# Patient Record
Sex: Female | Born: 1945 | Race: Black or African American | Hispanic: No | Smoking: Never smoker
Health system: Southern US, Community
[De-identification: ages and names within clinical notes are randomized; demographics above are authoritative.]

## PROBLEM LIST (undated history)

## (undated) DIAGNOSIS — M5136 Other intervertebral disc degeneration, lumbar region: Secondary | ICD-10-CM

## (undated) DIAGNOSIS — E785 Hyperlipidemia, unspecified: Secondary | ICD-10-CM

## (undated) DIAGNOSIS — M51369 Other intervertebral disc degeneration, lumbar region without mention of lumbar back pain or lower extremity pain: Secondary | ICD-10-CM

## (undated) DIAGNOSIS — J40 Bronchitis, not specified as acute or chronic: Secondary | ICD-10-CM

## (undated) DIAGNOSIS — R112 Nausea with vomiting, unspecified: Secondary | ICD-10-CM

## (undated) DIAGNOSIS — Z8719 Personal history of other diseases of the digestive system: Secondary | ICD-10-CM

## (undated) DIAGNOSIS — I2510 Atherosclerotic heart disease of native coronary artery without angina pectoris: Secondary | ICD-10-CM

## (undated) DIAGNOSIS — S86011A Strain of right Achilles tendon, initial encounter: Secondary | ICD-10-CM

## (undated) DIAGNOSIS — K219 Gastro-esophageal reflux disease without esophagitis: Secondary | ICD-10-CM

## (undated) DIAGNOSIS — N201 Calculus of ureter: Secondary | ICD-10-CM

## (undated) DIAGNOSIS — Z8679 Personal history of other diseases of the circulatory system: Secondary | ICD-10-CM

## (undated) DIAGNOSIS — Z9889 Other specified postprocedural states: Secondary | ICD-10-CM

## (undated) DIAGNOSIS — Q446 Cystic disease of liver: Secondary | ICD-10-CM

## (undated) DIAGNOSIS — N200 Calculus of kidney: Secondary | ICD-10-CM

## (undated) DIAGNOSIS — E119 Type 2 diabetes mellitus without complications: Secondary | ICD-10-CM

## (undated) DIAGNOSIS — M1990 Unspecified osteoarthritis, unspecified site: Secondary | ICD-10-CM

## (undated) DIAGNOSIS — I10 Essential (primary) hypertension: Secondary | ICD-10-CM

## (undated) DIAGNOSIS — M5416 Radiculopathy, lumbar region: Secondary | ICD-10-CM

## (undated) HISTORY — PX: CARDIAC CATHETERIZATION: SHX172

## (undated) HISTORY — DX: Unspecified osteoarthritis, unspecified site: M19.90

## (undated) HISTORY — DX: Atherosclerotic heart disease of native coronary artery without angina pectoris: I25.10

## (undated) HISTORY — PX: CARDIOVASCULAR STRESS TEST: SHX262

---

## 1976-03-21 HISTORY — PX: TOTAL ABDOMINAL HYSTERECTOMY W/ BILATERAL SALPINGOOPHORECTOMY: SHX83

## 1998-08-08 ENCOUNTER — Emergency Department (HOSPITAL_COMMUNITY): Admission: EM | Admit: 1998-08-08 | Discharge: 1998-08-08 | Payer: Self-pay | Admitting: *Deleted

## 1998-08-08 ENCOUNTER — Encounter: Payer: Self-pay | Admitting: *Deleted

## 1998-12-18 ENCOUNTER — Encounter: Payer: Self-pay | Admitting: Emergency Medicine

## 1998-12-18 ENCOUNTER — Inpatient Hospital Stay (HOSPITAL_COMMUNITY): Admission: EM | Admit: 1998-12-18 | Discharge: 1998-12-23 | Payer: Self-pay | Admitting: Emergency Medicine

## 1998-12-19 ENCOUNTER — Encounter: Payer: Self-pay | Admitting: Internal Medicine

## 1999-01-02 ENCOUNTER — Encounter: Payer: Self-pay | Admitting: Emergency Medicine

## 1999-01-02 ENCOUNTER — Emergency Department (HOSPITAL_COMMUNITY): Admission: EM | Admit: 1999-01-02 | Discharge: 1999-01-02 | Payer: Self-pay | Admitting: Emergency Medicine

## 1999-01-03 ENCOUNTER — Encounter: Payer: Self-pay | Admitting: Family Medicine

## 1999-01-19 ENCOUNTER — Inpatient Hospital Stay (HOSPITAL_COMMUNITY): Admission: AD | Admit: 1999-01-19 | Discharge: 1999-01-19 | Payer: Self-pay | Admitting: *Deleted

## 1999-01-20 ENCOUNTER — Emergency Department (HOSPITAL_COMMUNITY): Admission: EM | Admit: 1999-01-20 | Discharge: 1999-01-20 | Payer: Self-pay | Admitting: *Deleted

## 1999-10-27 ENCOUNTER — Encounter: Admission: RE | Admit: 1999-10-27 | Discharge: 1999-11-23 | Payer: Self-pay | Admitting: Family Medicine

## 1999-11-05 ENCOUNTER — Encounter: Payer: Self-pay | Admitting: Family Medicine

## 1999-11-05 ENCOUNTER — Inpatient Hospital Stay (HOSPITAL_COMMUNITY): Admission: AD | Admit: 1999-11-05 | Discharge: 1999-11-09 | Payer: Self-pay | Admitting: Family Medicine

## 1999-11-08 ENCOUNTER — Encounter: Payer: Self-pay | Admitting: Family Medicine

## 2000-03-25 ENCOUNTER — Encounter: Payer: Self-pay | Admitting: Emergency Medicine

## 2000-03-25 ENCOUNTER — Emergency Department (HOSPITAL_COMMUNITY): Admission: EM | Admit: 2000-03-25 | Discharge: 2000-03-25 | Payer: Self-pay | Admitting: Emergency Medicine

## 2001-01-21 ENCOUNTER — Encounter: Payer: Self-pay | Admitting: Emergency Medicine

## 2001-01-21 ENCOUNTER — Emergency Department (HOSPITAL_COMMUNITY): Admission: EM | Admit: 2001-01-21 | Discharge: 2001-01-21 | Payer: Self-pay | Admitting: *Deleted

## 2001-03-21 HISTORY — PX: CATARACT EXTRACTION W/ INTRAOCULAR LENS IMPLANT: SHX1309

## 2001-03-21 HISTORY — PX: HEEL SPUR SURGERY: SHX665

## 2001-04-22 ENCOUNTER — Inpatient Hospital Stay (HOSPITAL_COMMUNITY): Admission: EM | Admit: 2001-04-22 | Discharge: 2001-04-25 | Payer: Self-pay | Admitting: *Deleted

## 2001-04-22 ENCOUNTER — Encounter: Payer: Self-pay | Admitting: Internal Medicine

## 2001-04-25 ENCOUNTER — Encounter: Payer: Self-pay | Admitting: Internal Medicine

## 2002-05-28 ENCOUNTER — Encounter: Payer: Self-pay | Admitting: Family Medicine

## 2002-05-28 ENCOUNTER — Ambulatory Visit (HOSPITAL_COMMUNITY): Admission: RE | Admit: 2002-05-28 | Discharge: 2002-05-28 | Payer: Self-pay | Admitting: Family Medicine

## 2003-02-13 ENCOUNTER — Emergency Department (HOSPITAL_COMMUNITY): Admission: EM | Admit: 2003-02-13 | Discharge: 2003-02-13 | Payer: Self-pay | Admitting: Emergency Medicine

## 2004-02-06 ENCOUNTER — Ambulatory Visit: Payer: Self-pay | Admitting: Family Medicine

## 2004-05-06 ENCOUNTER — Emergency Department (HOSPITAL_COMMUNITY): Admission: EM | Admit: 2004-05-06 | Discharge: 2004-05-06 | Payer: Self-pay | Admitting: Emergency Medicine

## 2005-01-04 ENCOUNTER — Ambulatory Visit: Payer: Self-pay | Admitting: Family Medicine

## 2005-01-06 ENCOUNTER — Encounter: Admission: RE | Admit: 2005-01-06 | Discharge: 2005-01-06 | Payer: Self-pay | Admitting: Family Medicine

## 2005-01-13 ENCOUNTER — Encounter: Admission: RE | Admit: 2005-01-13 | Discharge: 2005-01-13 | Payer: Self-pay | Admitting: Orthopaedic Surgery

## 2005-10-13 ENCOUNTER — Ambulatory Visit: Payer: Self-pay | Admitting: Cardiology

## 2005-10-14 ENCOUNTER — Encounter: Payer: Self-pay | Admitting: Vascular Surgery

## 2005-10-14 ENCOUNTER — Inpatient Hospital Stay (HOSPITAL_COMMUNITY): Admission: EM | Admit: 2005-10-14 | Discharge: 2005-10-18 | Payer: Self-pay | Admitting: Family Medicine

## 2005-10-14 ENCOUNTER — Encounter: Payer: Self-pay | Admitting: Cardiology

## 2007-09-24 ENCOUNTER — Encounter (INDEPENDENT_AMBULATORY_CARE_PROVIDER_SITE_OTHER): Payer: Self-pay | Admitting: *Deleted

## 2009-07-14 ENCOUNTER — Inpatient Hospital Stay (HOSPITAL_COMMUNITY): Admission: AD | Admit: 2009-07-14 | Discharge: 2009-07-14 | Payer: Self-pay | Admitting: Obstetrics and Gynecology

## 2009-11-09 ENCOUNTER — Observation Stay (HOSPITAL_COMMUNITY): Admission: EM | Admit: 2009-11-09 | Discharge: 2009-11-11 | Payer: Self-pay | Admitting: Emergency Medicine

## 2009-11-10 ENCOUNTER — Ambulatory Visit: Payer: Self-pay | Admitting: Vascular Surgery

## 2009-11-10 ENCOUNTER — Encounter (INDEPENDENT_AMBULATORY_CARE_PROVIDER_SITE_OTHER): Payer: Self-pay | Admitting: Internal Medicine

## 2009-11-12 ENCOUNTER — Encounter: Payer: Self-pay | Admitting: Family Medicine

## 2010-02-07 ENCOUNTER — Emergency Department (HOSPITAL_COMMUNITY): Admission: EM | Admit: 2010-02-07 | Discharge: 2010-02-07 | Payer: Self-pay | Admitting: Family Medicine

## 2010-02-09 ENCOUNTER — Emergency Department (HOSPITAL_COMMUNITY): Admission: EM | Admit: 2010-02-09 | Discharge: 2010-02-09 | Payer: Self-pay | Admitting: Emergency Medicine

## 2010-02-26 ENCOUNTER — Emergency Department (HOSPITAL_COMMUNITY)
Admission: EM | Admit: 2010-02-26 | Discharge: 2010-02-26 | Payer: Self-pay | Source: Home / Self Care | Admitting: Emergency Medicine

## 2010-04-20 NOTE — Letter (Signed)
Summary: Encounter Notice/[facility]  Encounter [facility]   Imported By: Lanelle Bal 11/19/2009 12:17:08  _____________________________________________________________________  External Attachment:    Type:   Image     Comment:   External Document

## 2010-06-01 LAB — DIFFERENTIAL
Basophils Absolute: 0.1 10*3/uL (ref 0.0–0.1)
Basophils Relative: 1 % (ref 0–1)
Eosinophils Absolute: 0.2 10*3/uL (ref 0.0–0.7)
Eosinophils Relative: 3 % (ref 0–5)
Lymphocytes Relative: 53 % — ABNORMAL HIGH (ref 12–46)
Lymphs Abs: 3.9 10*3/uL (ref 0.7–4.0)
Monocytes Absolute: 0.5 10*3/uL (ref 0.1–1.0)
Monocytes Relative: 7 % (ref 3–12)
Neutro Abs: 2.6 10*3/uL (ref 1.7–7.7)
Neutrophils Relative %: 36 % — ABNORMAL LOW (ref 43–77)

## 2010-06-01 LAB — POCT I-STAT, CHEM 8
BUN: 12 mg/dL (ref 6–23)
Calcium, Ion: 1.15 mmol/L (ref 1.12–1.32)
Chloride: 110 mEq/L (ref 96–112)
Creatinine, Ser: 1.3 mg/dL — ABNORMAL HIGH (ref 0.4–1.2)
Glucose, Bld: 93 mg/dL (ref 70–99)
HCT: 40 % (ref 36.0–46.0)
Hemoglobin: 13.6 g/dL (ref 12.0–15.0)
Potassium: 3.7 mEq/L (ref 3.5–5.1)
Sodium: 143 mEq/L (ref 135–145)
TCO2: 22 mmol/L (ref 0–100)

## 2010-06-01 LAB — POCT CARDIAC MARKERS
CKMB, poc: 2.6 ng/mL (ref 1.0–8.0)
Myoglobin, poc: 128 ng/mL (ref 12–200)
Troponin i, poc: <0.05 ng/mL (ref 0.00–0.09)

## 2010-06-01 LAB — CBC
HCT: 37.9 % (ref 36.0–46.0)
Hemoglobin: 12.4 g/dL (ref 12.0–15.0)
MCH: 28.8 pg (ref 26.0–34.0)
MCHC: 32.8 g/dL (ref 30.0–36.0)
MCV: 87.6 fL (ref 78.0–100.0)
Platelets: 231 10*3/uL (ref 150–400)
RBC: 4.33 MIL/uL (ref 3.87–5.11)
RDW: 15.3 % (ref 11.5–15.5)
WBC: 7.2 10*3/uL (ref 4.0–10.5)

## 2010-06-04 LAB — POCT I-STAT, CHEM 8
BUN: 17 mg/dL (ref 6–23)
Calcium, Ion: 1.18 mmol/L (ref 1.12–1.32)
Chloride: 106 mEq/L (ref 96–112)
Creatinine, Ser: 1 mg/dL (ref 0.4–1.2)
Glucose, Bld: 77 mg/dL (ref 70–99)
HCT: 44 % (ref 36.0–46.0)
Hemoglobin: 15 g/dL (ref 12.0–15.0)
Potassium: 3.6 mEq/L (ref 3.5–5.1)
Sodium: 143 mEq/L (ref 135–145)
TCO2: 27 mmol/L (ref 0–100)

## 2010-06-04 LAB — DIFFERENTIAL
Basophils Absolute: 0.1 10*3/uL (ref 0.0–0.1)
Basophils Relative: 1 % (ref 0–1)
Eosinophils Absolute: 0.3 10*3/uL (ref 0.0–0.7)
Eosinophils Relative: 4 % (ref 0–5)
Lymphocytes Relative: 48 % — ABNORMAL HIGH (ref 12–46)
Lymphs Abs: 3.4 10*3/uL (ref 0.7–4.0)
Monocytes Absolute: 0.5 10*3/uL (ref 0.1–1.0)
Monocytes Relative: 7 % (ref 3–12)
Neutro Abs: 3 10*3/uL (ref 1.7–7.7)
Neutrophils Relative %: 41 % — ABNORMAL LOW (ref 43–77)

## 2010-06-04 LAB — CARDIAC PANEL(CRET KIN+CKTOT+MB+TROPI)
CK, MB: 2.6 ng/mL (ref 0.3–4.0)
CK, MB: 3.1 ng/mL (ref 0.3–4.0)
CK, MB: 3.5 ng/mL (ref 0.3–4.0)
Relative Index: 1.2 (ref 0.0–2.5)
Relative Index: 1.3 (ref 0.0–2.5)
Total CK: 262 U/L — ABNORMAL HIGH (ref 7–177)
Total CK: 280 U/L — ABNORMAL HIGH (ref 7–177)
Troponin I: 0.02 ng/mL (ref 0.00–0.06)
Troponin I: 0.03 ng/mL (ref 0.00–0.06)

## 2010-06-04 LAB — CBC
HCT: 40.4 % (ref 36.0–46.0)
Hemoglobin: 13.6 g/dL (ref 12.0–15.0)
MCH: 29.5 pg (ref 26.0–34.0)
MCHC: 33.6 g/dL (ref 30.0–36.0)
MCV: 87.9 fL (ref 78.0–100.0)
MCV: 88.2 fL (ref 78.0–100.0)
Platelets: 170 10*3/uL (ref 150–400)
Platelets: 181 10*3/uL (ref 150–400)
Platelets: 212 10*3/uL (ref 150–400)
RBC: 4.6 MIL/uL (ref 3.87–5.11)
RDW: 15.1 % (ref 11.5–15.5)
RDW: 15.2 % (ref 11.5–15.5)
RDW: 15.6 % — ABNORMAL HIGH (ref 11.5–15.5)
WBC: 5.7 10*3/uL (ref 4.0–10.5)
WBC: 5.9 10*3/uL (ref 4.0–10.5)
WBC: 7.3 10*3/uL (ref 4.0–10.5)

## 2010-06-04 LAB — POCT CARDIAC MARKERS
CKMB, poc: 2 ng/mL (ref 1.0–8.0)
Myoglobin, poc: 88.5 ng/mL (ref 12–200)
Troponin i, poc: <0.05 ng/mL (ref 0.00–0.09)

## 2010-06-04 LAB — PROTIME-INR
INR: 1.07 (ref 0.00–1.49)
Prothrombin Time: 14.1 seconds (ref 11.6–15.2)

## 2010-06-04 LAB — COMPREHENSIVE METABOLIC PANEL
Albumin: 3.5 g/dL (ref 3.5–5.2)
Alkaline Phosphatase: 66 U/L (ref 39–117)
BUN: 11 mg/dL (ref 6–23)
GFR calc Af Amer: >60 mL/min (ref >60)
Potassium: 3.6 mEq/L (ref 3.5–5.1)
Sodium: 142 mEq/L (ref 135–145)
Total Protein: 6.5 g/dL (ref 6.0–8.3)

## 2010-06-04 LAB — BASIC METABOLIC PANEL
BUN: 14 mg/dL (ref 6–23)
Creatinine, Ser: 0.81 mg/dL (ref 0.4–1.2)
GFR calc non Af Amer: >60 mL/min (ref >60)

## 2010-06-04 LAB — URINE CULTURE
Colony Count: 10000
Culture  Setup Time: 201108240037
Special Requests: NEGATIVE

## 2010-06-04 LAB — URINALYSIS, ROUTINE W REFLEX MICROSCOPIC
Bilirubin Urine: NEGATIVE
Hgb urine dipstick: NEGATIVE
Specific Gravity, Urine: 1.025 (ref 1.005–1.030)
Urobilinogen, UA: 0.2 mg/dL (ref 0.0–1.0)
pH: 5.5 (ref 5.0–8.0)

## 2010-06-04 LAB — LIPID PANEL
LDL Cholesterol: 142 mg/dL — ABNORMAL HIGH (ref 0–99)
Total CHOL/HDL Ratio: 5.2 RATIO
VLDL: 30 mg/dL (ref 0–40)

## 2010-06-04 LAB — TSH: TSH: 3.185 u[IU]/mL (ref 0.350–4.500)

## 2010-06-08 LAB — URINALYSIS, ROUTINE W REFLEX MICROSCOPIC
Bilirubin Urine: NEGATIVE
Nitrite: NEGATIVE
Protein, ur: NEGATIVE mg/dL
Specific Gravity, Urine: >1.03 — ABNORMAL HIGH (ref 1.005–1.030)
Urobilinogen, UA: 0.2 mg/dL (ref 0.0–1.0)

## 2010-07-15 ENCOUNTER — Other Ambulatory Visit: Payer: Self-pay | Admitting: Internal Medicine

## 2010-07-15 DIAGNOSIS — Z1231 Encounter for screening mammogram for malignant neoplasm of breast: Secondary | ICD-10-CM

## 2010-07-31 ENCOUNTER — Emergency Department (HOSPITAL_COMMUNITY): Payer: BC Managed Care – PPO

## 2010-07-31 ENCOUNTER — Observation Stay (HOSPITAL_COMMUNITY)
Admission: EM | Admit: 2010-07-31 | Discharge: 2010-08-02 | Disposition: A | Discharge status: Home / Self Care | Payer: BC Managed Care – PPO | Attending: Internal Medicine | Admitting: Internal Medicine

## 2010-07-31 DIAGNOSIS — K7689 Other specified diseases of liver: Secondary | ICD-10-CM | POA: Insufficient documentation

## 2010-07-31 DIAGNOSIS — M545 Low back pain, unspecified: Principal | ICD-10-CM | POA: Insufficient documentation

## 2010-07-31 DIAGNOSIS — R1013 Epigastric pain: Secondary | ICD-10-CM | POA: Insufficient documentation

## 2010-07-31 DIAGNOSIS — M5126 Other intervertebral disc displacement, lumbar region: Secondary | ICD-10-CM | POA: Insufficient documentation

## 2010-07-31 DIAGNOSIS — E785 Hyperlipidemia, unspecified: Secondary | ICD-10-CM | POA: Insufficient documentation

## 2010-07-31 DIAGNOSIS — N200 Calculus of kidney: Secondary | ICD-10-CM | POA: Insufficient documentation

## 2010-07-31 DIAGNOSIS — I10 Essential (primary) hypertension: Secondary | ICD-10-CM | POA: Insufficient documentation

## 2010-07-31 DIAGNOSIS — R209 Unspecified disturbances of skin sensation: Secondary | ICD-10-CM | POA: Insufficient documentation

## 2010-07-31 DIAGNOSIS — M129 Arthropathy, unspecified: Secondary | ICD-10-CM | POA: Insufficient documentation

## 2010-07-31 DIAGNOSIS — I059 Rheumatic mitral valve disease, unspecified: Secondary | ICD-10-CM | POA: Insufficient documentation

## 2010-07-31 DIAGNOSIS — E669 Obesity, unspecified: Secondary | ICD-10-CM | POA: Insufficient documentation

## 2010-07-31 DIAGNOSIS — R0789 Other chest pain: Secondary | ICD-10-CM | POA: Insufficient documentation

## 2010-07-31 LAB — DIFFERENTIAL
Basophils Absolute: 0 10*3/uL (ref 0.0–0.1)
Basophils Relative: 0 % (ref 0–1)
Eosinophils Absolute: 0.1 10*3/uL (ref 0.0–0.7)
Monocytes Absolute: 0.3 10*3/uL (ref 0.1–1.0)
Neutro Abs: 1.9 10*3/uL (ref 1.7–7.7)
Neutrophils Relative %: 39 % — ABNORMAL LOW (ref 43–77)

## 2010-07-31 LAB — URINALYSIS, ROUTINE W REFLEX MICROSCOPIC
Bilirubin Urine: NEGATIVE
Glucose, UA: NEGATIVE mg/dL
Hgb urine dipstick: NEGATIVE
Ketones, ur: NEGATIVE mg/dL
Protein, ur: NEGATIVE mg/dL
Urobilinogen, UA: 0.2 mg/dL (ref 0.0–1.0)

## 2010-07-31 LAB — CK TOTAL AND CKMB (NOT AT ARMC)
CK, MB: 3.7 ng/mL (ref 0.3–4.0)
Total CK: 383 U/L — ABNORMAL HIGH (ref 7–177)

## 2010-07-31 LAB — BASIC METABOLIC PANEL
CO2: 27 mEq/L (ref 19–32)
Calcium: 8.9 mg/dL (ref 8.4–10.5)
GFR calc Af Amer: >60 mL/min (ref >60)
GFR calc non Af Amer: >60 mL/min (ref >60)
Sodium: 140 mEq/L (ref 135–145)

## 2010-07-31 LAB — POCT CARDIAC MARKERS
CKMB, poc: 3.2 ng/mL (ref 1.0–8.0)
Myoglobin, poc: 132 ng/mL (ref 12–200)
Troponin i, poc: <0.05 ng/mL (ref 0.00–0.09)

## 2010-07-31 LAB — CBC
Hemoglobin: 13.4 g/dL (ref 12.0–15.0)
MCHC: 32.8 g/dL (ref 30.0–36.0)
Platelets: 218 10*3/uL (ref 150–400)

## 2010-08-01 ENCOUNTER — Observation Stay (HOSPITAL_COMMUNITY): Payer: BC Managed Care – PPO

## 2010-08-01 LAB — LIPID PANEL
Cholesterol: 219 mg/dL — ABNORMAL HIGH (ref 0–200)
HDL: 47 mg/dL (ref >39)
Triglycerides: 79 mg/dL (ref <150)
VLDL: 16 mg/dL (ref 0–40)

## 2010-08-01 LAB — CBC
HCT: 38.6 % (ref 36.0–46.0)
MCH: 28.1 pg (ref 26.0–34.0)
MCV: 87.3 fL (ref 78.0–100.0)
Platelets: 201 10*3/uL (ref 150–400)
RDW: 14.6 % (ref 11.5–15.5)
WBC: 5.5 10*3/uL (ref 4.0–10.5)

## 2010-08-01 LAB — CARDIAC PANEL(CRET KIN+CKTOT+MB+TROPI)
CK, MB: 3.5 ng/mL (ref 0.3–4.0)
Relative Index: 1.2 (ref 0.0–2.5)

## 2010-08-01 LAB — BASIC METABOLIC PANEL
BUN: 13 mg/dL (ref 6–23)
CO2: 24 mEq/L (ref 19–32)
Calcium: 8.7 mg/dL (ref 8.4–10.5)
Chloride: 104 mEq/L (ref 96–112)
Creatinine, Ser: 0.64 mg/dL (ref 0.4–1.2)
GFR calc Af Amer: >60 mL/min (ref >60)

## 2010-08-01 LAB — TSH: TSH: 4.674 u[IU]/mL — ABNORMAL HIGH (ref 0.350–4.500)

## 2010-08-02 NOTE — H&P (Signed)
NAME:  Brenda Flores, Brenda Flores       ACCOUNT NO.:  [id number]  MEDICAL RECORD NO.:  [id number]           PATIENT TYPE:  O  LOCATION:  1404                         FACILITY:  [facility]  PHYSICIAN:  Della Goo, M.D. DATE OF BIRTH:  1945/11/26  DATE OF ADMISSION:  07/31/2010 DATE OF DISCHARGE:                             HISTORY & PHYSICAL   DATE OF ADMISSION:  07/31/2010  PRIMARY CARE PHYSICIAN:  Robyn N. Allyne Gee, M.D.  CHIEF COMPLAINT:  Low back pain, left leg weakness and numbness.  HISTORY OF PRESENT ILLNESS:  This is a 65 year old female who presents to the emergency department secondary to complaints of 2 weeks of low back pain with pain radiating down the left lower extremity that has been worsening.  The patient also reports having numbness especially in the thigh area.  The patient denies having any history of trauma and  denies having any previous back injuries.   She also has complaints of chest pain and epigastric abdominal pain which occurred today prior to her arrival.  She denies having any fevers, chills, nausea, vomiting. Her chest pain is in the left side of her chest. There  is no radiation of the pain.  The pain is not associated nausea, vomiting or diaphoresis.  The pain, however, has been associated with some exertion.  PAST MEDICAL HISTORY:  Significant for hypertension, mitral valve prolapse, hyperlipidemia, arthritis and morbid obesity.  PAST SURGICAL HISTORY:  History of hysterectomy and left heel surgery for plantar fasciitis.  MEDICATIONS:  Her medications include Norvasc 5 mg 1 p.o. daily.  ALLERGIES: Intolerance to DARVOCET and DARVON causing nausea and vomiting.  SOCIAL HISTORY:  The patient is a widow for 9 years.  She has 2 daughters who are healthy.  She has one son who died at the age of 18 when he was murdered.  She is a nonsmoker, nondrinker.  No history of illicit drug usage.  FAMILY HISTORY:  Positive for coronary artery disease in 2  maternal aunts.  Positive for diabetes in one maternal aunt.  No hypertension in her family and cancer in both parents.  Father had prostate cancer. Mother had lung and breast cancer.  REVIEW OF SYSTEMS:  Pertinents as mentioned above.  PHYSICAL EXAMINATION FINDINGS:  GENERAL:  This is a morbidly obese 7- year-old African-American female who is in discomfort but no acute distress. VITAL SIGNS:  Temperature 98.4, blood pressure 171/94 initially, heart rate 75, respirations 18, O2 sats 96% to 100%. HEENT EXAMINATION:  Normocephalic, atraumatic.  Pupils equally round and reactive to light.  Extraocular movements are intact.  Funduscopic benign.  There is no scleral icterus.  Nares are patent bilaterally. Oropharynx is clear. NECK:  Supple.  Full range of motion.  No thyromegaly, adenopathy, or jugular venous distention. CARDIOVASCULAR:  Regular rate and rhythm.  No murmurs, gallops or rubs appreciated. CHEST:  Chest wall normal excursion which is symmetric.  Clear to auscultation bilaterally.  No rales, rhonchi or wheezes.  There is no palpable chest wall tenderness. ABDOMEN:  Positive bowel sounds, soft, nontender, nondistended.  No hepatosplenomegaly. EXTREMITIES: Without cyanosis, clubbing or edema. NEUROLOGIC EXAMINATION:  Nonfocal except she does have weakness in the left  lower extremity.  The patient is alert and oriented x3.  Sensation is intact but she does have a subjective complaint of numbness.  Gait has not been assessed, however, attempt was made to stand the patient up.  The patient could not bear her weight on the left leg and it had to be supported.  LABORATORY STUDIES:  White blood cell count 4.9, hemoglobin 13.4, hematocrit 40.9, platelets 218, neutrophils 39%, lymphocytes 52%. Sodium 140, potassium 3.7, chloride 105, CO2 of 27, BUN 15, creatinine 0.87, glucose 106.  Point-of-care cardiac markers, myoglobin of 132, CK- MB 3.2, troponin less than 0.05.   Point-of-care markers set #2, myoglobin 125, CK-MB 2.5, and troponin less than 0.05.  Urinalysis negative.  Chest x-ray reveals no acute disease process.  CT scan of the abdomen and pelvis revealed multiple cysts of both lobes of the liver.  ASSESSMENT:  A 65 year old female being admitted with: 1. Intractable back pain with sciatica at left lower extremity. 2. Chest pain. 3. Epigastric abdominal pain. 4. Multiple cysts in both lobes of the liver. 5. Hypertension. 6. History of mitral valve prolapse. 7. Morbid obesity.  PLAN:  The patient will be admitted to 23-hour observation to a telemetry bed.  Cardiac enzymes will continue to be performed.  The patient will be placed on pain control therapy and Protonix has also been ordered as well for the abdominal discomfort.  An MRI study of the lumbar and sacral spine has been ordered to evaluate for a spinal/vertebral pathology.  The patient was placed on DVT prophylaxis at this time and the patient is a full code.  Further workup of the findings on the CT scan of the abdomen and pelvis will be left to primary rounding team to pursue this as an inpatient versus an outpatient evaluation.  Please also note the patient had a 2-D echo performed this past week by her primary care physician.     Della Goo, M.D.     HJ/MEDQ  D:  08/01/2010  T:  08/01/2010  Job:  [id number]  cc:   Candyce Churn. Allyne Gee, M.D. Fax: [phone]  Electronically Signed by Della Goo M.D. on 08/02/2010 03:35:57 AM

## 2010-08-06 NOTE — Cardiovascular Report (Signed)
NAME:  TORIN, WHISNER NO.:  [id number]   MEDICAL RECORD NO.:  [id number]          PATIENT TYPE:  INP   LOCATION:  4728                         FACILITY:  [facility]   PHYSICIAN:  Micheline Chapman, MD   DATE OF BIRTH:  1946/03/01   DATE OF PROCEDURE:  10/17/2005  DATE OF DISCHARGE:                              CARDIAC CATHETERIZATION   PERFORMING PHYSICIAN:  Dr. Tonny Bollman   This case was proctored by Dr. Shawnie Pons.   INDICATIONS:  Ms. Ciampa is a very pleasant 65 year old woman with  hypertension and hyperlipidemia who presented with new onset chest pain and  was referred for a left heart catheterization and coronary angiography to  rule out significant coronary artery disease.   PROCEDURE:  1.  Left heart catheterization.  2.  Coronary angiography.  3.  Left ventricular angiogram.   Risks and indications explained to the patient who fully understood.  Informed consent was obtained.  The right groin was prepped and draped in  normal sterile fashion.  Right groin anesthetized with 1% lidocaine and  right femoral arterial access was obtained using the modified Seldinger  technique and a 5-French arterial sheath was placed in the right femoral  artery.  Catheters used included a 5-French angled pigtail catheter, 5-  [place] JL4, and 5French JR4.   FINDINGS:  Aortic pressure was 147/78.  Left ventricular pressure was 150  systolic with an LVEDP of 6 mmHg.  Left ventricular end-diastolic pressure  of 6 mmHg.   Left coronary artery angiography showed a normal left main stem.  The  proximal LAD was normal.  There was a 30-40% lesion present in the mid LAD  in the area of the first diagonal.  The distal LAD was normal.  There was  one large diagonal that appeared angiographically normal.  The left  circumflex system was dominant.  There was a large caliber first obtuse  marginal that was 4 mm or greater in diameter and normal in appearance.  It  bifurcated into twin vessels.  The AV groove circumflex further branched  into a lower obtuse marginal that was of medium caliber and had no  angiographic disease.  The remainder of the left circumflex system was also  free of disease.  The right coronary artery was a medium diameter vessel.  It gives off a small PDA and at most is codominant.  There is no significant  posterior AV segment.  There are two RV marginal branches that have no  significant coronary artery disease.   Left ventricular angiography demonstrated normal systolic function with an  estimated ejection fraction in the range of 65%.   ASSESSMENT:  1.  Non-obstructive coronary artery disease.  2.  Normal left ventricular systolic and diastolic function.   PLAN:  1.  Medical therapy for hypertension and dyslipidemia.  2.  Daily aspirin in the setting of mild coronary artery disease.      Micheline Chapman, MD  Electronically Signed     MDC/MEDQ  D:  10/17/2005  T:  10/18/2005  Job:  [id number]   cc:   Marne A. Milinda Antis, M.D. [facility]  Maisie Fus D. Riley Kill,  M.D. [facility]

## 2010-08-06 NOTE — Consult Note (Signed)
[place]. [facility]  Patient:    Brenda Flores, Brenda Flores Visit Number: [id number] MRN: [id number]          Service Type: MED Location: [phone] Attending Physician:  Carrie Mew Dictated by:   Madolyn Frieze. Jens Som, M.D. [facility] Proc. Date: 04/23/01 Admit Date:  04/22/2001                            Consultation Report  [name] is a 65 year old female with a past medical history of hypertension and mitral valve prolapse who we are asked to evaluate for chest pain.  The patient apparently has seen Dr. [name] in the past.  She has had catheterizations three times by her report, the last being approximately 15 years ago.  She states that there was no obstructive disease found.  The patient states that she awoke on April 22, 2001 with substernal chest pain that was described initially as a sharp sensation followed by a heaviness. There was also numbness in her left upper extremity.  She states she was unable to lift her left upper extremity.  There was associated shortness of breath and nausea, but no vomiting.  She states there was diaphoresis.  The pain did increase with inspiration and also with certain movements but it was not related to food and not positional.  The pain was continuous for most of the day yesterday until she finally came to the emergency room.  She did have some relief with nitroglycerin but then the pain returned.  She was admitted and has ruled out for myocardial infarction with serial enzymes.  Cardiology is asked to further evaluate.  Of note, she denies any recent travel, leg trauma, or hemoptysis.  PAST MEDICAL HISTORY:  Significant for hypertension and hyperlipidemia but there is no diabetes mellitus.  She is status post hysterectomy as well as tonsillectomy.  She has a history of asthma.  She has a history of mitral valve prolapse by report.  FAMILY HISTORY:  Positive for coronary artery disease as well as  cancer in her mother and father.  SOCIAL HISTORY:  She denies any alcohol or tobacco use.  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  ALLERGIES:  DARVON and DARVOCET.  REVIEW OF SYSTEMS:  She denies any headaches at present or fevers or chills. There is no productive cough or hemoptysis.  There is no dysphagia, odynophagia, melena, or hematochezia.  There is no dysuria or hematuria. There is no rash or seizure activity.  There is occasional orthopnea, but no PND.  She has had pedal edema in the left lower extremity by report, but has had a negative work-up by Dr. Milinda Antis by report.  The remaining systems are negative.  PHYSICAL EXAMINATION  VITAL SIGNS:  Blood pressure 137/83, pulse 62.  She is afebrile.  Respiratory rate 16.  GENERAL:  She is well-developed and somewhat obese.  She is in no acute distress.  SKIN:  Warm and dry.  HEENT:  Unremarkable with normal eyelids.  NECK:  Supple with normal upstroke bilaterally and no bruits noted.  There is no jugular venous distention noted.  No thyromegaly.  CHEST:  Clear to auscultation.  Normal expansion.  CARDIOVASCULAR:  Regular rate and rhythm with normal S1 and S2.  There are no gallops noted and no murmurs noted.  ABDOMEN:  Not tender, standard positive bowel sounds.  No hepatosplenomegaly. No masses appreciated.  There is no abdominal bruit.  She has 2+ femoral pulses bilaterally and no bruits.  EXTREMITIES:  No edema.  I can palpate no cords.  She has a negative Homans. She has 2+ dorsalis pedis pulses bilaterally.  NEUROLOGIC:  Grossly intact.  LABORATORIES:  Electrocardiogram shows nonreactive with nonspecific ST changes.  Chest CT is pending.  Enzymes are negative x3.  BUN and creatinine are 12 and 0.8 with sodium 145.  DIAGNOSES: 1. Atypical chest pain. 2. History of mitral valve prolapse. 3. Hypertension. 4. History of hyperlipidemia.  PLAN:  [name] presents for evaluation of chest pain.  Her  symptoms are atypical in that they increase with inspiration at times and also with certain movements.  This may represent a musculoskeletal etiology.  We will await the results of her chest CT.  If it shows no pulmonary embolus, then we will plan to proceed with a rest stress Cardiolite for risk stratification. If it shows no ischemia then I think we do not need to pursue further cardiac work-up.  I agree with continuing risk factor modification including control of her blood pressure as well as her lipids. Dictated by:   Madolyn Frieze. Jens Som, M.D. [facility] Attending Physician:  Carrie Mew DD:  04/23/01 TD:  04/23/01 Job: [id number]

## 2010-08-06 NOTE — Discharge Summary (Signed)
NAME:  Brenda Flores, Brenda Flores NO.:  [id number]   MEDICAL RECORD NO.:  [id number]          PATIENT TYPE:  INP   LOCATION:  4728                         FACILITY:  [facility]   PHYSICIAN:  Micheline Chapman, MD   DATE OF BIRTH:  [birth year]   DATE OF ADMISSION:  10/14/2005  DATE OF DISCHARGE:  10/18/2005                                 DISCHARGE SUMMARY   PROCEDURES:  1.  Cardiac catheterization.  2.  Coronary arteriogram.  3.  Left ventriculogram.  4.  MRI of the lumbar spine with and without contrast.   PRIMARY DIAGNOSIS:  Chest pain.   SECONDARY DIAGNOSES:  1.  Left lower extremity pain.  2.  Hypertension.  3.  Hyperlipidemia with a total cholesterol 231, triglycerides 157, HDL 38,      LDL 162.  4.  Reported history of mitral valve prolapse.  5.  Status post hysterectomy and resection of heel spur.  6.  Asthma.  7.  Insomnia.  8.  Allergy or intolerance to Darvocet.   Time at discharge 37 minutes.   HOSPITAL COURSE:  Ms. Brenda Flores is a 65 year old female with no previous  history of coronary artery disease. She had some left leg pain which was  waxing and waning and then had onset of chest pain on July 22. She had a  recurrent episode on July 25, and then on July 26 she had recurrent symptoms  and went to urgent care. She was significantly hypertensive with systolic  greater than 200 and was referred to the emergency room where she was  evaluated and admitted.   She had some elevation in her CKs, but her MBs and troponins were all  negative. It was felt that because of her cardiac risk factors including  obesity and poorly controlled hypertension as well as hyperlipidemia she  needed a cardiac catheterization. This was performed on October 17, 2005.   EF was 65% with no wall motion abnormalities. She had a 30-40% LAD stenosis  with some other luminal irregularities and small caliber RCA. She had no  obstructive disease, and Dr. Excell Seltzer evaluated the films and felt  that she  needed medical therapy for hypertension and dyslipidemia as well as adding  an aspirin to her medication regimen.   To evaluate her lower extremity pain an MRI was performed. She has had one  in the past, and it showed some disk protrusions and Dr. Milinda Antis was following  her. The MRI showed a small lateral disk protrusion on the left at L4-5  which could be affecting the left L4 nerve root which was new since the  prior MRI which had been performed in October 2006. She also had shallow  disk protrusion at L5-S1 with a small focal component in the lateral recess  which could be affecting the right S1 nerve root. It was felt that this was  progressive from the prior MRI. 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 She had normal  alignment and no fractures. She is to follow up with Dr. Milinda Antis on this.   On October 18, 2005 her right groin site was mildly tender, but there was no  hematoma. Distal pulses were intact. Her blood pressure control had improved  with a systolic between 914 and 150. Further medication adjustments can be  performed as an outpatient by Dr. Milinda Antis. She was evaluated by  Dr. Excell Seltzer and considered stable for discharge and is to follow up as an  outpatient with primary care.   DISCHARGE INSTRUCTIONS:  Activity level is to be limited for the next 48  hours with no tub baths or swimming for five days. She is to follow up with  [facility] on a p.r.n. basis. She is to call Dr. Milinda Antis for an  appointment. She is to get a fasting lipid profile and liver profile in 12  weeks.   DISCHARGE MEDICATIONS:  1.  Coated aspirin 81 mg a day.  2.  Zocor 80 mg 1/2 tab q.h.s.  3.  Norvasc 10 mg daily.  4.  Protonix 40 mg a day.  5.  Albuterol and Flovent MDIs as prior to admission.  6.  Ambien 10 mg q.h.s. p.r.n.      Theodore Demark, P.A. [facility]      Micheline Chapman, MD  Electronically Signed    RB/MEDQ  D:   10/18/2005  T:  10/18/2005  Job:  [id number]   cc:   Marne A. Milinda Antis, M.D. [facility]

## 2010-08-06 NOTE — Discharge Summary (Signed)
[place]. [facility]  Patient:    Brenda Flores, Brenda Flores Visit Number: [id number] MRN: [id number]          Service Type: MED Location: [phone] Attending Physician:  Carrie Mew Dictated by:   Cornell Barman, P.A. Admit Date:  04/22/2001 Discharge Date: 04/25/2001   CC:         [name], M.D. [facility]   Discharge Summary  DISCHARGE DIAGNOSES: 1. Atypical chest pain. 2. Myocardial infarction, ruled out. 3. Hyperlipidemia. 4. Gastroesophageal reflux disease. 5. Chest wall pain.  HISTORY OF PRESENT ILLNESS:  Brenda Flores is a 65 year old, African-American female with no known history of coronary disease.  The patient was admitted with chest pain.  She woke around 3 a.m. on the morning of admission with pain.  It eased off somewhat, but was still there.  It worsened later in the morning and again later in the evening.  She described the pain as 10/10 and described it as a heaviness.  It decreased after three sublingual nitroglycerin.  PAST MEDICAL HISTORY: 1. Normal coronaries by cardiac catheterization x3. 2. Hyperlipidemia. 3. Status post hysterectomy. 4. Hypertension. 5. Asthma.  HOSPITAL COURSE:  The patient was admitted to rule out myocardial infarction. Cardiac enzymes were negative.  Cardiology was asked to see the patient. Cardiology reviewed the history.  They noted that her chest pain had been relieved with morphine.  They noted all enzymes were negative.  They noted EKG was without ST changes.  CT of the chest was negative for pulmonary embolus. They decided to proceed with an adenosine Cardiolite.  This was negative. From this standpoint, the patient was felt to be stable for discharge.  DISCHARGE LABORATORY DATA AND X-RAY FINDINGS:  Hemoglobin 12.6, otherwise CBC was normal.  Coagulations were normal.  Glucose was mildly elevated at 112, otherwise LFTs were normal.  CK-MBs and troponins were negative.   Total cholesterol was elevated at 212, triglycerides 86, HDL 43, LDL elevated at 152.  DISCHARGE MEDICATIONS: 1. Vioxx 25 mg q.d. 2. Norvasc 5 mg p.o. q.d. 3. Flovent as at home. 4. Nasonex as at home. 5. Ambien as at home.  FOLLOWUP:  The patient should follow up with Dr. Milinda Antis and lipid lowering agent should be considered at that time. Dictated by:   Cornell Barman, P.A. Attending Physician:  Carrie Mew DD:  05/11/01 TD:  05/12/01 Job: [id number]

## 2010-08-06 NOTE — H&P (Signed)
NAME:  Brenda Flores, GOTTO NO.:  [id number]   MEDICAL RECORD NO.:  [id number]          PATIENT TYPE:  EMS   LOCATION:  MAJO                         FACILITY:  [facility]   PHYSICIAN:  Marrian Salvage. [name], M.D. [facility] OF BIRTH:  01/12/1946   DATE OF ADMISSION:  10/13/2005  DATE OF DISCHARGE:                                HISTORY & PHYSICAL   PRIMARY MEDICAL DOCTOR:  Dr. [name] at [facility]   CHIEF COMPLAINT:  His chest pain, leg pain, and other somatic complaints.   HISTORY OF PRESENT ILLNESS:  The patient's 65 year old female with history  of hypertension.  Overall, she has been relatively healthy with no recent  medical visits and no prior coronary disease.  She was in her usual state of  health until about two weeks ago when she initially noticed some left leg  pain mostly in the thigh which she described as a numbness and a toothache  sensation.  It has been waxing and waning since then.  She has actually had  this pain in the past and reportedly underwent an MRI of her lumbar spine  about four months ago.  Then on Sunday, July 22nd, the patient had new onset  of chest pain.  She described this as a heaviness and a weight on the  precordium in the middle of the chest associated with shortness of breath.  This occurred just after church on Sunday while she was cooking.  She laid  down and it resolved after about 30 minutes.  She was then fine until  Wednesday, July 25th, when around noon time she had recurrent chest  pressure.  At the same time she was having leg pain as well.  This pain  lasted briefly, but then recurred in the evening.  This time, the chest pain  was associated with numbness in the lower lip as well as some pain in the  posterior cervical spine region.  This time the pain lasted on and off all  night and then finally went away around 9:00 a.m. the following morning.  It  returned again tonight at 5:00 p.m. on July 26th.  She ultimately went to  Urgent Care.  There are blood pressure was high, but initial EKG was okay.  She was referred to the hospital due to the hypertension.  Here in the  emergency department, EKG and cardiac markers were negative.  Nitroglycerin  helps her pain, but she was also given some morphine.  Her blood pressure  decreased from 200/100 to 150/70 prior to receiving treatment.  She is  currently chest pain free, but does have some ongoing leg pain.   PAST MEDICAL HISTORY:  1.  Mitral valve prolapse, reportedly diagnosed by Dr. Daisy Flores on      catheterization 20 years ago.  2.  Hypertension diagnosed 20 years ago.  3.  Hysterectomy 15 years ago.  4.  Heel spur, status post resection five years ago.  5.  Lumbar disk disease diagnosed on MRI four months ago, no prior surgery.  6.  Asthma, mild in severity.  7.  Insomnia.   ALLERGIES:  DARVOCET.   CURRENT MEDICATIONS:  1.  Norvasc 5 mg daily.  2.  Flovent 110 mcg inhaler one puff b.i.d.  3.  Albuterol as needed.  4.  Ambien 10 mg nightly as needed for sleep.   SOCIAL HISTORY:  The patient lives in [place] alone.  She has been widowed  for the last three years.  She has three children, son and daughter in  [place] and another daughter in [place].  She works as a Interior and spatial designer for  [employer]  She smoked for only two years and quit 30 years ago.  She does not  drink alcohol and denied use of illicit drugs.  She does walk daily and has  had stable exercise tolerance.  She says she has decreased to walking little  bit the last two weeks due to her leg pain.   FAMILY HISTORY:  Mother died at age 59 of breast cancer.  Father died at age  60 of prostate cancer.  She has two brothers and one sister are alive and  well.  She has three aunts and two uncles as well as a grandfather all  having coronary disease what it sounds like in their 48s.   REVIEW OF SYSTEMS:  Positive for urinary frequency, numbness in the lip and  right leg, lower back pain, chest pain,  and shortness of breath.  Otherwise,  14 system review is negative in detail.   ADVANCED DIRECTIVES:  Full code.   PHYSICAL EXAMINATION:  VITAL SIGNS:  Temperature 97.9, pulse 70,  respirations 18, blood pressure initially 205/107 and then 150/95 on repeat  prior to treatment.  Saturation 98% on room air.  GENERAL:  The patient is in no distress, conversant.  No rash.  HEENT:  She had poor dentition, but oral mucosa is normal.  NECK:  JVP flat.  No carotid bruits.  Thyroid without enlargement.  LUNGS:  Clear to auscultation bilaterally.  HEART:  Regular rate and rhythm with normal S1-S2.  She had a soft murmur at  the left upper sternal border.  CHEST:  The chest wall is nontender.  ABDOMEN:  Soft, nontender, normal bowel sounds.  Right groin has a 2+ pulse  with no bruit.  EXTREMITIES:  Her left leg has decreased sensation on  palpation relatively throughout the leg.  DTRs in the left knee is 2+.  NEUROLOGICAL:  Otherwise she is appropriate and intact.   Chest x-ray pending.   EKG reveals rate of 69, rhythm sinus rhythm with left leftward axis, normal  intervals, no Q waves, no ST-T changes.  No EKG comparison.   LABORATORY:  White blood cell count 7, hematocrit 43, platelets 236,000.  Sodium 142, potassium 3.7, bicarbonate 28, creatinine 0.8, glucose 92. CK  126, MB 3.7, troponin negative.  D-dimer 0.33.  BNP negative 30.   IMPRESSION:  A 51-year female with multiple somatic complaints including  atypical chest pain and hypertension.   PLAN:  1.  Atypical chest pain.  I think it is very unlikely this is cardiac in      origin.  She has multiple unusual features and is associated with some      numbness.  She does have some risk factors primarily hypertension.  At      this point she will get serial cardiac markers.  She will be on      telemetry.  She will get aspirin and nitroglycerin as needed.  I have     held beta blocker for now as we are hoping to stress her  relatively  soon      and she is low risk.  Will also hold ACE inhibitor on short-term given      low risk for infarct.  Will control blood pressure with a other      medications.  She should get a stress test tomorrow.  She is able to      walk, but I am concerned that she will not be able to perform on a      treadmill due to her left leg issues.  She does have mild asthma making      adenosine also potentially problematic.  If needed, she can get a      dobutamine maybe.  I would defer to the stress lab.  I would also her      start on proton pump inhibitor twice a day to see if this is related to      GERD.  2.  Hypertension.  She comes in quite hypertensive, but this was in the      setting of pain.  With resolution of the pain, her blood pressure has      come down.  However, she is still not at goal. Will follow her in house.      I have increased her Norvasc to 10.  If she needs another medication and      has a negative workup for cardiac disease I think the next best addition      would be hydrochlorothiazide 12.5.  She should further follow up in the      outpatient setting.  3.  Left leg pain and numbness.  This does not sound related to a DVT and      she has a negative D-dimer.  It seems like she has known radiculopathy      and her symptoms correspond with that.  Will give her pain treatment as      needed.  She should have her MRI reviewed and outpatient follow-up.   DISPOSITION:  Hopefully stress and home tomorrow.  She is being admitted as  an observation.           ______________________________  Marrian Salvage [name], M.D. [facility]     LAA/MEDQ  D:  10/14/2005  T:  10/14/2005  Job:  [id number]   cc:   Marne A. [name], M.D. [facility]  [address], [place]  [place] [address]

## 2010-08-11 NOTE — Discharge Summary (Signed)
NAME:  Brenda Flores, INES NO.:  [id number]  MEDICAL RECORD NO.:  [id number]           PATIENT TYPE:  O  LOCATION:  1404                         [facility]ACILITY[facility]  PHYSICIAN:  Gwenyth Bender, NP      DATE OF BIRTH:  14-Jan-1946  DATE OF ADMISSION:  07/31/2010 DATE OF DISCHARGE:  08/02/2010                        DISCHARGE SUMMARY - REFERRING   PRIMARY CARE PROVIDER:  Dorothyann Peng, MD  DISCHARGE DIAGNOSES: 1. Intractable back pain with left lower extremity numbness. 2. Atypical chest pain. 3. Hypertension.  DISCHARGE MEDICATIONS: 1. Zofran 4 mg p.o. every 6 hours as needed for nausea/vomiting. 2. Oxycodone 5 mg IR p.o. every 4 hours as needed for pain. 3. Albuterol inhaler 2 puffs every 6 hours as needed for wheezing. 4. Amlodipine 5 mg p.o. daily. 5. Tylenol Extra Strength 500 mg 2 tablets p.o. every 6 hours as     needed for pain.  DIAGNOSTICS:  WBC is 4.9, hemoglobin 13.4, hematocrit 40.9, platelets 218.  Sodium 140, potassium 3.7, chloride 105, CO2 27, BUN 15, creatinine 0.87, glucose 108.  CK-MB 3.2, troponin-I less than 0.05, myoglobin 132.  Urinalysis was negative.  Second set of cardiac markers, CK-MB 2.5, troponin-I less than 0.05, myoglobin 125.  Third set, total CK 383, CK-MB 3.7, troponin-I less than 0.30.  TSH 4.67.  RADIOLOGY: 1. CT abdomen/pelvis yields a nonobstructive 3 mm lower pole left     renal calculus.  No obstructing ureteral calculi or hydronephrosis.     Multiple cystic lesions of the liver appeared benign.  Some     interval growth since previous report.  Mild atherosclerotic     changes. 2. MRI of the lumbar spine without contrast yields shallow broad-based     lateral foraminal and extraforaminal disk protrusion on the left at     L3-4.  Disk protrusion on the left appears to contact the left L3     nerve root extraforaminally.  Shallow broad-based right medial     foraminal disk protrusion at L5-S1.  Foraminal disk protrusion   could potentially irritate the right L5 nerve root. 3. MRI of the sacrum without contrast yields normal MR appearance of     the sacrum and SI joints.  No sacral stress or insufficiency     fracture.  CONSULTATIONS:  None.  PROCEDURES:  None.  BRIEF SUMMARY:  Brenda Flores is a 65 year old female who presented to the [facility] Emergency Room on May 12 complaining of low back pain that had been going on for 2 weeks.  She indicated the pain radiated down the left lower extremity and has gotten worse over the last couple of days.  She also reported having some numbness especially in the thigh.  She denied any trauma or having previous back injuries.  In addition, she complained of chest pain as well as epigastric abdominal pain which occurred on May 12 just prior to her arrival.  The patient was admitted with chest pain and intractable back pain with sciatica of the left lower extremity.  HOSPITAL COURSE: 1. Intractable back pain with left lower extremity numbness.  The     patient was admitted.  Pain control via  oxycodone p.o. was     provided.  The patient had an MRI that yielded a bulging disk at L3-     4 and L5-S1.  At the time of discharge, pain is within tolerable     limits and intermittent.  The patient is to follow up with primary     care provider, Dr. Allyne Gee.  Dr. Allyne Gee and the patient will     review the situation and determine if neurosurgical consult would     be of benefit on an outpatient basis. 2. Atypical chest pain:  Resolved.  Acute coronary syndrome ruled out.     Cardiac enzymes were negative.  No EKG changes.  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     The patient to follow up with Dr. Allyne Gee to determine outpatient     stress test schedule. 3. Hypertension:  Fair control on Norvasc 5 mg daily.  Follow up with     primary care provider for any adjustments.  PHYSICAL EXAM:  VITAL SIGNS:  Temp 97.8, blood  pressure 167/73, heart rate 69, respiration 17, saturations 97% on room air. GENERAL:  Awake, alert, well-nourished, well-hydrated, in no acute distress. CARDIOVASCULAR:  Regular rate and rhythm.  No murmur, gallop or rub. Trace lower extremity edema. RESPIRATORY:  No increased work of breathing.  Breath sounds clear to auscultation bilaterally.  No rhonchi, wheezes or rales. ABDOMEN:  Obese, soft.  Positive bowel sounds throughout.  Abdomen nontender to palpation.  No mass or organomegaly noted.  NEUROLOGIC: Alert and oriented x3.  Speech clear.  Facial symmetry. MUSCULOSKELETAL:  Moves all extremities.  Full range of motion. Bilateral lower extremity strength 5/5.  Sensation intact.  FOLLOWUP:  The patient has an appointment with Dr. Dorothyann Peng on May 16.  Will need followup evaluation of low back pain and possible neurosurgical referral as well as followup on atypical chest pain and stress test schedule.  CONDITION ON DISCHARGE:  The patient is medically ready to be discharged home.  Time spent on discharge, 30 minutes.  Dictated For:  Brenda Pitt, MD     Gwenyth Bender, NP     KMB/MEDQ  D:  08/02/2010  T:  08/02/2010  Job:  [id number]  cc:   Candyce Churn. Allyne Gee, M.D. Fax: [phone]  Electronically Signed by Toya Smothers  on 08/11/2010 04:49:40 PM

## 2011-01-02 ENCOUNTER — Emergency Department (HOSPITAL_COMMUNITY): Payer: BC Managed Care – PPO

## 2011-01-02 ENCOUNTER — Emergency Department (HOSPITAL_COMMUNITY)
Admission: EM | Admit: 2011-01-02 | Discharge: 2011-01-02 | Disposition: A | Discharge status: Home / Self Care | Payer: BC Managed Care – PPO | Attending: Emergency Medicine | Admitting: Emergency Medicine

## 2011-01-02 DIAGNOSIS — Z79899 Other long term (current) drug therapy: Secondary | ICD-10-CM | POA: Insufficient documentation

## 2011-01-02 DIAGNOSIS — R079 Chest pain, unspecified: Secondary | ICD-10-CM | POA: Insufficient documentation

## 2011-01-02 DIAGNOSIS — R42 Dizziness and giddiness: Secondary | ICD-10-CM | POA: Insufficient documentation

## 2011-01-02 DIAGNOSIS — R51 Headache: Secondary | ICD-10-CM | POA: Insufficient documentation

## 2011-01-02 DIAGNOSIS — E7800 Pure hypercholesterolemia, unspecified: Secondary | ICD-10-CM | POA: Insufficient documentation

## 2011-01-02 DIAGNOSIS — R110 Nausea: Secondary | ICD-10-CM | POA: Insufficient documentation

## 2011-01-02 DIAGNOSIS — I10 Essential (primary) hypertension: Secondary | ICD-10-CM | POA: Insufficient documentation

## 2011-01-02 DIAGNOSIS — M129 Arthropathy, unspecified: Secondary | ICD-10-CM | POA: Insufficient documentation

## 2011-01-02 LAB — DIFFERENTIAL
Lymphocytes Relative: 44 % (ref 12–46)
Lymphs Abs: 3.2 10*3/uL (ref 0.7–4.0)
Monocytes Relative: 8 % (ref 3–12)
Neutrophils Relative %: 45 % (ref 43–77)

## 2011-01-02 LAB — CBC
HCT: 40.9 % (ref 36.0–46.0)
Hemoglobin: 13.7 g/dL (ref 12.0–15.0)
MCH: 28.8 pg (ref 26.0–34.0)
MCV: 86.1 fL (ref 78.0–100.0)
RBC: 4.75 MIL/uL (ref 3.87–5.11)
WBC: 7.2 10*3/uL (ref 4.0–10.5)

## 2011-01-02 LAB — BASIC METABOLIC PANEL
CO2: 27 mEq/L (ref 19–32)
Chloride: 105 mEq/L (ref 96–112)
GFR calc non Af Amer: 87 mL/min — ABNORMAL LOW (ref >90)
Glucose, Bld: 105 mg/dL — ABNORMAL HIGH (ref 70–99)
Potassium: 3.5 mEq/L (ref 3.5–5.1)
Sodium: 143 mEq/L (ref 135–145)

## 2011-01-02 LAB — POCT I-STAT TROPONIN I: Troponin i, poc: 0 ng/mL (ref 0.00–0.08)

## 2011-02-17 ENCOUNTER — Other Ambulatory Visit: Payer: Self-pay | Admitting: Gastroenterology

## 2011-02-17 DIAGNOSIS — R1033 Periumbilical pain: Secondary | ICD-10-CM

## 2011-02-17 DIAGNOSIS — N949 Unspecified condition associated with female genital organs and menstrual cycle: Secondary | ICD-10-CM

## 2011-02-22 ENCOUNTER — Ambulatory Visit
Admission: RE | Admit: 2011-02-22 | Discharge: 2011-02-22 | Disposition: A | Discharge status: Home / Self Care | Payer: BC Managed Care – PPO | Source: Ambulatory Visit | Attending: Gastroenterology | Admitting: Gastroenterology

## 2011-02-22 DIAGNOSIS — R1033 Periumbilical pain: Secondary | ICD-10-CM

## 2011-02-22 DIAGNOSIS — N949 Unspecified condition associated with female genital organs and menstrual cycle: Secondary | ICD-10-CM

## 2011-02-22 MED ORDER — IOHEXOL 300 MG/ML  SOLN
125.0000 mL | Freq: Once | INTRAMUSCULAR | Status: AC | PRN
  Administered 2011-02-22: 125 mL via INTRAVENOUS

## 2011-03-17 HISTORY — PX: COLONOSCOPY W/ POLYPECTOMY: SHX1380

## 2011-04-06 ENCOUNTER — Emergency Department (HOSPITAL_COMMUNITY): Payer: BC Managed Care – PPO

## 2011-04-06 ENCOUNTER — Other Ambulatory Visit: Payer: Self-pay

## 2011-04-06 ENCOUNTER — Inpatient Hospital Stay (HOSPITAL_COMMUNITY)
Admission: EM | Admit: 2011-04-06 | Discharge: 2011-04-11 | DRG: 143 | Disposition: A | Discharge status: Home / Self Care | Payer: BC Managed Care – PPO | Source: Ambulatory Visit | Attending: Internal Medicine | Admitting: Internal Medicine

## 2011-04-06 ENCOUNTER — Encounter (HOSPITAL_COMMUNITY): Payer: Self-pay

## 2011-04-06 DIAGNOSIS — M51379 Other intervertebral disc degeneration, lumbosacral region without mention of lumbar back pain or lower extremity pain: Secondary | ICD-10-CM | POA: Diagnosis present

## 2011-04-06 DIAGNOSIS — I5030 Unspecified diastolic (congestive) heart failure: Secondary | ICD-10-CM

## 2011-04-06 DIAGNOSIS — IMO0002 Reserved for concepts with insufficient information to code with codable children: Secondary | ICD-10-CM | POA: Diagnosis present

## 2011-04-06 DIAGNOSIS — M25569 Pain in unspecified knee: Secondary | ICD-10-CM

## 2011-04-06 DIAGNOSIS — M1990 Unspecified osteoarthritis, unspecified site: Secondary | ICD-10-CM | POA: Diagnosis present

## 2011-04-06 DIAGNOSIS — M5416 Radiculopathy, lumbar region: Secondary | ICD-10-CM

## 2011-04-06 DIAGNOSIS — M519 Unspecified thoracic, thoracolumbar and lumbosacral intervertebral disc disorder: Secondary | ICD-10-CM

## 2011-04-06 DIAGNOSIS — R0789 Other chest pain: Principal | ICD-10-CM | POA: Diagnosis present

## 2011-04-06 DIAGNOSIS — R51 Headache: Secondary | ICD-10-CM | POA: Diagnosis present

## 2011-04-06 DIAGNOSIS — I10 Essential (primary) hypertension: Secondary | ICD-10-CM | POA: Diagnosis present

## 2011-04-06 DIAGNOSIS — R079 Chest pain, unspecified: Secondary | ICD-10-CM | POA: Diagnosis present

## 2011-04-06 DIAGNOSIS — M79605 Pain in left leg: Secondary | ICD-10-CM | POA: Diagnosis present

## 2011-04-06 DIAGNOSIS — M1710 Unilateral primary osteoarthritis, unspecified knee: Secondary | ICD-10-CM | POA: Diagnosis present

## 2011-04-06 DIAGNOSIS — M5137 Other intervertebral disc degeneration, lumbosacral region: Secondary | ICD-10-CM | POA: Diagnosis present

## 2011-04-06 DIAGNOSIS — I519 Heart disease, unspecified: Secondary | ICD-10-CM | POA: Diagnosis present

## 2011-04-06 DIAGNOSIS — I2510 Atherosclerotic heart disease of native coronary artery without angina pectoris: Secondary | ICD-10-CM | POA: Diagnosis present

## 2011-04-06 DIAGNOSIS — R519 Headache, unspecified: Secondary | ICD-10-CM | POA: Diagnosis present

## 2011-04-06 DIAGNOSIS — E785 Hyperlipidemia, unspecified: Secondary | ICD-10-CM | POA: Diagnosis present

## 2011-04-06 DIAGNOSIS — M1712 Unilateral primary osteoarthritis, left knee: Secondary | ICD-10-CM

## 2011-04-06 DIAGNOSIS — I169 Hypertensive crisis, unspecified: Secondary | ICD-10-CM

## 2011-04-06 DIAGNOSIS — R0600 Dyspnea, unspecified: Secondary | ICD-10-CM

## 2011-04-06 HISTORY — DX: Essential (primary) hypertension: I10

## 2011-04-06 LAB — CBC
HCT: 42.1 % (ref 36.0–46.0)
HCT: 38.8 % (ref 36.0–46.0)
MCV: 85.5 fL (ref 78.0–100.0)
Platelets: 213 10*3/uL (ref 150–400)
RBC: 4.91 MIL/uL (ref 3.87–5.11)
RBC: 4.54 MIL/uL (ref 3.87–5.11)
RDW: 14.4 % (ref 11.5–15.5)
RDW: 14.5 % (ref 11.5–15.5)
WBC: 7 10*3/uL (ref 4.0–10.5)
WBC: 6.5 10*3/uL (ref 4.0–10.5)

## 2011-04-06 LAB — BASIC METABOLIC PANEL
CO2: 28 mEq/L (ref 19–32)
Chloride: 103 mEq/L (ref 96–112)
Sodium: 141 mEq/L (ref 135–145)

## 2011-04-06 LAB — DIFFERENTIAL
Basophils Absolute: 0 10*3/uL (ref 0.0–0.1)
Lymphocytes Relative: 49 % — ABNORMAL HIGH (ref 12–46)
Neutro Abs: 2.8 10*3/uL (ref 1.7–7.7)

## 2011-04-06 LAB — POCT I-STAT TROPONIN I: Troponin i, poc: 0 ng/mL (ref 0.00–0.08)

## 2011-04-06 LAB — MRSA PCR SCREENING: MRSA by PCR: NEGATIVE

## 2011-04-06 LAB — D-DIMER, QUANTITATIVE: D-Dimer, Quant: 0.49 ug/mL-FEU — ABNORMAL HIGH (ref 0.00–0.48)

## 2011-04-06 LAB — CREATININE, SERUM
GFR calc Af Amer: >90 mL/min (ref >90)
GFR calc non Af Amer: 88 mL/min — ABNORMAL LOW (ref >90)

## 2011-04-06 LAB — CARDIAC PANEL(CRET KIN+CKTOT+MB+TROPI)
CK, MB: 2.6 ng/mL (ref 0.3–4.0)
Total CK: 142 U/L (ref 7–177)
Troponin I: <0.3 ng/mL (ref <0.30)

## 2011-04-06 MED ORDER — ASPIRIN EC 325 MG PO TBEC
325.0000 mg | DELAYED_RELEASE_TABLET | Freq: Every day | ORAL | Status: DC
  Administered 2011-04-06 – 2011-04-07 (×2): 325 mg via ORAL
  Filled 2011-04-06 (×3): qty 1

## 2011-04-06 MED ORDER — SIMVASTATIN 10 MG PO TABS
10.0000 mg | ORAL_TABLET | Freq: Every day | ORAL | Status: DC
  Administered 2011-04-07 – 2011-04-10 (×4): 10 mg via ORAL
  Filled 2011-04-06 (×6): qty 1

## 2011-04-06 MED ORDER — CALCIUM CARBONATE 1250 (500 CA) MG PO TABS
1.0000 | ORAL_TABLET | Freq: Every day | ORAL | Status: DC
  Administered 2011-04-06 – 2011-04-11 (×6): 500 mg via ORAL
  Filled 2011-04-06 (×6): qty 1

## 2011-04-06 MED ORDER — ALUM & MAG HYDROXIDE-SIMETH 200-200-20 MG/5ML PO SUSP: 30.0000 mL | Freq: Four times a day (QID) | ORAL | Status: DC | PRN

## 2011-04-06 MED ORDER — ONDANSETRON HCL 4 MG PO TABS: 4.0000 mg | ORAL_TABLET | Freq: Four times a day (QID) | ORAL | Status: DC | PRN

## 2011-04-06 MED ORDER — IOHEXOL 300 MG/ML  SOLN
100.0000 mL | Freq: Once | INTRAMUSCULAR | Status: AC | PRN
  Administered 2011-04-06: 100 mL via INTRAVENOUS

## 2011-04-06 MED ORDER — MORPHINE SULFATE 4 MG/ML IJ SOLN
4.0000 mg | Freq: Once | INTRAMUSCULAR | Status: AC
  Administered 2011-04-06: 4 mg via INTRAVENOUS
  Filled 2011-04-06: qty 1

## 2011-04-06 MED ORDER — MORPHINE SULFATE 2 MG/ML IJ SOLN
1.0000 mg | INTRAMUSCULAR | Status: DC | PRN
  Administered 2011-04-07 – 2011-04-09 (×3): 1 mg via INTRAVENOUS
  Filled 2011-04-06 (×3): qty 1

## 2011-04-06 MED ORDER — ACETAMINOPHEN 650 MG RE SUPP: 650.0000 mg | Freq: Four times a day (QID) | RECTAL | Status: DC | PRN

## 2011-04-06 MED ORDER — ENOXAPARIN SODIUM 40 MG/0.4ML ~~LOC~~ SOLN
40.0000 mg | SUBCUTANEOUS | Status: DC
  Administered 2011-04-06: 40 mg via SUBCUTANEOUS
  Filled 2011-04-06 (×2): qty 0.4

## 2011-04-06 MED ORDER — ALBUTEROL SULFATE (5 MG/ML) 0.5% IN NEBU: 2.5000 mg | INHALATION_SOLUTION | RESPIRATORY_TRACT | Status: DC | PRN

## 2011-04-06 MED ORDER — AMLODIPINE BESYLATE 5 MG PO TABS
5.0000 mg | ORAL_TABLET | Freq: Every day | ORAL | Status: DC
  Administered 2011-04-06 – 2011-04-07 (×2): 5 mg via ORAL
  Filled 2011-04-06 (×2): qty 1

## 2011-04-06 MED ORDER — SENNA 8.6 MG PO TABS
1.0000 | ORAL_TABLET | Freq: Two times a day (BID) | ORAL | Status: DC
  Administered 2011-04-07 – 2011-04-11 (×9): 8.6 mg via ORAL
  Filled 2011-04-06 (×11): qty 1

## 2011-04-06 MED ORDER — DOCUSATE SODIUM 100 MG PO CAPS
100.0000 mg | ORAL_CAPSULE | Freq: Two times a day (BID) | ORAL | Status: DC
  Administered 2011-04-07 – 2011-04-11 (×9): 100 mg via ORAL
  Filled 2011-04-06 (×11): qty 1

## 2011-04-06 MED ORDER — HYDROCODONE-ACETAMINOPHEN 5-325 MG PO TABS
1.0000 | ORAL_TABLET | ORAL | Status: DC | PRN
  Administered 2011-04-07 (×2): 2 via ORAL
  Administered 2011-04-07 (×2): 1 via ORAL
  Administered 2011-04-08 – 2011-04-11 (×9): 2 via ORAL
  Filled 2011-04-06 (×12): qty 2
  Filled 2011-04-06: qty 1

## 2011-04-06 MED ORDER — KETOROLAC TROMETHAMINE 30 MG/ML IJ SOLN
30.0000 mg | Freq: Once | INTRAMUSCULAR | Status: AC
  Administered 2011-04-06: 30 mg via INTRAVENOUS
  Filled 2011-04-06: qty 1

## 2011-04-06 MED ORDER — ASPIRIN 81 MG PO CHEW
324.0000 mg | CHEWABLE_TABLET | Freq: Once | ORAL | Status: AC
  Administered 2011-04-06: 324 mg via ORAL
  Filled 2011-04-06: qty 4

## 2011-04-06 MED ORDER — ACETAMINOPHEN 325 MG PO TABS: 650.0000 mg | ORAL_TABLET | Freq: Four times a day (QID) | ORAL | Status: DC | PRN

## 2011-04-06 MED ORDER — SODIUM CHLORIDE 0.9 % IV SOLN
INTRAVENOUS | Status: DC
  Administered 2011-04-06: 21:00:00 via INTRAVENOUS

## 2011-04-06 MED ORDER — NITROGLYCERIN IN D5W 200-5 MCG/ML-% IV SOLN
INTRAVENOUS | Status: AC
  Filled 2011-04-06: qty 250

## 2011-04-06 MED ORDER — VITAMIN D3 25 MCG (1000 UNIT) PO TABS
1000.0000 [IU] | ORAL_TABLET | Freq: Every day | ORAL | Status: DC
  Administered 2011-04-06 – 2011-04-11 (×6): 1000 [IU] via ORAL
  Filled 2011-04-06 (×6): qty 1

## 2011-04-06 MED ORDER — ZOLPIDEM TARTRATE 5 MG PO TABS
5.0000 mg | ORAL_TABLET | Freq: Every evening | ORAL | Status: DC | PRN
  Administered 2011-04-08: 5 mg via ORAL
  Filled 2011-04-06: qty 1

## 2011-04-06 MED ORDER — PANTOPRAZOLE SODIUM 40 MG PO TBEC
40.0000 mg | DELAYED_RELEASE_TABLET | Freq: Every day | ORAL | Status: DC
  Administered 2011-04-06 – 2011-04-11 (×6): 40 mg via ORAL
  Filled 2011-04-06 (×4): qty 1

## 2011-04-06 MED ORDER — NITROGLYCERIN 0.4 MG SL SUBL
0.4000 mg | SUBLINGUAL_TABLET | SUBLINGUAL | Status: DC | PRN
  Administered 2011-04-06 (×2): 0.4 mg via SUBLINGUAL
  Filled 2011-04-06: qty 25

## 2011-04-06 MED ORDER — SODIUM CHLORIDE 0.9 % IV SOLN
INTRAVENOUS | Status: DC
  Administered 2011-04-06: 15:00:00 via INTRAVENOUS

## 2011-04-06 MED ORDER — ONDANSETRON HCL 4 MG/2ML IJ SOLN
4.0000 mg | Freq: Once | INTRAMUSCULAR | Status: AC
  Administered 2011-04-06: 4 mg via INTRAVENOUS
  Filled 2011-04-06: qty 2

## 2011-04-06 MED ORDER — ONDANSETRON HCL 4 MG/2ML IJ SOLN: 4.0000 mg | Freq: Four times a day (QID) | INTRAMUSCULAR | Status: DC | PRN

## 2011-04-06 MED ORDER — NITROGLYCERIN IN D5W 200-5 MCG/ML-% IV SOLN
2.0000 ug/min | INTRAVENOUS | Status: DC
  Administered 2011-04-06: 5 ug/min via INTRAVENOUS

## 2011-04-06 NOTE — ED Provider Notes (Addendum)
66 year old, female, complains of a frontal headache with nausea, but no vomiting.  She has no neck pain, fever, or rash.  She denies vision changes.  Her headache has been present since this morning.  It was a insidious onset and persistent.  She also complains of pain in her bilateral lower extremities.  It started in her left knee and has extended into the entire left leg.  She also has pain in her right knee.  She denies swelling.  She has not had recent trips or surgeries.  Generally, she is very ambulatory, female.  Finally, she also complains of heaviness in her left chest, which radiates into her left arm.  This also has been persistent throughout the day.  She was seen at an urgent care center in [place] and referred here for further evaluation.  She does not take a daily aspirin.  She denies smoking, diabetes, or prior history of coronary artery disease.  Her physical examination is normal.  Her EKG does not show any signs of cardiac ischemia.  Given her symptoms.  However, we will establish an IV and perform all evaluation for possible acute coronary syndrome.  Nicholes Stairs, MD 04/06/11 1358  ED ECG REPORT   Date: 04/06/2011  EKG Time: 2:00 PM  Rate: 77  Rhythm: normal sinus rhythm,    Axis: nl  Intervals:none  ST&T Change: nonspecific tw changes   Narrative Interpretation: nsr with lvh and nonspecific tw changes            Nicholes Stairs, MD 04/06/11 1401

## 2011-04-06 NOTE — ED Notes (Signed)
Patient now to x-ray

## 2011-04-06 NOTE — ED Notes (Signed)
Pt. Developed leg pain on Sunday while at church both legs, but her lt. Leg has increased in weakness.   Today she developed chest pain radiates into her lt. Arm.  Pt. Also has a severe headache which started yestterday

## 2011-04-06 NOTE — H&P (Signed)
PATIENT DETAILS Name: Brenda Flores Age: 66 y.o. Sex: female Date of Birth: 12-Dec-1945 Admit Date: 04/06/2011 RUE:AVWUJ [name], MD, MD   CHIEF COMPLAINT:  Chest pain, headache, left leg pain since this morning.  HPI: Patient is a 66 year old black female, with a past medical history of hypertension who presents to the ED today with the above-noted complaints. Per patient she claims that she woke up around 5:00 this morning with left-sided chest pain. Patient claims that the pain is sharp and heavy at times. He claims to have associated diaphoresis. She denies any palpitations. She denies any nausea and vomiting. She also claims that at the same time she has this frontal headache. Patient also claims to have pain behind her left knee area that is worse for the past 2-3 days, but has been going on for at least a week or so. During my evaluation patient seemed very comfortable, however late the pain around 7/10. Cardiac enzymes and EKG were unremarkable, a CT angiogram of the chest did not show any pulmonary embolism. It was also negative for any acute cardiopulmonary abnormalities. She was subsequently referred to the hospitalist service for further evaluation and treatment.  ALLERGIES:  No Known Allergies  PAST MEDICAL HISTORY: Past Medical History  Diagnosis Date  . Hypertension     PAST SURGICAL HISTORY: Past Surgical History  Procedure Date  . Back surgery     MEDICATIONS AT HOME: Prior to Admission medications   Medication Sig Start Date End Date Taking? Authorizing Provider  amLODipine (NORVASC) 5 MG tablet Take 5 mg by mouth daily.   Yes Historical Provider, MD  calcium carbonate (OS-CAL - DOSED IN MG OF ELEMENTAL CALCIUM) 1250 MG tablet Take 1 tablet by mouth daily.   Yes Historical Provider, MD  cholecalciferol (VITAMIN D) 1000 UNITS tablet Take 1,000 Units by mouth daily.   Yes Historical Provider, MD    FAMILY HISTORY: No family history on file.  SOCIAL  HISTORY:  reports that she has never smoked. She does not have any smokeless tobacco history on file. She reports that she does not drink alcohol or use illicit drugs.  REVIEW OF SYSTEMS:  Constitutional:   No  weight loss, night sweats,  Fevers, chills, fatigue.  HEENT:    No headaches, Difficulty swallowing,Tooth/dental problems,Sore throat,  No sneezing, itching, ear ache, nasal congestion, post nasal drip,   Cardio-vascular:   Orthopnea, PND, swelling in lower extremities, anasarca, dizziness, palpitations  GI:  No heartburn, indigestion, abdominal pain, nausea, vomiting, diarrhea, change in bowel habits, loss of appetite  Resp: No shortness of breath with exertion or at rest.  No excess mucus, no productive cough, No non-productive cough,  No coughing up of blood.No change in color of mucus.No wheezing.No chest wall deformity  Skin:  no rash or lesions.  GU:  no dysuria, change in color of urine, no urgency or frequency.  No flank pain.  Musculoskeletal: No joint pain or swelling.  No decreased range of motion.  No back pain.  Psych: No change in mood or affect. No depression or anxiety.  No memory loss.   PHYSICAL EXAM: Blood pressure 166/89, pulse 64, temperature 97.7 F (36.5 C), temperature source Oral, resp. rate 20, height 5\' 5"  (1.651 m), weight 91.627 kg (202 lb), SpO2 95.00%.  General appearance :Awake, alert, not in any distress. Speech Clear. Not toxic Looking HEENT: Atraumatic and Normocephalic, pupils equally reactive to light and accomodation Neck: supple, no JVD. No cervical lymphadenopathy.  Chest:Good air entry bilaterally,  no added sounds  CVS: S1 S2 regular, no murmurs.  Reproducible left-sided chest pain-on palpation Abdomen: Bowel sounds present, Non tender and not distended with no gaurding, rigidity or rebound. Extremities: B/L Lower Ext shows no edema, both legs are warm to touch, with  dorsalis pedis pulses palpable. there is some mild to  moderate tenderness behind the left knee area. However the left leg does not appear swollen or erythematous. Her left knee does not appear to have any effusion. The anterior aspect of the knee is negative for tenderness as well. Neurology: Awake alert, and oriented X 3, CN II-XII intact, Non focal, Deep Tendon Reflex-2+ all over, plantar's downgoing B/L, sensory exam is grossly intact.  Skin:No Rash Wounds:N/A  LABS ON ADMISSION:   Basename 04/06/11 1427  NA 141  K 4.1  CL 103  CO2 28  GLUCOSE 90  BUN 14  CREATININE 0.79  CALCIUM 9.5  MG --  PHOS --   No results found for this basename: AST:2,ALT:2,ALKPHOS:2,BILITOT:2,PROT:2,ALBUMIN:2 in the last 72 hours No results found for this basename: LIPASE:2,AMYLASE:2 in the last 72 hours  Basename 04/06/11 1427  WBC 7.0  NEUTROABS 2.8  HGB 14.0  HCT 42.1  MCV 85.7  PLT 213   No results found for this basename: CKTOTAL:3,CKMB:3,CKMBINDEX:3,TROPONINI:3 in the last 72 hours  Basename 04/06/11 1427  DDIMER 0.49*   No components found with this basename: POCBNP:3   RADIOLOGIC STUDIES ON ADMISSION: Dg Chest 2 View  04/06/2011  *RADIOLOGY REPORT*  Clinical Data: Left chest pain.  Shortness of breath.  CHEST - 2 VIEW  Comparison: 01/02/2011  Findings: Heart size remains within normal limits.  Both lungs are clear.  No evidence of pleural effusion.  No mass or lymphadenopathy identified.  No significant change compared to previous study.  Mild mid thoracic spine degenerative changes are again noted.  IMPRESSION: Stable exam.  No active disease.  Original Report Authenticated By: Danae Orleans, M.D.   Ct Head Wo Contrast  04/06/2011  *RADIOLOGY REPORT*  Clinical Data: Headache  CT HEAD WITHOUT CONTRAST  Technique:  Contiguous axial images were obtained from the base of the skull through the vertex without contrast.  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  No acute infarct.  Ventricle  size is normal.  Negative for hemorrhage or mass.  Dense calcification the choroid  plexus bilaterally is unchanged. Peripheral calcification in the region of the left transverse sinus is unchanged and appears chronic.  No acute bony abnormality.  IMPRESSION: No acute abnormality and no change from the  prior study.  Original Report Authenticated By: Camelia Phenes, M.D.   Ct Angio Chest W/cm &/or Wo Cm  04/06/2011  *RADIOLOGY REPORT*  Clinical Data: Dyspnea  CT ANGIOGRAPHY CHEST  Technique:  Multidetector CT imaging of the chest using the standard protocol during bolus administration of intravenous contrast. Multiplanar reconstructed images including MIPs were obtained and reviewed to evaluate the vascular anatomy.  Contrast: OMNIPAQUE IOHEXOL 300 MG/ML IV SOLN  Comparison: 11/09/2009  Findings: No enlarged supraclavicular lymph nodes.  No axillary adenopathy.  There is no mediastinal or hilar adenopathy identified.  There is no pericardial or pleural effusion.  No abnormal filling defects within the main pulmonary artery or their branches to suggest an acute pulmonary embolus.  Trachea appears patent and is midline.  Review of the visualized osseous structures is significant for mild multilevel spondylosis.  Imaging through the upper abdomen demonstrates multiple large  cysts within the both lobes of the liver.  Similar in appearance to previous examination.  IMPRESSION:  1. No evidence for acute pulmonary embolus. 2.  No acute cardiopulmonary abnormalities. 3.  Polycystic liver.  Original Report Authenticated By: Rosealee Albee, M.D.   Dg Knee Complete 4 Views Left  04/06/2011  *RADIOLOGY REPORT*  Clinical Data: Left knee pain and numbness.  No known injury.  LEFT KNEE - COMPLETE 4+ VIEW  Comparison: None.  Findings: The patient has mild tricompartmental osteoarthritis with a small joint effusion.  No fracture or dislocation or bone destruction.  IMPRESSION: Tricompartmental mild osteoarthritis.   Small joint effusion.  Original Report Authenticated By: Gwynn Burly, M.D.    ASSESSMENT AND PLAN: Present on Admission:  .Chest pain -Likely this is atypical, as she has some amount of tenderness in her left thoracic/chest wall area. 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 -At this point I would try a dose of Toradol to see if this would alleviate her pain. However given her high blood pressure, I will place her on a nitroglycerin infusion and admit her to a step down unit. Cardiac enzymes will continue to be cycled. A CT angiogram of the chest does not show a pulmonary embolism none show any acute cardiopulmonary abnormalities. We'll continue to monitor in the step down unit and further recommendations will follow depending on her clinical progress  .Hypertensive crisis -Continue on amlodipine, however she be placed on a nitroglycerin glycerin infusion as well.  Marland KitchenHeadache -Not sure whether this is a side effect of nitroglycerin she has received, but  She claims that she came in with a headache. A CT of the head is negative. We will try a dose of Toradol, if the headache is still persistent and perhaps only can do further imaging of her brain in terms of an MRI.  .Leg pain, left -On examination of the left leg, there is no swelling or erythema or increased warmth. Pain is mostly on the posterior aspect of the left and knee. Since the CT angiogram of test is negative, and because of the fact there is no local signs I actually doubt that this is from underlying DVT, I will however order a lower extremity venous Doppler.  Further plan will depend as patient's clinical course evolves and further radiologic and laboratory data become available. Patient will be monitored closely.   DVT Prophylaxis: -Lovenox  Code Status: -Full code   Total time spent for admission equals 45 minutes.  Jeoffrey Massed 04/06/2011,  8:38 PM

## 2011-04-06 NOTE — ED Notes (Signed)
[id number] Ready 

## 2011-04-06 NOTE — ED Provider Notes (Signed)
History     CSN: [id number]  Arrival date & time 04/06/11  1321   First MD Initiated Contact with Patient 04/06/11 1338      Chief Complaint  Patient presents with  . Chest Pain    (Consider location/radiation/quality/duration/timing/severity/associated sxs/prior treatment) Patient is a 66 y.o. female presenting with chest pain. The history is provided by the patient.  Chest Pain Primary symptoms include fatigue and shortness of breath. Pertinent negatives for primary symptoms include no fever and no cough.   patient states she's had pain in both of her knees since Sunday. He states it's under her left lower extremity altered. No fevers. She states that today she developed chest pain and increasing difficulty breathing. She states she went up a flight of stairs has to stop halfway up. The pain as pressure. It was left arm. She also has a headache. She states the headache is like that she's had high blood pressure before.no nausea vomiting.no trauma to her lower extremities.  Past Medical History  Diagnosis Date  . Hypertension     History reviewed. No pertinent past surgical history.  No family history on file.  History  Substance Use Topics  . Smoking status: Never Smoker   . Smokeless tobacco: Not on file  . Alcohol Use: No    OB History    Grav Para Term Preterm Abortions TAB SAB Ect Mult Living                  Review of Systems  Constitutional: Positive for fatigue. Negative for fever and chills.  Eyes: Negative for redness.  Respiratory: Positive for shortness of breath. Negative for cough.   Cardiovascular: Positive for chest pain.  Genitourinary: Negative for dysuria, frequency, hematuria and pelvic pain.  Musculoskeletal: Negative for back pain and joint swelling.  Skin: Negative for pallor and rash.  Neurological: Positive for headaches.  Hematological: Negative for adenopathy.  Psychiatric/Behavioral: Negative for confusion.    Allergies  Review of  patient's allergies indicates no known allergies.  Home Medications   Current Outpatient Rx  Name Route Sig Dispense Refill  . AMLODIPINE BESYLATE 5 MG PO TABS Oral Take 5 mg by mouth daily.    [name] CALCIUM CARBONATE 1250 MG PO TABS Oral Take 1 tablet by mouth daily.    [name] VITAMIN D 1000 UNITS PO TABS Oral Take 1,000 Units by mouth daily.      BP 160/87  Pulse 60  Temp(Src) 98.4 F (36.9 C) (Oral)  Resp 20  Ht 5\' 5"  (1.651 m)  Wt 202 lb (91.627 kg)  BMI 33.61 kg/m2  SpO2 99%  Physical Exam  Nursing note and vitals reviewed. Constitutional: She is oriented to person, place, and time. She appears well-developed and well-nourished.  HENT:  Head: Normocephalic and atraumatic.  Eyes: EOM are normal. Pupils are equal, round, and reactive to light.  Neck: Normal range of motion. Neck supple.  Cardiovascular: Normal rate, regular rhythm and normal heart sounds.   No murmur heard. Pulmonary/Chest: Effort normal and breath sounds normal. No respiratory distress. She has no wheezes. She has no rales.  Abdominal: Soft. Bowel sounds are normal. She exhibits no distension. There is no tenderness. There is no rebound and no guarding.  Musculoskeletal: Normal range of motion. She exhibits edema.       Extremity pitting edema worse than left. Mild fullness behind the left knee.  Neurological: She is alert and oriented to person, place, and time. No cranial nerve deficit.  Skin:  Skin is warm and dry.  Psychiatric: She has a normal mood and affect. Her speech is normal.    ED Course  Procedures (including critical care time)  Labs Reviewed  DIFFERENTIAL - Abnormal; Notable for the following:    Neutrophils Relative 41 (*)    Lymphocytes Relative 49 (*)    All other components within normal limits  BASIC METABOLIC PANEL - Abnormal; Notable for the following:    GFR calc non Af Amer 86 (*)    All other components within normal limits  D-DIMER, QUANTITATIVE - Abnormal; Notable for the  following:    D-Dimer, Quant 0.49 (*)    All other components within normal limits  CBC  POCT I-STAT TROPONIN I  I-STAT TROPONIN I   Dg Chest 2 View  04/06/2011  *RADIOLOGY REPORT*  Clinical Data: Left chest pain.  Shortness of breath.  CHEST - 2 VIEW  Comparison: 01/02/2011  Findings: Heart size remains within normal limits.  Both lungs are clear.  No evidence of pleural effusion.  No mass or lymphadenopathy identified.  No significant change compared to previous study.  Mild mid thoracic spine degenerative changes are again noted.  IMPRESSION: Stable exam.  No active disease.  Original Report Authenticated By: Danae Orleans, M.D.   Ct Head Wo Contrast  04/06/2011  *RADIOLOGY REPORT*  Clinical Data: Headache  CT HEAD WITHOUT CONTRAST  Technique:  Contiguous axial images were obtained from the base of the skull through the vertex without contrast.  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  No acute infarct.  Ventricle size is normal.  Negative for hemorrhage or mass.  Dense calcification the choroid  plexus bilaterally is unchanged. Peripheral calcification in the region of the left transverse sinus is unchanged and appears chronic.  No acute bony abnormality.  IMPRESSION: No acute abnormality and no change from the  prior study.  Original Report Authenticated By: Camelia Phenes, M.D.   Ct Angio Chest W/cm &/or Wo Cm  04/06/2011  *RADIOLOGY REPORT*  Clinical Data: Dyspnea  CT ANGIOGRAPHY CHEST  Technique:  Multidetector CT imaging of the chest using the standard protocol during bolus administration of intravenous contrast. Multiplanar reconstructed images including MIPs were obtained and reviewed to evaluate the vascular anatomy.  Contrast: OMNIPAQUE IOHEXOL 300 MG/ML IV SOLN  Comparison: 11/09/2009  Findings: No enlarged supraclavicular lymph nodes.  No axillary adenopathy.  There is no mediastinal or hilar adenopathy identified.  There is  no pericardial or pleural effusion.  No abnormal filling defects within the main pulmonary artery or their branches to suggest an acute pulmonary embolus.  Trachea appears patent and is midline.  Review of the visualized osseous structures is significant for mild multilevel spondylosis.  Imaging through the upper abdomen demonstrates multiple large cysts within the both lobes of the liver.  Similar in appearance to previous examination.  IMPRESSION:  1. No evidence for acute pulmonary embolus. 2.  No acute cardiopulmonary abnormalities. 3.  Polycystic liver.  Original Report Authenticated By: Rosealee Albee, M.D.   Dg Knee Complete 4 Views Left  04/06/2011  *RADIOLOGY REPORT*  Clinical Data: Left knee pain and numbness.  No known injury.  LEFT KNEE - COMPLETE 4+ VIEW  Comparison: None.  Findings: The patient has mild tricompartmental osteoarthritis with a small joint effusion.  No fracture or dislocation or bone destruction.  IMPRESSION: Tricompartmental mild osteoarthritis.  Small joint effusion.  Original Report Authenticated By: Allayne Gitelman.  MAXWELL, M.D.     1. Chest pain   2. Knee pain   3. Dyspnea     Date: 04/06/2011  Rate: 77  Rhythm: normal sinus rhythm  QRS Axis: normal  Intervals: normal  ST/T Wave abnormalities: normal  Conduction Disutrbances:LVH  Narrative Interpretation:   Old EKG Reviewed: unchanged     MDM  Chest pain, worse with exertion. Also headache. Left leg pain. Patient has had a positive d-dimer but negative CT angina. She'll be admitted to the hospital for further workup.        Juliet Rude. Rubin Payor, MD 04/06/11 1827

## 2011-04-06 NOTE — ED Notes (Addendum)
Pt CP 8/10, pt assigned to 4700, awaiting callf from Dr. Jerral Ralph,  pt in active CP unable to go to 4700

## 2011-04-06 NOTE — ED Notes (Addendum)
Pt CP 7/10 post 0.4mg  SL nitro, pt assigned to 4700, Dr. Jerral Ralph at bedside, pt in active CP unable to go to 4700

## 2011-04-07 ENCOUNTER — Encounter (HOSPITAL_COMMUNITY): Payer: Self-pay

## 2011-04-07 ENCOUNTER — Other Ambulatory Visit: Payer: Self-pay

## 2011-04-07 DIAGNOSIS — M5416 Radiculopathy, lumbar region: Secondary | ICD-10-CM | POA: Diagnosis present

## 2011-04-07 DIAGNOSIS — M1990 Unspecified osteoarthritis, unspecified site: Secondary | ICD-10-CM | POA: Diagnosis present

## 2011-04-07 DIAGNOSIS — E785 Hyperlipidemia, unspecified: Secondary | ICD-10-CM | POA: Diagnosis present

## 2011-04-07 DIAGNOSIS — I517 Cardiomegaly: Secondary | ICD-10-CM

## 2011-04-07 DIAGNOSIS — I2510 Atherosclerotic heart disease of native coronary artery without angina pectoris: Secondary | ICD-10-CM | POA: Diagnosis present

## 2011-04-07 DIAGNOSIS — R079 Chest pain, unspecified: Secondary | ICD-10-CM

## 2011-04-07 DIAGNOSIS — M519 Unspecified thoracic, thoracolumbar and lumbosacral intervertebral disc disorder: Secondary | ICD-10-CM | POA: Diagnosis present

## 2011-04-07 HISTORY — PX: TRANSTHORACIC ECHOCARDIOGRAM: SHX275

## 2011-04-07 LAB — CBC
Hemoglobin: 12.3 g/dL (ref 12.0–15.0)
MCHC: 31.9 g/dL (ref 30.0–36.0)
RDW: 14.6 % (ref 11.5–15.5)
WBC: 5.2 10*3/uL (ref 4.0–10.5)

## 2011-04-07 LAB — BASIC METABOLIC PANEL
BUN: 14 mg/dL (ref 6–23)
CO2: 28 mEq/L (ref 19–32)
Glucose, Bld: 93 mg/dL (ref 70–99)
Potassium: 4 mEq/L (ref 3.5–5.1)
Sodium: 140 mEq/L (ref 135–145)

## 2011-04-07 LAB — LIPID PANEL
HDL: 42 mg/dL (ref >39)
LDL Cholesterol: 149 mg/dL — ABNORMAL HIGH (ref 0–99)
Triglycerides: 154 mg/dL — ABNORMAL HIGH (ref <150)
VLDL: 31 mg/dL (ref 0–40)

## 2011-04-07 LAB — CARDIAC PANEL(CRET KIN+CKTOT+MB+TROPI): Total CK: 123 U/L (ref 7–177)

## 2011-04-07 MED ORDER — AMLODIPINE BESYLATE 10 MG PO TABS
10.0000 mg | ORAL_TABLET | Freq: Every day | ORAL | Status: DC
  Administered 2011-04-08 – 2011-04-11 (×4): 10 mg via ORAL
  Filled 2011-04-07 (×4): qty 1

## 2011-04-07 MED ORDER — ENOXAPARIN SODIUM 150 MG/ML ~~LOC~~ SOLN: 1.0000 mg/kg | Freq: Two times a day (BID) | SUBCUTANEOUS | Status: DC

## 2011-04-07 MED ORDER — LISINOPRIL 20 MG PO TABS
20.0000 mg | ORAL_TABLET | Freq: Every day | ORAL | Status: DC
  Administered 2011-04-08 – 2011-04-11 (×4): 20 mg via ORAL
  Filled 2011-04-07 (×5): qty 1

## 2011-04-07 MED ORDER — LISINOPRIL 20 MG PO TABS
20.0000 mg | ORAL_TABLET | Freq: Once | ORAL | Status: AC
  Administered 2011-04-07: 20 mg via ORAL
  Filled 2011-04-07: qty 1

## 2011-04-07 MED ORDER — PROMETHAZINE HCL 25 MG/ML IJ SOLN
12.5000 mg | Freq: Three times a day (TID) | INTRAMUSCULAR | Status: DC | PRN
  Administered 2011-04-07 – 2011-04-09 (×3): 12.5 mg via INTRAVENOUS
  Administered 2011-04-09: 25 mg via INTRAVENOUS
  Administered 2011-04-09 – 2011-04-11 (×4): 12.5 mg via INTRAVENOUS
  Filled 2011-04-07 (×7): qty 1

## 2011-04-07 MED ORDER — ENOXAPARIN SODIUM 40 MG/0.4ML ~~LOC~~ SOLN
40.0000 mg | SUBCUTANEOUS | Status: DC
  Administered 2011-04-07 – 2011-04-10 (×4): 40 mg via SUBCUTANEOUS
  Filled 2011-04-07 (×6): qty 0.4

## 2011-04-07 NOTE — Progress Notes (Signed)
UTILIZATION REVIEW COMPLETE  PT WAS FROM HOME PTA.  PT/OT EVAL ORDERED FOR DC PLAN. Willa Rough 04/07/2011 [phone] OR [phone]

## 2011-04-07 NOTE — Consult Note (Addendum)
CARDIOLOGY CONSULT NOTE   Patient ID: OMAR ORREGO MRN: [id number] DOB/AGE: Apr 08, 1945 66 y.o.  Admit date: 04/06/2011  Primary Physician   [name], MD, MD Primary Cardiologist   [facility] Reason for Consultation   Chest pain  JXB:[id number] H Pavia is a 66 y.o. female with no significant history of CAD.  She was at Dr Royden Purl office yesterday for knee pain and became diaphoretic. She had an ECG and pt began to complain of left arm and jaw tightness. Pt sent to the ER and admitted. She also has had chest pain (tightness) continuously for 2 weeks that she is unable to rate. In the ER, she received ASA, SL NTG and Morphine. The Morphine made the most difference in her pain. She has had a HA and blurry vision for 2 days. She has a history of sharp left-sided chest pain that reaches an 8/10 and is associated with SOB.  The chest pain will begin with exertion at times and also wakes her at times. Then she becomes SOB. She has not tried any meds for this. The exertional symptoms are helped by lying down and with or without exertion it will ease off and resolve by itself. The sharp chest pain began when she found out she had mitral valve prolapse. She has had multiple episodes over the years.  Past Medical History  Diagnosis Date   Reported Hx MVP - not seen on echo 2011   Obesity    Hx CP - Cath 2000, Cath 2007 - non-obstructive disease    Hyperlipidemia    Possible OSA    Pre-diabetic   . Hypertension     Past Surgical History  Procedure Date   Cardiac cath - Left coronary artery angiography showed a normal left main stem.  The proximal LAD was normal.  There was a 30-40% lesion present in the mid LAD in the area of the first diagonal.  The distal LAD was normal.  There was one large diagonal that appeared angiographically normal.  The left circumflex system was dominant.  There was a large caliber first obtuse marginal that was 4 mm or greater in diameter and normal in  appearance.  It bifurcated into twin vessels.  The AV groove circumflex further branched  into a lower obtuse marginal that was of medium caliber and had no angiographic disease.  The remainder of the left circumflex system was also free of disease.  The right coronary artery was a medium diameter vessel. It gives off a small PDA and at most is codominant.  There is no significant posterior AV segment.  There are two RV marginal branches that have no significant coronary artery disease.  Left ventricular angiography demonstrated normal systolic function with an estimated ejection fraction in the range of 65%.  2007   Colonoscopy with polyp removal, December 27th 2012   Resection of heel spur    Hysterectomy    Tonsillectomy   . Back surgery     Allergies  Allergen Reactions  . Zofran Itching   I have reviewed the patient's current medications. Scheduled Meds:   . amLODipine  5 mg Oral Daily  . aspirin  324 mg Oral Once  . aspirin EC  325 mg Oral Daily  . calcium carbonate  1 tablet Oral Daily  . cholecalciferol  1,000 Units Oral Daily  . docusate sodium  100 mg Oral BID  . ketorolac  30 mg Intravenous Once  .  morphine injection  4 mg Intravenous Once  .  morphine injection  4 mg Intravenous Once  . nitroGLYCERIN      . ondansetron  4 mg Intravenous Once  . pantoprazole  40 mg Oral Q1200  . senna  1 tablet Oral BID  . simvastatin  10 mg Oral q1800  . DISCONTD: enoxaparin  40 mg Subcutaneous Q24H  . DISCONTD: enoxaparin  1 mg/kg Subcutaneous Q12H   Continuous Infusions:   . sodium chloride 50 mL/hr at 04/07/11 0800  . nitroGLYCERIN 15 mcg/min (04/07/11 0836)  . DISCONTD: sodium chloride 125 mL/hr at 04/06/11 1439   PRN Meds:.acetaminophen, acetaminophen, albuterol, alum & mag hydroxide-simeth, HYDROcodone-acetaminophen, iohexol, morphine, promethazine, zolpidem, DISCONTD: nitroGLYCERIN, DISCONTD: ondansetron (ZOFRAN) IV, DISCONTD: ondansetron    History   Social History  .  Marital Status: Widowed    Spouse Name: N/A    Number of Children: N/A  . Years of Education: N/A   Occupational History  . Sitter   Social History Main Topics  . Smoking status: She smoked for only two years and quit >30 years ago.  . Smokeless tobacco: No  . Alcohol Use: No  . Drug Use: No  . Sexually Active: No   Social History Narrative  .  The patient lives in [place] alone. She has three children. She has one son who died at the age of 80  when he was murdered. She has a daughter in [place] and another daughter in [place].  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  She does not drink alcohol and denied use of illicit drugs.      Family history: Positive for coronary artery disease as well as cancer in her mother and father. Father had prostate cancer.  Mother had lung and breast cancer.   ROS: She does walk daily and has had stable exercise tolerance.  She says she has decreased to walking due to musculooskeletal pain, especially in her knees. No fevers or chills, no recent illnesses. No GI complaints. Full 14 point review of systems complete and found to be negative unless listed  above  Physical Exam: Blood pressure 181/90, pulse 61, temperature 98.3 F (36.8 C), temperature source Oral, resp. rate 16, height 5\' 5"  (1.651 m), weight 202 lb (91.627 kg), SpO2 97.00%.   General: Well developed, well nourished, African-American female in no acute distress Head: Eyes PERRLA, No xanthomas.   Normocephalic and atraumatic, oropharynx without edema or exudate. Dentition OK Lungs: Clear bilaterally to auscultation and percussion Heart: HRRR S1 S2, no rub/gallop, soft murmur. pulses are 2+ & equal all 4 extrem.   Neck: No carotid bruit.   No lymphadenopathy. No JVD but difficult to assess secondary to body habitus. Abdomen: Bowel sounds present, abdomen soft and non-tender without masses or hernias noted. Msk:   No cva tenderness. Some tenderness at L4-5. Normal strength and tone for age on right, left LE very weak. No joint deformities or effusions. Extremities: No clubbing or cyanosis. Trace edema.  Neuro: Alert and oriented X 3. No focal deficits noted. Psych:  Good affect, responds appropriately Skin: No rashes or lesions noted.  Labs:   Lab Results  Component Value Date   WBC 5.2 04/07/2011   HGB 12.3 04/07/2011   HCT 38.6 04/07/2011   MCV 86.9 04/07/2011   PLT 179 04/07/2011    Lab 04/07/11 0610  NA 140  K 4.0  CL 106  CO2 28  BUN 14  CREATININE 0.79  CALCIUM 9.2  PROT --  BILITOT --  ALKPHOS --  ALT --  AST --  GLUCOSE 93    Basename 04/07/11 0610 04/06/11 2155  CKTOTAL 123 142  CKMB 2.5 2.6  TROPONINI <0.30 <0.30   No components found with this basename: POCBNP:3 Lab Results  Component Value Date   CHOL 222* 04/07/2011   HDL 42 04/07/2011   LDLCALC 149* 04/07/2011   TRIG 154* 04/07/2011   Lab Results  Component Value Date   DDIMER 0.49* 04/06/2011   No results found for this basename: Lipase, amylase   TSH  Date/Time Value Range Status  07/31/2010 10:50 PM 4.674* 0.350-4.500 (uIU/mL) Final   No results found for this basename: VitaminB12, Folate, Ferritin, TIBC, Iron, reticctpct    Echo: August 2011  Study Conclusions    - Left ventricle: There is assymetric septal hypertrophy of the left ventricle. The intraventricular septum measures 1.5 cm and the posterior wall measures 1.1 cm at end-diastole. The cavity size was normal. There is no pathologic increase in the LVOT velocity. Systolic function was normal. The estimated ejection fraction was in the range of 60% to 65%. Wall motion was normal; there were no regional wall motion abnormalities. Due to the lack of tissue doppler, diastolic function cannot be completely assessed.   - Mitral valve: There is no evidence of systolic anterior motion (SAM) of the mitral valve. Structurally normal valve. Leaflet separation was  normal. Doppler: Transvalvular velocity was within the normal range. There was no evidence for stenosis. No significant regurgitation.  Peak gradient: 3mm Hg (D).   - Tricuspid valve: Mild regurgitation.   - Pulmonary arteries: PA peak pressure: 34mm Hg (S).  Radiology:   Dg Chest 2 View 04/06/2011  *RADIOLOGY REPORT*  Clinical Data: Left chest pain.  Shortness of breath.  CHEST - 2 VIEW  Comparison: 01/02/2011  Findings: Heart size remains within normal limits.  Both lungs are clear.  No evidence of pleural effusion.  No mass or lymphadenopathy identified.  No significant change compared to previous study.  Mild mid thoracic spine degenerative changes are again noted.  IMPRESSION: Stable exam.  No active disease.  Original Report Authenticated By: Danae Orleans, M.D.   Ct Head Wo Contrast 04/06/2011  *RADIOLOGY REPORT*  Clinical Data: Headache  CT HEAD WITHOUT CONTRAST  Technique:  Contiguous axial images were obtained from the base of the skull through the vertex without contrast.  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  No acute infarct.  Ventricle size is normal.  Negative for hemorrhage or mass.  Dense calcification the choroid  plexus bilaterally is unchanged. Peripheral calcification in the region of the left transverse sinus is unchanged and appears chronic.  No acute bony abnormality.   Original Report Authenticated By: Camelia Phenes, M.D. IMPRESSION: No acute abnormality and no change from the  prior study.   Ct Angio Chest W/cm &/or Wo Cm 04/06/2011  *RADIOLOGY REPORT*  Clinical Data: Dyspnea  CT ANGIOGRAPHY CHEST  Technique:  Multidetector CT imaging of the chest using the standard protocol during bolus administration of intravenous contrast. Multiplanar reconstructed images including MIPs were obtained and reviewed to evaluate the vascular anatomy.  Contrast: OMNIPAQUE IOHEXOL 300 MG/ML IV SOLN  Comparison: 11/09/2009  Findings:  No enlarged supraclavicular lymph nodes.  No axillary adenopathy.  There is no mediastinal or hilar adenopathy identified.  There is no pericardial or pleural effusion.  No abnormal filling defects within  the main pulmonary artery or their branches to suggest an acute pulmonary embolus.  Trachea appears patent and is midline.  Review of the visualized osseous structures is significant for mild multilevel spondylosis.  Imaging through the upper abdomen demonstrates multiple large cysts within the both lobes of the liver.  Similar in appearance to previous examination.  IMPRESSION:  1. No evidence for acute pulmonary embolus. 2.  No acute cardiopulmonary abnormalities. 3.  Polycystic liver.  Original Report Authenticated By: Rosealee Albee, M.D.   Dg Knee Complete 4 Views Left 04/06/2011  *RADIOLOGY REPORT*  Clinical Data: Left knee pain and numbness.  No known injury.  LEFT KNEE - COMPLETE 4+ VIEW  Comparison: None.  Findings: The patient has mild tricompartmental osteoarthritis with a small joint effusion.  No fracture or dislocation or bone destruction.  IMPRESSION: Tricompartmental mild osteoarthritis.  Small joint effusion.  Original Report Authenticated By: Gwynn Burly, M.D.    EKG:  SR, rate 60. Left axis dev, LVH, no ST elevation, some morphology changes from May 2012, of unclear significance.  ASSESSMENT AND PLAN:   The patient was seen today by Dr Ladona Ridgel, the patient evaluated and the data reviewed.  1. Chest pain:She has been evaluated for chest pain several times in the past and cathed at least twice but no critical CAD found. Her cardiac enzymes are negative so far. She has multiple cardiac risk factors. She does not have consistent chest pain with exertion.  If her last set of enzymes is negative, and her echo is unchanged, consider OP MV to assess for ischemia.  Signed: Theodore Demark 04/07/2011, 9:05 AM Co-Sign MD  Cardiology attending Patient seen and examined. Chart reviewed. 66  yo woman admitted with chest pain with both typical and atypical components for ischemia. No enzyme elevation. She has an abnormal ECG with possible septal MI which may be due to lead placement. She has multiple somatic complaints. 2D echo pending.  Exam is as noted above. A/P 1. Chest pain - agree with above. If echo reveals normal LV function, could obtain stress test for risk stratification as an outpatient. Would hold off on full dose heparin at this point.

## 2011-04-07 NOTE — Progress Notes (Signed)
  Echocardiogram 2D Echocardiogram has been performed.  Brenda Flores Brenda Flores 04/07/2011, 12:14 PM

## 2011-04-07 NOTE — Progress Notes (Deleted)
Jeoffrey Massed, MD Physician Signed Internal Medicine H&P 04/06/2011 8:24 PM  PATIENT DETAILS Name: Brenda Flores Age: 66 y.o. Sex: female Date of Birth: 07-Jun-1945 Admit Date: 04/06/2011 ZOX:WRUEA [name], MD, MD     CHIEF COMPLAINT:   Chest pain, headache, left leg pain since this morning.   HPI: Patient is a 66 year old black female, with a past medical history of hypertension who presents to the ED today with the above-noted complaints. Per patient she claims that she woke up around 5:00 this morning with left-sided chest pain. Patient claims that the pain is sharp and heavy at times. He claims to have associated diaphoresis. She denies any palpitations. She denies any nausea and vomiting. She also claims that at the same time she has this frontal headache. Patient also claims to have pain behind her left knee area that is worse for the past 2-3 days, but has been going on for at least a week or so. During my evaluation patient seemed very comfortable, however late the pain around 7/10. Cardiac enzymes and EKG were unremarkable, a CT angiogram of the chest did not show any pulmonary embolism. It was also negative for any acute cardiopulmonary abnormalities. She was subsequently referred to the hospitalist service for further evaluation and treatment.   ALLERGIES:  No Known Allergies   PAST MEDICAL HISTORY: Past Medical History   Diagnosis  Date   .  Hypertension        PAST SURGICAL HISTORY: Past Surgical History   Procedure  Date   .  Back surgery        MEDICATIONS AT HOME: Prior to Admission medications    Medication  Sig  Start Date  End Date  Taking?  Authorizing Provider   amLODipine (NORVASC) 5 MG tablet  Take 5 mg by mouth daily.      Yes  Historical Provider, MD   calcium carbonate (OS-CAL - DOSED IN MG OF ELEMENTAL CALCIUM) 1250 MG tablet  Take 1 tablet by mouth daily.      Yes  Historical Provider, MD   cholecalciferol (VITAMIN D) 1000 UNITS tablet   Take 1,000 Units by mouth daily.      Yes  Historical Provider, MD        FAMILY HISTORY: No family history on file.   SOCIAL HISTORY: reports that she has never smoked. She does not have any smokeless tobacco history on file. She reports that she does not drink alcohol or use illicit drugs.   REVIEW OF SYSTEMS:   Constitutional:    No  weight loss, night sweats,  Fevers, chills, fatigue.   HEENT:     No headaches, Difficulty swallowing,Tooth/dental problems,Sore throat,   No sneezing, itching, ear ache, nasal congestion, post nasal drip,    Cardio-vascular:   Orthopnea, PND, swelling in lower extremities, anasarca, dizziness, palpitations   GI:   No heartburn, indigestion, abdominal pain, nausea, vomiting, diarrhea, change in bowel habits, loss of appetite   Resp: No shortness of breath with exertion or at rest.  No excess mucus, no productive cough, No non-productive cough,  No coughing up of blood.No change in color of mucus.No wheezing.No chest wall deformity   Skin:   no rash or lesions.   GU:   no dysuria, change in color of urine, no urgency or frequency.  No flank pain.   Musculoskeletal: No joint pain or swelling.  No decreased range of motion.  No back pain.   Psych: No change in mood or affect. No  depression or anxiety.  No memory loss.     PHYSICAL EXAM: Blood pressure 166/89, pulse 64, temperature 97.7 F (36.5 C), temperature source Oral, resp. rate 20, height 5\' 5"  (1.651 m), weight 91.627 kg (202 lb), SpO2 95.00%.   General appearance :Awake, alert, not in any distress. Speech Clear. Not toxic Looking HEENT: Atraumatic and Normocephalic, pupils equally reactive to light and accomodation Neck: supple, no JVD. No cervical lymphadenopathy.   Chest:Good air entry bilaterally, no added sounds   CVS: S1 S2 regular, no murmurs.  Reproducible left-sided chest pain-on palpation Abdomen: Bowel sounds present, Non tender and not distended with no  gaurding, rigidity or rebound. Extremities: B/L Lower Ext shows no edema, both legs are warm to touch, with  dorsalis pedis pulses palpable. there is some mild to moderate tenderness behind the left knee area. However the left leg does not appear swollen or erythematous. Her left knee does not appear to have any effusion. The anterior aspect of the knee is negative for tenderness as well. Neurology: Awake alert, and oriented X 3, CN II-XII intact, Non focal, Deep Tendon Reflex-2+ all over, plantar's downgoing B/L, sensory exam is grossly intact.   Skin:No Rash Wounds:N/A   LABS ON ADMISSION:  Basename  04/06/11 1427   NA  141   K  4.1   CL  103   CO2  28   GLUCOSE  90   BUN  14   CREATININE  0.79   CALCIUM  9.5   MG  --   PHOS  --    No results found for this basename: AST:2,ALT:2,ALKPHOS:2,BILITOT:2,PROT:2,ALBUMIN:2 in the last 72 hours No results found for this basename: LIPASE:2,AMYLASE:2 in the last 72 hours Basename  04/06/11 1427   WBC  7.0   NEUTROABS  2.8   HGB  14.0   HCT  42.1   MCV  85.7   PLT  213    No results found for this basename: CKTOTAL:3,CKMB:3,CKMBINDEX:3,TROPONINI:3 in the last 72 hours Basename  04/06/11 1427   DDIMER  0.49*    No components found with this basename: POCBNP:3     RADIOLOGIC STUDIES ON ADMISSION: Dg Chest 2 View   04/06/2011  *RADIOLOGY REPORT*  Clinical Data: Left chest pain.  Shortness of breath.  CHEST - 2 VIEW  Comparison: 01/02/2011  Findings: Heart size remains within normal limits.  Both lungs are clear.  No evidence of pleural effusion.  No mass or lymphadenopathy identified.  No significant change compared to previous study.  Mild mid thoracic spine degenerative changes are again noted.  IMPRESSION: Stable exam.  No active disease.  Original Report Authenticated By: Danae Orleans, M.D.    Ct Head Wo Contrast   04/06/2011  *RADIOLOGY REPORT*  Clinical Data: Headache  CT HEAD WITHOUT CONTRAST  Technique:  Contiguous axial  images were obtained from the base of the skull through the vertex without contrast.  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  No acute infarct.  Ventricle size is normal.  Negative for hemorrhage or mass.  Dense calcification the choroid  plexus bilaterally is unchanged. Peripheral calcification in the region of the left transverse sinus is unchanged and appears chronic.  No acute bony abnormality.  IMPRESSION: No acute abnormality and no change from the  prior study.  Original Report Authenticated By: Camelia Phenes, M.D.    Ct Angio Chest W/cm &/or Wo Cm   04/06/2011  *RADIOLOGY REPORT*  Clinical  Data: Dyspnea  CT ANGIOGRAPHY CHEST  Technique:  Multidetector CT imaging of the chest using the standard protocol during bolus administration of intravenous contrast. Multiplanar reconstructed images including MIPs were obtained and reviewed to evaluate the vascular anatomy.  Contrast: OMNIPAQUE IOHEXOL 300 MG/ML IV SOLN  Comparison: 11/09/2009  Findings: No enlarged supraclavicular lymph nodes.  No axillary adenopathy.  There is no mediastinal or hilar adenopathy identified.  There is no pericardial or pleural effusion.  No abnormal filling defects within the main pulmonary artery or their branches to suggest an acute pulmonary embolus.  Trachea appears patent and is midline.  Review of the visualized osseous structures is significant for mild multilevel spondylosis.  Imaging through the upper abdomen demonstrates multiple large cysts within the both lobes of the liver.  Similar in appearance to previous examination.  IMPRESSION:  1. No evidence for acute pulmonary embolus. 2.  No acute cardiopulmonary abnormalities. 3.  Polycystic liver.  Original Report Authenticated By: Rosealee Albee, M.D.    Dg Knee Complete 4 Views Left   04/06/2011  *RADIOLOGY REPORT*  Clinical Data: Left knee pain and numbness.  No known injury.  LEFT KNEE - COMPLETE 4+  VIEW  Comparison: None.  Findings: The patient has mild tricompartmental osteoarthritis with a small joint effusion.  No fracture or dislocation or bone destruction.  IMPRESSION: Tricompartmental mild osteoarthritis.  Small joint effusion.  Original Report Authenticated By: Gwynn Burly, M.D.      ASSESSMENT AND PLAN: Present on Admission:  .Chest pain -Likely this is atypical, as she has some amount of tenderness in her left thoracic/chest wall area. 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 -At this point I would try a dose of Toradol to see if this would alleviate her pain. However given her high blood pressure, I will place her on a nitroglycerin infusion and admit her to a step down unit. Cardiac enzymes will continue to be cycled. A CT angiogram of the chest does not show a pulmonary embolism none show any acute cardiopulmonary abnormalities. We'll continue to monitor in the step down unit and further recommendations will follow depending on her clinical progress  .Hypertensive crisis -Continue on amlodipine, however she be placed on a nitroglycerin glycerin infusion as well.  Marland KitchenHeadache -Not sure whether this is a side effect of nitroglycerin she has received, but  She claims that she came in with a headache. A CT of the head is negative. We will try a dose of Toradol, if the headache is still persistent and perhaps only can do further imaging of her brain in terms of an MRI.   .Leg pain, left -On examination of the left leg, there is no swelling or erythema or increased warmth. Pain is mostly on the posterior aspect of the left and knee. Since the CT angiogram of test is negative, and because of the fact there is no local signs I actually doubt that this is from underlying DVT, I will however order a lower extremity venous Doppler.   Further plan will depend as patient's clinical course evolves and further  radiologic and laboratory data become available. Patient will be monitored closely.     DVT Prophylaxis: -Lovenox   Code Status: -Full code    Total time spent for admission equals 45 minutes.   Jeoffrey Massed 04/06/2011, 8:38 PM

## 2011-04-07 NOTE — Progress Notes (Signed)
[facility]   Subjective: Continues to complain of constant chest pressure which increases with activity. Chest pain is also reproducible with palpation of anterior chest wall. She also endorses left knee pain as well as pain in the posterior popliteal and anterior thigh region. The leg pain itself she characterizes as sharp and aching and increases with activity. She denies shortness of breath at rest but states she does become short of breath with activity. She endorse increase in shortness of breath with activity over 2 weeks and states her chest pain symptoms initially began this past Sunday at church while sitting in the pew.  Objective: Vital signs in last 24 hours: Temp:  [97.7 F (36.5 C)-98.6 F (37 C)] 98.6 F (37 C) (01/17 1133) Pulse Rate:  [54-78] 65  (01/17 1200) Resp:  [13-23] 18  (01/17 1200) BP: (130-182)/(60-99) 151/81 mmHg (01/17 1200) SpO2:  [91 %-100 %] 98 % (01/17 1200) Weight:  [91.627 kg (202 lb)] 91.627 kg (202 lb) (01/16 1328) Weight change:  Last BM Date: 04/06/11  Intake/Output from previous day: 01/16 0701 - 01/17 0700 In: 581.5 [I.V.:581.5] Out: 250 [Urine:250] Intake/Output this shift: Total I/O In: 53 [I.V.:53] Out: 300 [Urine:300]  General appearance: alert, cooperative, appears stated age and no distress Resp: clear to auscultation bilaterally, currently on room air maintaining saturations of 99% Chest wall: Bilateral chest wall tenderness with palpation Cardio: Sinus rhythm  with  regular rate and rhythm, S1, S2 normal, no murmur, click, rub or gallop, EKGs unchanged from yesterday and from old EKGs dating back to 2006. EKG from 2011 reviewed and has abnormalities that appear to be related to an appropriate lead placement, on IV GI: soft, non-tender; bowel sounds normal; no masses,  no organomegaly Extremities: extremities normal except for pain with palpation and active range of motion left knee question subtle swelling without erythema,  distal pulses both feet dorsalis pedis 1+ feet are warm to touch, no other abnormalities seen in the thigh region of both legs Neurologic: Grossly normal  Lab Results:  [facility] 04/07/11 0610 04/06/11 2155  WBC 5.2 6.5  HGB 12.3 13.0  HCT 38.6 38.8  PLT 179 201   BMET  Basename 04/07/11 0610 04/06/11 2155 04/06/11 1427  NA 140 -- 141  K 4.0 -- 4.1  CL 106 -- 103  CO2 28 -- 28  GLUCOSE 93 -- 90  BUN 14 -- 14  CREATININE 0.79 0.73 --  CALCIUM 9.2 -- 9.5    Studies/Results: Dg Chest 2 View  04/06/2011  *RADIOLOGY REPORT*  Clinical Data: Left chest pain.  Shortness of breath.  CHEST - 2 VIEW  Comparison: 01/02/2011  Findings: Heart size remains within normal limits.  Both lungs are clear.  No evidence of pleural effusion.  No mass or lymphadenopathy identified.  No significant change compared to previous study.  Mild mid thoracic spine degenerative changes are again noted.  IMPRESSION: Stable exam.  No active disease.  Original Report Authenticated By: Danae Orleans, M.D.   Ct Head Wo Contrast  04/06/2011  *RADIOLOGY REPORT*  Clinical Data: Headache  CT HEAD WITHOUT CONTRAST  Technique:  Contiguous axial images were obtained from the base of the skull through the vertex without contrast.  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  No acute infarct.  Ventricle size is normal.  Negative for hemorrhage or mass.  Dense calcification the choroid  plexus bilaterally is unchanged. Peripheral calcification in the region of the left  transverse sinus is unchanged and appears chronic.  No acute bony abnormality.  IMPRESSION: No acute abnormality and no change from the  prior study.  Original Report Authenticated By: Camelia Phenes, M.D.   Ct Angio Chest W/cm &/or Wo Cm  04/06/2011  *RADIOLOGY REPORT*  Clinical Data: Dyspnea  CT ANGIOGRAPHY CHEST  Technique:  Multidetector CT imaging of the chest using the standard protocol during bolus  administration of intravenous contrast. Multiplanar reconstructed images including MIPs were obtained and reviewed to evaluate the vascular anatomy.  Contrast: OMNIPAQUE IOHEXOL 300 MG/ML IV SOLN  Comparison: 11/09/2009  Findings: No enlarged supraclavicular lymph nodes.  No axillary adenopathy.  There is no mediastinal or hilar adenopathy identified.  There is no pericardial or pleural effusion.  No abnormal filling defects within the main pulmonary artery or their branches to suggest an acute pulmonary embolus.  Trachea appears patent and is midline.  Review of the visualized osseous structures is significant for mild multilevel spondylosis.  Imaging through the upper abdomen demonstrates multiple large cysts within the both lobes of the liver.  Similar in appearance to previous examination.  IMPRESSION:  1. No evidence for acute pulmonary embolus. 2.  No acute cardiopulmonary abnormalities. 3.  Polycystic liver.  Original Report Authenticated By: Rosealee Albee, M.D.   Dg Knee Complete 4 Views Left  04/06/2011  *RADIOLOGY REPORT*  Clinical Data: Left knee pain and numbness.  No known injury.  LEFT KNEE - COMPLETE 4+ VIEW  Comparison: None.  Findings: The patient has mild tricompartmental osteoarthritis with a small joint effusion.  No fracture or dislocation or bone destruction.  IMPRESSION: Tricompartmental mild osteoarthritis.  Small joint effusion.  Original Report Authenticated By: Gwynn Burly, M.D.    Medications:  I have reviewed the patient's current medications. Scheduled:   . amLODipine  5 mg Oral Daily  . aspirin  324 mg Oral Once  . aspirin EC  325 mg Oral Daily  . calcium carbonate  1 tablet Oral Daily  . cholecalciferol  1,000 Units Oral Daily  . docusate sodium  100 mg Oral BID  . ketorolac  30 mg Intravenous Once  .  morphine injection  4 mg Intravenous Once  .  morphine injection  4 mg Intravenous Once  . nitroGLYCERIN      . ondansetron  4 mg Intravenous Once  .  pantoprazole  40 mg Oral Q1200  . senna  1 tablet Oral BID  . simvastatin  10 mg Oral q1800  . DISCONTD: enoxaparin  40 mg Subcutaneous Q24H  . DISCONTD: enoxaparin  1 mg/kg Subcutaneous Q12H    Assessment/Plan:  Principal Problem:  *Chest pain/CAD in native artery 2007 cardiac catheterization revealed nonobstructive disease 30-40% in the LAD. Patient has had multiple episodes of recurrent chest pain with atypical as well as typical features over the years and has apparently undergone a total of 2 cardiac catheterizations without any critical disease seen. Appreciate cardiology evaluation. Agree with her risk factor stratification that if the echocardiogram is is normal that Myoview can be pursued as an outpatient. Agree that IV heparin is not indicated at this time therefore will change to DVT prophylaxis Lovenox. Will continue aspirin and statin.   Active Problems:  Hypertensive crisis Blood pressure still not at appropriate ranges but also appears to be having pain as a contributing factor. We'll continue amlodipine and monitor. Given cardiac risk factors may begin beta blocker this admission. Currently on IV nitroglycerin because of chest pain Will start  on Lisinopril and increase Norvasc to 10 mg. Too bradycardic for a Beta blocker.  Hopefully can titrate off Nitro. Stop IVF as well.    Headache RESOLVED seems to have been related to uncontrolled hypertension at presentation.   Dyslipidemia Total cholesterol was 222 and LDL cholesterol 147. She was not taking any dyslipidemic agents prior to admission. Statin has been ordered this admission and will likely need to be continued after discharge. When reviewing 2007 admission at that time patient did not have any insurance and suspect financial issues contributing to medication adherence. She tells me she has private insurance as well as her private physician now.   Leg pain, left/Osteoarthritis of left knee X-ray performed this admission  is to tricompartmental osteoarthritis.  Will order PT evaluation this admission  She should f/u with orthopedics as outpt as well.    Lumbar radiculopathy/Lumbar disc disease The patient describes symptoms consistent with radiculopathy. She endorses pain in the left thigh and posterior popliteal region which is aching and sharp in nature and worsened with activity. In addition she complains of leg giving way at times. She previous he had an MRI earlier in 2012 that demonstrated lumbar disc disease L3-L5. Likely would benefit from physical therapy evaluation this admission and possible orthopedic followup after discharge. Does not have any paresthesia or other symptoms consistent with nerve root compression.   Disposition  Remain in stepdown ICU until chest pain resolved and are clarified.  Addendum: Echocardiogram demonstrated mild LVH with mild focal basal hypertrophy the septum with normal systolic function and estimated ejection fraction 55-60% there were no regional wall motion abnormalities. She was also found to have grade 1 diastolic dysfunction. At this point suspect shortness of breath she is experiencing is related to her hypertension and possible evolving CHF secondary to grade 1 diastolic dysfunction. Most likely she can proceed with outpatient Myoview studies stable in the morning. Still awaiting results of arterial Doppler studies but also suspect that lower sternum these symptoms are related to her known lumbar disc disease.    LOS: 1 day   Junious Silk, ANP pager [phone]  [facility] [email or web address] Password: [email or web address]  04/07/2011, 12:21 PM  I have examined the patient and reviewed the chart. I have modified the above note and agree with it.   Calvert Cantor, MD [phone]

## 2011-04-08 ENCOUNTER — Encounter: Payer: Self-pay | Admitting: Physician Assistant

## 2011-04-08 DIAGNOSIS — I5030 Unspecified diastolic (congestive) heart failure: Secondary | ICD-10-CM | POA: Diagnosis present

## 2011-04-08 DIAGNOSIS — M79609 Pain in unspecified limb: Secondary | ICD-10-CM

## 2011-04-08 LAB — GLUCOSE, CAPILLARY

## 2011-04-08 MED ORDER — AMLODIPINE BESYLATE 10 MG PO TABS: 10.0000 mg | ORAL_TABLET | Freq: Every day | ORAL | Status: DC

## 2011-04-08 MED ORDER — IBUPROFEN 600 MG PO TABS
600.0000 mg | ORAL_TABLET | Freq: Four times a day (QID) | ORAL | Status: DC | PRN
  Administered 2011-04-08 – 2011-04-11 (×2): 600 mg via ORAL
  Filled 2011-04-08 (×2): qty 1

## 2011-04-08 MED ORDER — LISINOPRIL 20 MG PO TABS: 20.0000 mg | ORAL_TABLET | Freq: Every day | ORAL | Status: DC

## 2011-04-08 MED ORDER — HYDROCODONE-ACETAMINOPHEN 5-325 MG PO TABS: 1.0000 | ORAL_TABLET | ORAL | Status: DC | PRN

## 2011-04-08 MED ORDER — SIMVASTATIN 10 MG PO TABS: 10.0000 mg | ORAL_TABLET | Freq: Every day | ORAL | Status: DC

## 2011-04-08 MED ORDER — FUROSEMIDE 10 MG/ML IJ SOLN
20.0000 mg | Freq: Once | INTRAMUSCULAR | Status: AC
  Administered 2011-04-08: 20 mg via INTRAVENOUS
  Filled 2011-04-08: qty 2

## 2011-04-08 NOTE — Progress Notes (Addendum)
@   Subjective:  Mild chest tightness; no SOB; complains of left knee pain.   Objective:  Filed Vitals:   04/07/11 2100 04/07/11 2340 04/07/11 2359 04/08/11 0411  BP: 135/78 124/71    Pulse: 63 63    Temp:   98.2 F (36.8 C) 98.2 F (36.8 C)  TempSrc:   Oral Oral  Resp: 19 15    Height:      Weight:      SpO2: 97% 98%      Intake/Output from previous day:  Intake/Output Summary (Last 24 hours) at 04/08/11 0759 Last data filed at 04/08/11 0700  Gross per 24 hour  Intake  865.6 ml  Output   1750 ml  Net -884.4 ml    Physical Exam: Physical exam: Well-developed well-nourished in no acute distress.  Skin is warm and dry.  HEENT is normal.  Neck is supple. No thyromegaly.  Chest is clear to auscultation with normal expansion.  Cardiovascular exam is regular rate and rhythm.  Abdominal exam nontender or distended. No masses palpated. Extremities show no edema. Tender over left knee neuro grossly intact    Lab Results: Basic Metabolic Panel:  Basename 04/07/11 0610 04/06/11 2155 04/06/11 1427  NA 140 -- 141  K 4.0 -- 4.1  CL 106 -- 103  CO2 28 -- 28  GLUCOSE 93 -- 90  BUN 14 -- 14  CREATININE 0.79 0.73 --  CALCIUM 9.2 -- 9.5  MG -- -- --  PHOS -- -- --   CBC:  Basename 04/07/11 0610 04/06/11 2155 04/06/11 1427  WBC 5.2 6.5 --  NEUTROABS -- -- 2.8  HGB 12.3 13.0 --  HCT 38.6 38.8 --  MCV 86.9 85.5 --  PLT 179 201 --   Cardiac Enzymes:  Basename 04/07/11 1541 04/07/11 0610 04/06/11 2155  CKTOTAL 124 123 142  CKMB 2.4 2.5 2.6  CKMBINDEX -- -- --  TROPONINI <0.30 <0.30 <0.30     Assessment/Plan:  1) Chest pain - Patient states she has had intermittent chest pain since the 1960s; previous caths with nonobstructive disease. Enzymes and ECG negative; patient can be dced from a cardiac standpoint; outpatient myoview and fu with Dr Ladona Ridgel. 2) knee pain - further wu and evaluation per primary service 3) possible liver cyst on echo - would schedule  ultrasound of liver 4) hypertension - BP controlled 5) hyperlipidemia - management per primary care We will sign off; please call with questions  Olga Millers 04/08/2011, 7:59 AM

## 2011-04-08 NOTE — Progress Notes (Signed)
[facility]   Subjective: Still endorsing constant pressure-like chest discomfort that is worse with activity and is associated with shortness of breath. Discussed with the patient the findings on her echocardiogram secondary hypertension and possibly shortness of breath do to these problems i.e. diastolic dysfunction. Discussed with her medication adjustments today and discharge plan. Recommended that she notify her primary care physician of continued issues related to left lower extremity radiculopathy. Also discussed having physical therapy evaluation and possible need to adjust work habits given the radiculopathy symptoms.  Objective: Vital signs in last 24 hours: Temp:  [97.5 F (36.4 C)-98.6 F (37 C)] 98.2 F (36.8 C) (01/18 0411) Pulse Rate:  [59-74] 63  (01/17 2340) Resp:  [14-27] 15  (01/17 2340) BP: (122-159)/(63-90) 124/71 mmHg (01/17 2340) SpO2:  [95 %-100 %] 98 % (01/17 2340) Weight change:  Last BM Date: 04/06/11  Intake/Output from previous day: 01/17 0701 - 01/18 0700 In: 865.6 [P.O.:360; I.V.:505.6] Out: 1750 [Urine:1750] Intake/Output this shift:    General appearance: alert, cooperative, appears stated age and no distress Resp: clear to auscultation bilaterally, currently on room air maintaining saturations of 99% Chest wall: Bilateral chest wall tenderness with palpation Cardio: Sinus rhythm  with  regular rate and rhythm, S1, S2 normal, no murmur, click, rub or gallop, EKGs unchanged from yesterday and from old EKGs dating back to 2006. EKG from 2011 reviewed and has abnormalities that appear to be related to an appropriate lead placement, IV nitroglycerin has been discontinued GI: soft, non-tender; bowel sounds normal; no masses,  no organomegaly Extremities: extremities normal except for pain with palpation and active range of motion left knee question subtle swelling without erythema, distal pulses both feet dorsalis pedis 1+ feet are warm to touch, no  other abnormalities seen in the thigh region of both legs Neurologic: Grossly normal  Lab Results:  [facility] 04/07/11 0610 04/06/11 2155  WBC 5.2 6.5  HGB 12.3 13.0  HCT 38.6 38.8  PLT 179 201   BMET  Basename 04/07/11 0610 04/06/11 2155 04/06/11 1427  NA 140 -- 141  K 4.0 -- 4.1  CL 106 -- 103  CO2 28 -- 28  GLUCOSE 93 -- 90  BUN 14 -- 14  CREATININE 0.79 0.73 --  CALCIUM 9.2 -- 9.5    Studies/Results: Dg Chest 2 View  04/06/2011  *RADIOLOGY REPORT*  Clinical Data: Left chest pain.  Shortness of breath.  CHEST - 2 VIEW  Comparison: 01/02/2011  Findings: Heart size remains within normal limits.  Both lungs are clear.  No evidence of pleural effusion.  No mass or lymphadenopathy identified.  No significant change compared to previous study.  Mild mid thoracic spine degenerative changes are again noted.  IMPRESSION: Stable exam.  No active disease.  Original Report Authenticated By: Danae Orleans, M.D.   Ct Head Wo Contrast  04/06/2011  *RADIOLOGY REPORT*  Clinical Data: Headache  CT HEAD WITHOUT CONTRAST  Technique:  Contiguous axial images were obtained from the base of the skull through the vertex without contrast.  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  No acute infarct.  Ventricle size is normal.  Negative for hemorrhage or mass.  Dense calcification the choroid  plexus bilaterally is unchanged. Peripheral calcification in the region of the left transverse sinus is unchanged and appears chronic.  No acute bony abnormality.  IMPRESSION: No acute abnormality and no change from the  prior study.  Original Report Authenticated By: DAVID  C. Chestine Spore, M.D.   Ct Angio Chest W/cm &/or Wo Cm  04/06/2011  *RADIOLOGY REPORT*  Clinical Data: Dyspnea  CT ANGIOGRAPHY CHEST  Technique:  Multidetector CT imaging of the chest using the standard protocol during bolus administration of intravenous contrast. Multiplanar reconstructed images  including MIPs were obtained and reviewed to evaluate the vascular anatomy.  Contrast: OMNIPAQUE IOHEXOL 300 MG/ML IV SOLN  Comparison: 11/09/2009  Findings: No enlarged supraclavicular lymph nodes.  No axillary adenopathy.  There is no mediastinal or hilar adenopathy identified.  There is no pericardial or pleural effusion.  No abnormal filling defects within the main pulmonary artery or their branches to suggest an acute pulmonary embolus.  Trachea appears patent and is midline.  Review of the visualized osseous structures is significant for mild multilevel spondylosis.  Imaging through the upper abdomen demonstrates multiple large cysts within the both lobes of the liver.  Similar in appearance to previous examination.  IMPRESSION:  1. No evidence for acute pulmonary embolus. 2.  No acute cardiopulmonary abnormalities. 3.  Polycystic liver.  Original Report Authenticated By: Rosealee Albee, M.D.   Dg Knee Complete 4 Views Left  04/06/2011  *RADIOLOGY REPORT*  Clinical Data: Left knee pain and numbness.  No known injury.  LEFT KNEE - COMPLETE 4+ VIEW  Comparison: None.  Findings: The patient has mild tricompartmental osteoarthritis with a small joint effusion.  No fracture or dislocation or bone destruction.  IMPRESSION: Tricompartmental mild osteoarthritis.  Small joint effusion.  Original Report Authenticated By: Gwynn Burly, M.D.    Medications:  I have reviewed the patient's current medications. Scheduled:    . amLODipine  10 mg Oral Daily  . aspirin EC  325 mg Oral Daily  . calcium carbonate  1 tablet Oral Daily  . cholecalciferol  1,000 Units Oral Daily  . docusate sodium  100 mg Oral BID  . enoxaparin (LOVENOX) injection  40 mg Subcutaneous Q24H  . furosemide  20 mg Intravenous Once  . lisinopril  20 mg Oral Daily  . lisinopril  20 mg Oral Once  . pantoprazole  40 mg Oral Q1200  . senna  1 tablet Oral BID  . simvastatin  10 mg Oral q1800  . DISCONTD: amLODipine  5 mg Oral  Daily    Assessment/Plan:  Principal Problem:  *Chest pain/CAD in native artery 2007 cardiac catheterization revealed nonobstructive disease 30-40% in the LAD. Patient has had multiple episodes of recurrent chest pain with atypical as well as typical features over the years and has apparently undergone a total of 2 cardiac catheterizations without any critical disease seen. Appreciate cardiology evaluation. Echocardiogram shows no regional wall motion abnormalities therefore cardiology as scheduled outpatient Myoview study. Will continue aspirin and statin after discharge.   Active Problems:  Hypertensive crisis Blood pressure still not at appropriate ranges but also appears to be having pain as a contributing factor. We'll continue amlodipine and monitor. Given cardiac risk factors may begin beta blocker this admission. Currently on IV nitroglycerin because of chest pain Has been started on Lisinopril this admission and preadmission Norvasc has been increased to 10 mg. Too bradycardic for a Beta blocker. Plan to treat possible evolving diastolic heart failure-see below.  Grade 1 diastolic dysfunction Suspect persistent exertional shortness of breath is related to diastolic dysfunction so we'll give one-time dose of IV Lasix 20 mg daily.   Headache RESOLVED seems to have been related to uncontrolled hypertension at presentation.   Dyslipidemia Total cholesterol was 222 and  LDL cholesterol 147. She was not taking any dyslipidemic agents prior to admission. Statin has been ordered this admission and will likely need to be continued after discharge. When reviewing 2007 admission at that time patient did not have any insurance and suspect financial issues contributing to medication adherence. She tells me she has private insurance as well as her private physician now.   Leg pain, left/Osteoarthritis of left knee X-ray performed this admission is to tricompartmental osteoarthritis.   PT  evaluation complete- home health and rolling walker recommended. She should f/u with orthopedics as outpt as well.    Lumbar radiculopathy/Lumbar disc disease The patient describes symptoms consistent with radiculopathy. She endorses pain in the left thigh and posterior popliteal region which is aching and sharp in nature and worsened with activity. In addition she complains of leg giving way at times. She previous he had an MRI earlier in 2012 that demonstrated lumbar disc disease L3-L5. Likely would benefit from physical therapy evaluation this admission and possible orthopedic followup after discharge. Does not have any paresthesia or other symptoms consistent with nerve root compression. Preliminary lower extremity arterial Doppler studies show no problems.   Disposition  Transfer to telemetry unit. Anticipate discharge in the morning    LOS: 2 days   Junious Silk, ANP pager [phone]  [facility] [email or web address] Password: [email or web address]  04/08/2011, 9:37 AM  I have examined the patient and reviewed the chart. I have modified the above note and agree with it.   Calvert Cantor, MD [phone]

## 2011-04-08 NOTE — Progress Notes (Signed)
*  PRELIMINARY RESULTS*  VASCULAR LAB  ARTERIAL  ABI completed:  ABIs indicates normal arterial blood flow at rest bilaterally.    RIGHT    LEFT    PRESSURE WAVEFORM  PRESSURE WAVEFORM  BRACHIAL 161 Triphasic BRACHIAL 172 Triphasic  DP 193 Biphasic DP 148 Biphasic  PT 198 Triphasic PT 207 Triphasic                         RIGHT LEFT  ABI 1.15 1.20     Mila Homer, 04/08/2011, 9:12 AM   Mila Homer, 04/08/2011, 9:12 AM    .    Mila Homer 04/08/2011, 9:11 AM

## 2011-04-08 NOTE — Progress Notes (Signed)
Physical Therapy Evaluation Patient Details Name: Brenda Flores MRN: [id number] DOB: 1945-10-20 Today's Date: 04/08/2011  Problem List:  Patient Active Problem List  Diagnoses  . Chest pain  . Hypertensive crisis  . Headache  . Leg pain, left  . Dyslipidemia  . Lumbar radiculopathy  . CAD in native artery  . Lumbar disc disease  . Osteoarthritis of left knee    Past Medical History:  Past Medical History  Diagnosis Date  . Hypertension    Past Surgical History:  Past Surgical History  Procedure Date  . Back surgery     PT Assessment/Plan/Recommendation PT Assessment Clinical Impression Statement: pt is a 66 y/o lady adm with several problems involving pain; ie cp with tightness and leg pain of a radiating nature as well as a HA.  Cardiac causes have been ruled out, but this pt still has pain that affects her gait and stability.  Pt may benefit from a RW or cane to steady her gait and decrease pain at home until she can find out what is wrong.  Consider a neuro consult for the leg pain.  Consider HHPT home safety eval and rx if leg pain persists. PT Recommendation/Assessment: Patient will need skilled PT in the acute care venue PT Problem List: Decreased mobility;Pain;Decreased activity tolerance Barriers to Discharge: Decreased caregiver support PT Therapy Diagnosis : Acute pain;Difficulty walking PT Plan PT Frequency: Min 3X/week PT Treatment/Interventions: Gait training;Functional mobility training;Therapeutic activities;Patient/family education PT Recommendation Follow Up Recommendations: Home health PT (if pain persists and pt unable to walk well) Equipment Recommended: Rolling walker with 5" wheels (for stability and decreasing pain during gait) PT Goals  Acute Rehab PT Goals PT Goal Formulation: With patient Time For Goal Achievement: 7 days Pt will go Stand to Sit: with modified independence;with upper extremity assist PT Goal: Stand to Sit - Progress:  Goal set today Pt will Transfer Bed to Chair/Chair to Bed: with modified independence PT Transfer Goal: Bed to Chair/Chair to Bed - Progress: Goal set today Pt will Ambulate: 51 - 150 feet;with modified independence;with rolling walker;with least restrictive assistive device PT Goal: Ambulate - Progress: Goal set today Pt will Go Up / Down Stairs: 3-5 stairs;with modified independence;with rail(s) PT Goal: Up/Down Stairs - Progress: Goal set today  PT Evaluation Precautions/Restrictions    Prior Functioning  Home Living Lives With: Alone Receives Help From: Friend(s);Family Type of Home: House Home Layout: Multi-level Alternate Level Stairs-Rails: Right;Left Alternate Level Stairs-Number of Steps: 4 Home Access: Stairs to enter Entrance Stairs-Rails: Doctor, general practice of Steps: 3 Bathroom Shower/Tub: Engineer, manufacturing systems: Standard Prior Function Level of Independence: Independent with basic ADLs;Independent with homemaking with ambulation;Independent with gait;Independent with transfers Able to Take Stairs?: Yes Driving: Yes Vocation: Full time employment Vocation Requirements: sitter at night Cognition Cognition Arousal/Alertness: Awake/alert Overall Cognitive Status: Appears within functional limits for tasks assessed Sensation/Coordination Coordination Gross Motor Movements are Fluid and Coordinated: Yes Fine Motor Movements are Fluid and Coordinated: Not tested Extremity Assessment RUE Assessment RUE Assessment: Within Functional Limits LUE Assessment LUE Assessment: Within Functional Limits RLE Assessment RLE Assessment: Within Functional Limits (appears weak due to pain) LLE Assessment LLE Assessment: Within Functional Limits Mobility (including Balance) Bed Mobility Bed Mobility: Yes Supine to Sit: 7: Independent;With rails;HOB flat Sitting - Scoot to Edge of Bed: 7: Independent Sit to Supine: 7: Independent;HOB  flat Transfers Transfers: Yes Sit to Stand: 5: Supervision;4: Min assist;From bed;From chair/3-in-1 (min assist from lower surface 2/2 left leg pain) Sit  to Stand Details (indicate cue type and reason): vc's for leg placement and best technique Stand to Sit: 4: Min assist;To chair/3-in-1;To bed (min to lower surface again) Ambulation/Gait Ambulation/Gait: Yes Ambulation/Gait Assistance: 4: Min assist Ambulation/Gait Assistance Details (indicate cue type and reason): antalgic gait R LE Ambulation Distance (Feet): 15 Feet (times 4 to/from bathroom twice) Assistive device: 1 person hand held assist Gait Pattern: Step-through pattern;Antalgic;Decreased stance time - left;Decreased stride length  Posture/Postural Control Posture/Postural Control: No significant limitations Balance Balance Assessed: No Exercise    End of Session PT - End of Session Activity Tolerance: Patient tolerated treatment well;Patient limited by pain Patient left: in bed;with call bell in reach Nurse Communication: Mobility status for transfers;Mobility status for ambulation General Behavior During Session: [facility] for tasks performed Cognition: [facility] for tasks performed  Bronda Alfred, Eliseo Gum 04/08/2011, 12:20 PM  04/08/2011  [place] Bing, PT [phone] (pager)

## 2011-04-09 MED ORDER — LIDOCAINE HCL (PF) 1 % IJ SOLN
5.0000 mL | Freq: Once | INTRAMUSCULAR | Status: AC
  Administered 2011-04-09: 5 mL
  Filled 2011-04-09: qty 5

## 2011-04-09 MED ORDER — MAGNESIUM SULFATE 40 MG/ML IJ SOLN
2.0000 g | Freq: Once | INTRAMUSCULAR | Status: AC
Start: 2011-04-09 — End: 2011-04-09
  Administered 2011-04-09: 2 g via INTRAVENOUS
  Filled 2011-04-09: qty 50

## 2011-04-09 MED ORDER — PREGABALIN 50 MG PO CAPS
50.0000 mg | ORAL_CAPSULE | Freq: Two times a day (BID) | ORAL | Status: DC
  Administered 2011-04-09 – 2011-04-11 (×5): 50 mg via ORAL
  Filled 2011-04-09 (×5): qty 1

## 2011-04-09 MED ORDER — TRIAMCINOLONE ACETONIDE 40 MG/ML IJ SUSP
40.0000 mg | Freq: Once | INTRAMUSCULAR | Status: AC
  Administered 2011-04-09: 40 mg via INTRA_ARTICULAR
  Filled 2011-04-09: qty 1

## 2011-04-09 NOTE — Consult Note (Signed)
Reason for Consult: Evaluation left knee pain Referring Physician: Hennie Gosa is an 66 y.o. female.  HPI: She is a patient of our practice who has seen Dr. Turner Daniels and Dr. Renae Fickle in the past. She had previous arthroscopic surgery of the left knee and has known left knee osteoarthritis. She has been in the hospital with complaints of chest pain and has ruled out for any cardiac problems but continues to have left knee pain. The pain increased significantly about 2 weeks ago. Anterior posterior in the knee. Worst with bending. She notes crepitance and popping in the knee. Occasionally shoots down to her toes but she has no associated numbness or tingling.  Past Medical History  Diagnosis Date  . Hypertension     Past Surgical History  Procedure Date  . Back surgery     No family history on file.  Social History:  reports that she has never smoked. She does not have any smokeless tobacco history on file. She reports that she does not drink alcohol or use illicit drugs.  Allergies:  Allergies  Allergen Reactions  . Zofran Itching    Medications:  Scheduled:   . amLODipine  10 mg Oral Daily  . calcium carbonate  1 tablet Oral Daily  . cholecalciferol  1,000 Units Oral Daily  . docusate sodium  100 mg Oral BID  . enoxaparin (LOVENOX) injection  40 mg Subcutaneous Q24H  . lidocaine  5 mL Other Once  . lisinopril  20 mg Oral Daily  . magnesium sulfate 1 - 4 g bolus IVPB  2 g Intravenous Once  . pantoprazole  40 mg Oral Q1200  . pregabalin  50 mg Oral BID  . senna  1 tablet Oral BID  . simvastatin  10 mg Oral q1800  . triamcinolone acetonide  40 mg Intra-articular Once    Results for orders placed during the hospital encounter of 04/06/11 (from the past 48 hour(s))  CARDIAC PANEL(CRET KIN+CKTOT+MB+TROPI)     Status: Normal   Collection Time   04/07/11  3:41 PM      Component Value Range Comment   Total CK 124  7 - 177 (U/L)    CK, MB 2.4  0.3 - 4.0 (ng/mL)    Troponin I <0.30  <0.30 (ng/mL)    Relative Index 1.9  0.0 - 2.5    GLUCOSE, CAPILLARY     Status: Normal   Collection Time   04/08/11  7:47 AM      Component Value Range Comment   Glucose-Capillary 79  70 - 99 (mg/dL)    Comment 1 Notify RN      Comment 2 Documented in Chart       No results found.  Review of Systems  Cardiovascular: Positive for chest pain.  All other systems reviewed and are negative.   Blood pressure 142/79, pulse 72, temperature 98.2 F (36.8 C), temperature source Oral, resp. rate 16, height 5\' 5"  (1.651 m), weight 91.627 kg (202 lb), SpO2 94.00%. Physical Exam  Constitutional: She is oriented to person, place, and time. She appears well-developed and well-nourished.  HENT:  Head: Atraumatic.  Eyes: EOM are normal.  Cardiovascular: Intact distal pulses.   Respiratory: Effort normal.  Musculoskeletal:       Left knee: She exhibits decreased range of motion. She exhibits no swelling, no effusion, no ecchymosis, no deformity, no laceration and no erythema. tenderness found. Medial joint line and lateral joint line tenderness noted.  Possibly trace efffusion. Pain with ROM and a little tender behind the knee. Neg SLR.  Distally NSTLT L1-S1, intact toe/ankle DF/PF.   Neurological: She is alert and oriented to person, place, and time.  Skin: Skin is warm and dry.  Psychiatric: She has a normal mood and affect.    Assessment/Plan: Left knee flare of osteoarthritis.  We talked about diagnosis prognosis and treatment options. We talked about ice anti-inflammatories and rest as well as a corticosteroid injection. She want to go ahead with the corticosteroid injection.  I injected the knee with 1 cc Kenalog and 5 cc 1% lidocaine. This was done after sterile prep in a lateral anterior portal position. She tolerated this well. No adverse events were noted. A Band-Aid was applied.  She'll follow up with Dr. Turner Daniels in the next 4-6 weeks if she continues to have  pain and problems in his knee.  Mable Paris 04/09/2011, 2:44 PM

## 2011-04-09 NOTE — Progress Notes (Signed)
[facility]   Subjective: Still endorsing a headache. Also stating today that the pain in her left leg is severe. After I left the room, the patient decided to get up on her own and took the long way around the bed to the commode and got weak and slid down to the floor. She had just been advised by myself that she would need to go home with a rolling walker and home health PT.   Objective: Vital signs in last 24 hours: Temp:  [97.6 F (36.4 C)-98.3 F (36.8 C)] 98.3 F (36.8 C) (01/19 0600) Pulse Rate:  [69-82] 69  (01/19 0600) Resp:  [16] 16  (01/19 0600) BP: (134-162)/(72-82) 134/72 mmHg (01/19 0600) SpO2:  [95 %] 95 % (01/19 0600) Weight change:  Last BM Date: 04/08/11  Intake/Output from previous day: 01/18 0701 - 01/19 0700 In: 600 [P.O.:600] Out: 850 [Urine:850] Intake/Output this shift:    General appearance: alert, cooperative, appears stated age and no distress Resp: clear to auscultation bilaterally, currently on room air maintaining saturations of 99% Chest wall: Bilateral chest wall tenderness with palpation Cardio: Sinus rhythm  with  regular rate and rhythm, S1, S2 normal, no murmur, click, rub or gallop, EKGs unchanged from yesterday and from old EKGs dating back to 2006. EKG from 2011 reviewed and has abnormalities that appear to be related to an appropriate lead placement, IV nitroglycerin has been discontinued GI: soft, non-tender; bowel sounds normal; no masses,  no organomegaly Extremities: extremities normal except for pain with palpation and active range of motion left knee tender, distal pulses both feet dorsalis pedis 1+ feet are warm to touch, no other abnormalities seen in the thigh region of both legs Neurologic: Grossly normal  Lab Results:  Basename 04/07/11 0610 04/06/11 2155  WBC 5.2 6.5  HGB 12.3 13.0  HCT 38.6 38.8  PLT 179 201   BMET  Basename 04/07/11 0610 04/06/11 2155 04/06/11 1427  NA 140 -- 141  K 4.0 -- 4.1  CL 106 -- 103   CO2 28 -- 28  GLUCOSE 93 -- 90  BUN 14 -- 14  CREATININE 0.79 0.73 --  CALCIUM 9.2 -- 9.5    Studies/Results: No results found.  Medications:  I have reviewed the patient's current medications. Scheduled:    . amLODipine  10 mg Oral Daily  . calcium carbonate  1 tablet Oral Daily  . cholecalciferol  1,000 Units Oral Daily  . docusate sodium  100 mg Oral BID  . enoxaparin (LOVENOX) injection  40 mg Subcutaneous Q24H  . lisinopril  20 mg Oral Daily  . magnesium sulfate 1 - 4 g bolus IVPB  2 g Intravenous Once  . pantoprazole  40 mg Oral Q1200  . pregabalin  50 mg Oral BID  . senna  1 tablet Oral BID  . simvastatin  10 mg Oral q1800    Assessment/Plan:  Principal Problem:  *Chest pain/CAD in native artery 2007 cardiac catheterization revealed nonobstructive disease 30-40% in the LAD. Patient has had multiple episodes of recurrent chest pain with atypical as well as typical features over the years and has apparently undergone a total of 2 cardiac catheterizations without any critical disease seen. Appreciate cardiology evaluation. Echocardiogram shows no regional wall motion abnormalities therefore cardiology as scheduled outpatient Myoview study. Will continue aspirin and statin after discharge.   Active Problems:  Hypertensive crisis Blood pressure still not at appropriate ranges but also appears to be having pain as a contributing factor. We'll continue  amlodipine and monitor. Given cardiac risk factors may begin beta blocker this admission. Currently on IV nitroglycerin because of chest pain Has been started on Lisinopril this admission and preadmission Norvasc has been increased to 10 mg. Too bradycardic for a Beta blocker.  Grade 1 diastolic dysfunction   Headache RESOLVED seems to have been related to uncontrolled hypertension at presentation.   Dyslipidemia Total cholesterol was 222 and LDL cholesterol 147. She was not taking any dyslipidemic agents prior to  admission. Statin has been ordered this admission and will likely need to be continued after discharge. When reviewing 2007 admission at that time patient did not have any insurance and suspect financial issues contributing to medication adherence. She tells me she has private insurance as well as her private physician now.   Leg pain, left/Osteoarthritis of left knee X-ray performed this admission is to tricompartmental osteoarthritis.   PT evaluation complete- home health and rolling walker recommended. I have contacted Ortho to evaluate her.    Lumbar radiculopathy/Lumbar disc disease The patient describes symptoms consistent with radiculopathy. She endorses pain in the left thigh and posterior popliteal region which is aching and sharp in nature and worsened with activity. In addition she complains of leg giving way at times. She previous he had an MRI earlier in 2012 that demonstrated lumbar disc disease L3-L5. Likely would benefit from physical therapy evaluation this admission and possible orthopedic followup after discharge. Does not have any paresthesia or other symptoms consistent with nerve root compression. Preliminary lower extremity arterial Doppler studies show no problems. I have started BID Lyrica.    Disposition Anticipate discharge in the morning  Calvert Cantor, MD [phone]   LOS: 3 days  04/09/2011, 1:24 PM

## 2011-04-09 NOTE — Progress Notes (Signed)
Pt stated she was getting up to [facility] and her left knee "gave out" on her, she stated she lowered herself to the ground, and was not injured, fall was not witnessed, no visible injury noted, pt did not hit head neuro check WNL, MD notified, no new orders neccessary, will continue to monitor.

## 2011-04-10 MED ORDER — PREGABALIN 50 MG PO CAPS: 50.0000 mg | ORAL_CAPSULE | Freq: Two times a day (BID) | ORAL | Status: DC

## 2011-04-10 MED ORDER — HYDROCODONE-ACETAMINOPHEN 5-325 MG PO TABS: 1.0000 | ORAL_TABLET | ORAL | Status: AC | PRN

## 2011-04-10 MED ORDER — SIMVASTATIN 10 MG PO TABS: 10.0000 mg | ORAL_TABLET | Freq: Every day | ORAL | Status: DC

## 2011-04-10 MED ORDER — LISINOPRIL 20 MG PO TABS: 20.0000 mg | ORAL_TABLET | Freq: Every day | ORAL | Status: DC

## 2011-04-10 NOTE — Progress Notes (Signed)
   CARE MANAGEMENT NOTE 04/10/2011  Patient:  Brenda Flores, Brenda Flores   Account Number:  [id number]  Date Initiated:  04/07/2011  Documentation initiated by:  Onnie Boer  Subjective/Objective Assessment:   PT WAS ADMITTED WITH CP/ HTN     Action/Plan:   PROGRESSION OF CARE AND DISCHARGE PLANNING   Anticipated DC Date:  04/11/2011   Anticipated DC Plan:  HOME/SELF CARE      DC Planning Services  CM consult      [facility] Choice  HOME HEALTH   Choice offered to / List presented to:  C-1 Patient   DME arranged  3-N-1  WALKER - ROLLING  TUB BENCH      DME agency  [facility]     HH arranged  HH-2 PT      HH agency  [facility]   Status of service:  Completed, signed off Medicare Important Message given?   (If response is "NO", the following Medicare IM given date fields will be blank) Date Medicare IM given:   Date Additional Medicare IM given:    Discharge Disposition:  HOME W HOME HEALTH SERVICES  Per UR Regulation:  Reviewed for med. necessity/level of care/duration of stay  Comments:  04/10/2011 1210 Contacted pt and she requested [facility] for [facility]. Contacted [facility] for DME and [facility] PT for scheduled d/c on 04/11/11. Isidoro Donning RN CCM Case Mgmt phone [phone]  UR COMPLETED 04/07/11 Onnie Boer, RN, BSN [id number] PT WAS ADMITTED WITH CP/ HTN ON NITRO GTT, WILL ORDER PT/OT FOR DC PLAN

## 2011-04-10 NOTE — Progress Notes (Signed)
[facility]   Subjective: Still endorsing a headache. Left knee feels better after being injected with steroid yesterday. Shooting left leg pain has resolved as well. She wants to get stronger today before leaving tomorrow. Someone from her family will be able to stay with her tomorrow.   Objective: Vital signs in last 24 hours: Temp:  [98.2 F (36.8 C)-98.6 F (37 C)] 98.6 F (37 C) (01/20 0500) Pulse Rate:  [18-72] 67  (01/20 0500) Resp:  [16-18] 18  (01/20 0500) BP: (135-142)/(75-79) 138/75 mmHg (01/20 0500) SpO2:  [94 %-95 %] 95 % (01/20 0500) Weight change:  Last BM Date: 04/08/11   General appearance: alert, cooperative, appears stated age and no distress Resp: clear to auscultation bilaterally, currently on room air maintaining saturations of 99% Chest wall: Bilateral chest wall tenderness with palpation Cardio: Sinus rhythm  with  regular rate and rhythm, S1, S2 normal, no murmur, click, rub or gallop GI: soft, non-tender; bowel sounds normal; no masses,  no organomegaly Extremities: extremities normal except for pain with palpation and active range of motion left knee tender, distal pulses both feet dorsalis pedis 1+ feet are warm to touch, no other abnormalities seen in the thigh region of both legs Neurologic: Grossly normal  Lab Results: No results found for this basename: WBC:2,HGB:2,HCT:2,PLT:2 in the last 72 hours BMET No results found for this basename: NA:2,K:2,CL:2,CO2:2,GLUCOSE:2,BUN:2,CREATININE:2,CALCIUM:2 in the last 72 hours  Studies/Results: No results found.  Medications:  I have reviewed the patient's current medications. Scheduled:    . amLODipine  10 mg Oral Daily  . calcium carbonate  1 tablet Oral Daily  . cholecalciferol  1,000 Units Oral Daily  . docusate sodium  100 mg Oral BID  . enoxaparin (LOVENOX) injection  40 mg Subcutaneous Q24H  . lidocaine  5 mL Other Once  . lisinopril  20 mg Oral Daily  . pantoprazole  40 mg Oral  Q1200  . pregabalin  50 mg Oral BID  . senna  1 tablet Oral BID  . simvastatin  10 mg Oral q1800  . triamcinolone acetonide  40 mg Intra-articular Once    Assessment/Plan:  Principal Problem:  *Chest pain/CAD in native artery 2007 cardiac catheterization revealed nonobstructive disease 30-40% in the LAD. Patient has had multiple episodes of recurrent chest pain with atypical as well as typical features over the years and has apparently undergone a total of 2 cardiac catheterizations without any critical disease seen. Appreciate cardiology evaluation. Echocardiogram shows no regional wall motion abnormalities therefore cardiology as scheduled outpatient Myoview study. Will continue aspirin and statin after discharge.   Active Problems:  Hypertensive crisis  Has been started on Lisinopril this admission and preadmission Norvasc has been increased to 10 mg. She is too bradycardic for a Beta blocker.  Grade 1 diastolic dysfunction   Headache Related to anxiety? Stress? She states that it went away after the Magnesium was given yesterday but returned today. Will try Mg+ again. Cannot give Triptans due to HTN.    Dyslipidemia Total cholesterol was 222 and LDL cholesterol 147. She was not taking any dyslipidemic agents prior to admission. Statin has been ordered this admission and will likely need to be continued after discharge. When reviewing 2007 admission at that time patient did not have any insurance and suspect financial issues contributing to medication adherence. She tells me she has private insurance as well as her private physician now.   Leg pain, left/Osteoarthritis of left knee X-ray performed this admission is to tricompartmental osteoarthritis.  PT evaluation complete- home health and rolling walker recommended. Intraarticular steroid injection administered by dr Ave Filter yesterday.  She is to f/u with their office if more problems ensue.    Lumbar radiculopathy/Lumbar disc  disease The patient describes symptoms consistent with radiculopathy. She endorses pain in the left thigh and posterior popliteal region which is aching and sharp in nature and worsened with activity. In addition she complains of leg giving way at times. She previous he had an MRI earlier in 2012 that demonstrated lumbar disc disease L3-L5. I have started BID Lyrica- she has noted improvement.    Disposition Anticipate discharge in the morning   Home health set up by case management Calvert Cantor, MD [phone]   LOS: 4 days  04/10/2011, 1:50 PM

## 2011-04-11 ENCOUNTER — Other Ambulatory Visit (HOSPITAL_COMMUNITY): Payer: Self-pay | Admitting: Internal Medicine

## 2011-04-11 DIAGNOSIS — R079 Chest pain, unspecified: Secondary | ICD-10-CM

## 2011-04-11 NOTE — Progress Notes (Signed)
D/c orders received, IV removed with gauze on; pt remains in stable condition; pt meds and instructions reviewed and given to pt; pt d/c to home

## 2011-04-11 NOTE — Progress Notes (Signed)
Around 1100am pt up to [facility]; pt stated she felt wheezy and that she had tingling going down to her L hand; lungs clear to ausculation; pt stated that this has been going on for a long time; MD paged and made aware, no new orders received; currently pt without complaints and tingling has gone away

## 2011-04-11 NOTE — Progress Notes (Addendum)
Physical Therapy Treatment Patient Details Name: Brenda Flores MRN: [id number] DOB: [birth year] Today's Date: 04/11/2011  PT Assessment/Plan  PT - Assessment/Plan Comments on Treatment Session: 66 year old black female, with a past medical history of hypertension who presents to the ED today with chest pain, headache, left leg pain since this morning. Pt reports knee pain is decreased since steriod shot but still hurts behind her knee when she attempts to go from sit to stand from lower surfaces.  Pt able to ambulate and walk up stairs today with supervision and little fatigue.   PT Plan: Discharge plan remains appropriate PT Frequency: Min 3X/week Follow Up Recommendations: Home health PT - patient to check on her specific benefits Equipment Recommended: Rolling walker with 5" wheels PT Goals  Acute Rehab PT Goals PT Goal: Stand to Sit - Progress: Met PT Goal: Ambulate - Progress: Progressing toward goal PT Goal: Up/Down Stairs - Progress: Progressing toward goal  PT Treatment Precautions/Restrictions  Restrictions Weight Bearing Restrictions: No Mobility (including Balance) Bed Mobility Bed Mobility: Yes Supine to Sit: 7: Independent Sitting - Scoot to Edge of Bed: 7: Independent Sit to Supine: 7: Independent Transfers Transfers: Yes Sit to Stand: 7: Independent;From bed;With upper extremity assist Stand to Sit: 7: Independent;To bed;With upper extremity assist Ambulation/Gait Ambulation/Gait: Yes Ambulation/Gait Assistance: 5: Supervision Ambulation Distance (Feet): 150 Feet Assistive device: Rolling walker Gait Pattern: Within Functional Limits Stairs: Yes Stairs Assistance: 5: Supervision Stairs Assistance Details (indicate cue type and reason): cueing for how to take her RW folded up the stairs with one hand and use the other hand on the railing  Stair Management Technique: One rail Right;With walker Number of Stairs: 5  Height of Stairs: 6  Wheelchair  Mobility Wheelchair Mobility: No  Posture/Postural Control Posture/Postural Control: No significant limitations Balance Balance Assessed: Yes Static Standing Balance Static Standing - Balance Support: No upper extremity supported Static Standing - Level of Assistance: 7: Independent End of Session PT - End of Session Equipment Utilized During Treatment: Gait belt Activity Tolerance: Patient tolerated treatment well Patient left: in bed;with call bell in reach;with bed alarm set General Behavior During Session: [facility] for tasks performed Cognition: [facility] for tasks performed  Elvera Bicker 04/11/2011, 2:05 PM  [facility] [phone] (pager)

## 2011-04-11 NOTE — Discharge Summary (Signed)
DISCHARGE SUMMARY  Brenda Flores  MR#: [id number]  DOB:10-25-1945  Date of Admission: 04/06/2011 Date of Discharge: 04/11/2011  Attending Physician:Brenda Butler Denmark, MD  Patient's Brenda [name], MD, MD  Consults:Treatment Team:  Brenda Paris, MD-orthopedics Brenda Flores, M.D.-cardiology  Discharge Diagnoses: Principal Problem:  *Chest pain Active Problems:  Hypertensive crisis  CAD in native artery  Headache  Dyslipidemia  Leg pain, left  Lumbar radiculopathy  Lumbar disc disease  Osteoarthritis of left knee  Diastolic heart failure, NYHA class 1   Radiology: Dg Chest 2 View  04/06/2011  *RADIOLOGY REPORT*  Clinical Data: Left chest pain.  Shortness of breath.  CHEST - 2 VIEW  Comparison: 01/02/2011  Findings: Heart size remains within normal limits.  Both lungs are clear.  No evidence of pleural effusion.  No mass or lymphadenopathy identified.  No significant change compared to previous study.  Mild mid thoracic spine degenerative changes are again noted.  IMPRESSION: Stable exam.  No active disease.  Original Report Authenticated By: Brenda Flores, M.D.   Ct Head Wo Contrast  04/06/2011  *RADIOLOGY REPORT*  Clinical Data: Headache  CT HEAD WITHOUT CONTRAST  Technique:  Contiguous axial images were obtained from the base of the skull through the vertex without contrast.  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  No acute infarct.  Ventricle size is normal.  Negative for hemorrhage or mass.  Dense calcification the choroid  plexus bilaterally is unchanged. Peripheral calcification in the region of the left transverse sinus is unchanged and appears chronic.  No acute bony abnormality.  IMPRESSION: No acute abnormality and no change from the  prior study.  Original Report Authenticated By: Camelia Flores, M.D.   Ct Angio Chest W/cm &/or Wo Cm  04/06/2011  *RADIOLOGY REPORT*  Clinical Data: Dyspnea  CT  ANGIOGRAPHY CHEST  Technique:  Multidetector CT imaging of the chest using the standard protocol during bolus administration of intravenous contrast. Multiplanar reconstructed images including MIPs were obtained and reviewed to evaluate the vascular anatomy.  Contrast: OMNIPAQUE IOHEXOL 300 MG/ML IV SOLN  Comparison: 11/09/2009  Findings: No enlarged supraclavicular lymph nodes.  No axillary adenopathy.  There is no mediastinal or hilar adenopathy identified.  There is no pericardial or pleural effusion.  No abnormal filling defects within the main pulmonary artery or their branches to suggest an acute pulmonary embolus.  Trachea appears patent and is midline.  Review of the visualized osseous structures is significant for mild multilevel spondylosis.  Imaging through the upper abdomen demonstrates multiple large cysts within the both lobes of the liver.  Similar in appearance to previous examination.  IMPRESSION:  1. No evidence for acute pulmonary embolus. 2.  No acute cardiopulmonary abnormalities. 3.  Polycystic liver.  Original Report Authenticated By: Brenda Flores, M.D.   Dg Knee Complete 4 Views Left  04/06/2011  *RADIOLOGY REPORT*  Clinical Data: Left knee pain and numbness.  No known injury.  LEFT KNEE - COMPLETE 4+ VIEW  Comparison: None.  Findings: The patient has mild tricompartmental osteoarthritis with a small joint effusion.  No fracture or dislocation or bone destruction.  IMPRESSION: Tricompartmental mild osteoarthritis.  Small joint effusion.  Original Report Authenticated By: Brenda Flores, M.D.    Laboratory: No results found for this or any previous visit (from the past 48 hour(s)).   Current Discharge Medication List    START taking these medications   Details  HYDROcodone-acetaminophen (NORCO) 5-325 MG  per tablet Take 1 tablet by mouth every 4 (four) hours as needed for pain. Qty: 30 tablet, Refills: 0    lisinopril (PRINIVIL,ZESTRIL) 20 MG tablet Take 1 tablet  (20 mg total) by mouth daily. Qty: 30 tablet, Refills: 0    pregabalin (LYRICA) 50 MG capsule Take 1 capsule (50 mg total) by mouth 2 (two) times daily. Qty: 60 capsule, Refills: 0    simvastatin (ZOCOR) 10 MG tablet Take 1 tablet (10 mg total) by mouth daily at 6 PM. Qty: 30 tablet, Refills: 0      CONTINUE these medications which have CHANGED   Details  amLODipine (NORVASC) 10 MG tablet Take 1 tablet (10 mg total) by mouth daily. Qty: 30 tablet, Refills: 0      CONTINUE these medications which have NOT CHANGED   Details  calcium carbonate (OS-CAL - DOSED IN MG OF ELEMENTAL CALCIUM) 1250 MG tablet Take 1 tablet by mouth daily.    cholecalciferol (VITAMIN D) 1000 UNITS tablet Take 1,000 Units by mouth daily.       History of present illness: Brenda Flores to female known history of hypertension presented with chest pain, headache, left leg pain onset 1 and admission. She endorse waking up at 5 in the morning with left-sided chest pain that was sharp and heavy at times and reported this was associated with diaphoresis. She was not experiencing any palpitations per her report. This was also not associated with nausea or vomiting. She claims at the same time she had a frontal headache. And also complains of left knee pain worse behind the knee but this pain has been going on for at least one week but worse over the past 2-3 days. The emergency department initial cardiac isoenzymes as well as EKG were unremarkable for any acute ischemia. A CT angiogram of the chest was completed in the ER as well and did not show any evidence of pulmonary embolism. In addition the CT showed no other acute cardiopulmonary abnormalities. In the emergency department she was hypertensive with a peak blood pressure 181/91. On evaluation by the admitting physician her blood pressure was 166/89 with a pulse of 64 respirations were 16 and her temperature was 97.7 Electrolyte panel as well as CBC were all within normal limits. Her  d-dimer was mildly elevated at 0.49 and this is what prompted a CT angiogram of the chest by the emergency room physician. She was admitted with chest pain of atypical quality. 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 In addition her physical exam demonstrated tenderness in the left thoracic/chest wall area.    Hospital Course: Principal Problem:  *Chest pain /CAD in native artery Cardiac isoenzymes were cycled x3 and were negative this admission. Repeat EKG on the morning after admission was completed because of continued chest pain and this also remained without any evidence of ischemia. Old records revealed that the patient had a undergone 2 cardiac catheterizations over the years with the most recent being in 2007. This revealed nonobstructive disease of 30-40% LAD. Because of her history cardio to consult was obtained. A 2-D echocardiogram was completed that showed evidence of diastolic dysfunction only, no regional wall motion abnormalities. At this point cardiology feels it is appropriate to proceed with an outpatient Myoview study  which has been scheduled. She was started on aspirin therapy as well as a statin drug this admission which will continue after discharge. Because  of issues with bradycardia a beta blocker was not initiated this admission. Please note that on the morning of discharge after I initially evaluated the patient she reported to the nurse after ambulating that she was short of breath and felt wheezy but on clinical exam her lungs were clear and she was moving air well. In addition she reported some tingling down her left arm which after further questioning was and has been a chronic problem for several years.  Active Problems:  Hypertensive crisis Blood pressure was not well controlled at presentation. She was on 5 mg of Norvasc prior to admission and this has been increased to 10 mg  this admission. She's also been started on lisinopril. On date of discharge her blood pressure is 163/91 but this was in association with the left arm pain and tingling. At this point additional adjustments or adjustments of blood pressure medications can be performed in the outpatient setting.   Headache This is been a recurrent problem this hospitalization and seems directly correlated with uncontrolled blood pressure. Recommend use of over-the-counter analgesics and continued appropriate control of blood pressure.   Dyslipidemia During this admission patient underwent a fasting lipid panel which revealed a total cholesterol of 222 and an LDL cholesterol of 147. She was not taking any dyslipidemic agents prior to admission. Zocor is been started this admission and will continue this after discharge. Recommend followup with her primary care physician to check a lipid panel as well as LFTs within several weeks after discharge.   Leg pain, left/Osteoarthritis of left knee X-ray left knee was completed that demonstrated tricompartmental osteoarthritis. Physical therapy evaluated the patient this admission and recommended home health PT and rolling walker. Orthopedics also evaluated the patient and on 04/10/2011 she underwent an injection of the left knee with steroids by Dr. Ave Filter with orthopedics after full consultation. Recommendation is to followup with Dr. Turner Daniels in 4-6 weeks. Please note that lower extremity arterial Doppler studies were completed this admission and showed normal arterial flow.   Lumbar radiculopathy/Lumbar disc disease Patient has been describing symptoms this admission that are consistent with radiculopathy including sharp burning pains down the left leg mainly over the thigh and posterior popliteal region as well sensation of the leg giving away. This has been further complicated by the pain from the osteoarthritis. In review of previous diagnostic evaluation she underwent an MRI  of the lumbar sacral spine early in 2012 that demonstrated lumbar disc disease at levels L3-L5. As previously noted she will benefit from physical therapy after discharge and rolling walker. Recommend that she also informed by the orthopedic physician and the symptoms at followup for the knee problems. She may need to be referred to a neurosurgeon at some point. At the present time she does not have any paresthesia or any other symptoms consistent with nerve root compression. She was started on twice daily Lyrica on 04/09/2011.   Diastolic heart failure, NYHA class 1 Patient's echocardiogram this admission demonstrated grade 1 diastolic dysfunction likely related to uncontrolled hypertension. She's had vague chest pressure symptoms associated with dyspnea on exertion and therefore she was given a one-time dose of Lasix. At the present time chronic diuretic therapy is not indicated. Focus will be on afterload reduction and blood pressure control.    Day of Discharge BP 163/91  Pulse 67  Temp(Src) 97.6 F (36.4 C) (Oral)  Resp 18  Ht 5\' 5"  (1.651 m)  Wt 91.627 kg (202 lb)  BMI 33.61 kg/m2  SpO2 96%  Physical Exam:  General appearance: alert, cooperative, appears stated age and no distress Resp: clear to auscultation bilaterally Cardio: regular rate and rhythm, S1, S2 normal, no murmur, click, rub or gallop GI: soft, non-tender; bowel sounds normal; no masses,  no organomegaly Extremities: extremities normal, atraumatic, no cyanosis or edema Neurologic: Grossly normal  Follow-up: She is to followup with her primary care physician Dr. [name], M.D. in one week for routine hospital followup. In addition we recommend outpatient evaluation for lumbar disc disease with associated leg pain and weakness.  She is to followup with the [facility] on January 24 at 12:30 PM this is for evaluation for upcoming stress Myoview study. She has been informed to let them know she is having  mobility problems related to knee pain since this will affect the type of Myoview study that will be ordered.  She also is to call Dr. Homero Fellers Rowan's office to be seen in 4-6 weeks for routine followup regarding the left knee injection.  I have counseled her regarding the need to limit her work activity at least for one week because of the knee injection and the back pain. 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  Disposition:  Patient will discharge to home via private vehicle with her family.   Junious Silk, ANP pager [phone]  Pt was seen and examined at bedside. I have reviewed labs and vitals which are stable and pt is hemodynamically stable. I agree with the above's assessment and plan.   Verta Ellen, [place] [phone]

## 2011-04-12 ENCOUNTER — Ambulatory Visit (HOSPITAL_COMMUNITY): Payer: BC Managed Care – PPO | Attending: Cardiology | Admitting: Radiology

## 2011-04-12 VITALS — BP 174/95 | Ht 65.0 in | Wt 199.0 lb

## 2011-04-12 DIAGNOSIS — R1110 Vomiting, unspecified: Secondary | ICD-10-CM | POA: Insufficient documentation

## 2011-04-12 DIAGNOSIS — E785 Hyperlipidemia, unspecified: Secondary | ICD-10-CM | POA: Insufficient documentation

## 2011-04-12 DIAGNOSIS — R0609 Other forms of dyspnea: Secondary | ICD-10-CM | POA: Insufficient documentation

## 2011-04-12 DIAGNOSIS — R5381 Other malaise: Secondary | ICD-10-CM | POA: Insufficient documentation

## 2011-04-12 DIAGNOSIS — M79609 Pain in unspecified limb: Secondary | ICD-10-CM | POA: Insufficient documentation

## 2011-04-12 DIAGNOSIS — I2510 Atherosclerotic heart disease of native coronary artery without angina pectoris: Secondary | ICD-10-CM

## 2011-04-12 DIAGNOSIS — R0602 Shortness of breath: Secondary | ICD-10-CM | POA: Insufficient documentation

## 2011-04-12 DIAGNOSIS — I10 Essential (primary) hypertension: Secondary | ICD-10-CM | POA: Insufficient documentation

## 2011-04-12 DIAGNOSIS — E669 Obesity, unspecified: Secondary | ICD-10-CM | POA: Insufficient documentation

## 2011-04-12 DIAGNOSIS — R61 Generalized hyperhidrosis: Secondary | ICD-10-CM | POA: Insufficient documentation

## 2011-04-12 DIAGNOSIS — R0989 Other specified symptoms and signs involving the circulatory and respiratory systems: Secondary | ICD-10-CM | POA: Insufficient documentation

## 2011-04-12 DIAGNOSIS — R5383 Other fatigue: Secondary | ICD-10-CM | POA: Insufficient documentation

## 2011-04-12 DIAGNOSIS — R110 Nausea: Secondary | ICD-10-CM | POA: Insufficient documentation

## 2011-04-12 DIAGNOSIS — R000 Tachycardia, unspecified: Secondary | ICD-10-CM | POA: Insufficient documentation

## 2011-04-12 DIAGNOSIS — R079 Chest pain, unspecified: Secondary | ICD-10-CM | POA: Insufficient documentation

## 2011-04-12 DIAGNOSIS — R002 Palpitations: Secondary | ICD-10-CM | POA: Insufficient documentation

## 2011-04-12 MED ORDER — REGADENOSON 0.4 MG/5ML IV SOLN
0.4000 mg | Freq: Once | INTRAVENOUS | Status: AC
  Administered 2011-04-12: 0.4 mg via INTRAVENOUS

## 2011-04-12 MED ORDER — TECHNETIUM TC 99M TETROFOSMIN IV KIT
11.0000 | PACK | Freq: Once | INTRAVENOUS | Status: AC | PRN
  Administered 2011-04-12: 11 via INTRAVENOUS

## 2011-04-12 MED ORDER — TECHNETIUM TC 99M TETROFOSMIN IV KIT
33.0000 | PACK | Freq: Once | INTRAVENOUS | Status: AC | PRN
  Administered 2011-04-12: 33 via INTRAVENOUS

## 2011-04-12 NOTE — Progress Notes (Signed)
[facility] SITE 3 NUCLEAR MED [address] [place] [address] [phone]  Cardiology Nuclear Med Study  Brenda Flores is a 66 y.o. female [id number] 1946-03-06   Nuclear Med Background Indication for Stress Test:  Evaluation for Ischemia and [facility] 04/06/11 with Chest Pain/SOB, negative enzymes History:  ~30 yrs ago QMV:HQIONG per patient; '07 Cath:N/O CAD, EF=65%; 04/07/11 Echo:EF=55-60%, mild LVH Cardiac Risk Factors: Hypertension, Lipids and Obesity  Symptoms:  Chest Pain/Pressure with and without Exertion (last episode of chest discomfort was this morning around 7am and now 11:45 am, 4/10, with (L) arm pain), Diaphoresis, DOE, Fatigue, Nausea, Palpitations, Rapid HR and Vomiting   Nuclear Pre-Procedure Caffeine/Decaff Intake:  None NPO After: 9:30pm   Lungs:  Clear.  O2 SAT 96% on RA IV 0.9% NS with Angio Cath:  22g  IV Site: R Forearm  IV Started by:  Doyne Keel, CNMT  Chest Size (in):  38 Cup Size: C  Height: 5\' 5"  (1.651 m)  Weight:  199 lb (90.266 kg)  BMI:  Body mass index is 33.12 kg/(m^2). Tech Comments:  n/a    Nuclear Med Study 1 or 2 day study: 1 day  Stress Test Type:  Lexiscan  Reading MD: Willa Rough, MD  Order Authorizing Provider: Lewayne Bunting, MD; Tereso Newcomer, PA-C  Resting Radionuclide: Technetium 108m Tetrofosmin  Resting Radionuclide Dose: 11 mCi   Stress Radionuclide:  Technetium 44m Tetrofosmin  Stress Radionuclide Dose: 33 mCi           Stress Protocol Rest HR: 69 Stress HR: 110  Rest BP: 174/95 Stress BP: 173/88  Exercise Time (min): n/a METS: n/a   Predicted Max HR: 156 bpm % Max HR: 70.51 bpm Rate Pressure Product: 29528   Dose of Adenosine (mg):  n/a Dose of Lexiscan: 0.4 mg  Dose of Atropine (mg): n/a Dose of Dobutamine: n/a mcg/kg/min (at max HR)  Stress Test Technologist: Smiley Houseman, CMA-N  Nuclear Technologist:  Domenic Polite, CNMT     Rest Procedure:  Myocardial perfusion imaging  was performed at rest 45 minutes following the intravenous administration of Technetium 43m Tetrofosmin.  Rest ECG: LVH with nonspecific ST changes.  Stress Procedure:  The patient was initially scheduled to walk the treadmill utilizing the Bruce protocol, but due to a recent steroid injection to her left knee she was given Lexiscan in place of.   The patient received IV Lexiscan 0.4 mg over 15-seconds.  Technetium 75m Tetrofosmin injected at 30-seconds.  There were no significant changes with Lexiscan.  Quantitative spect images were obtained after a 45 minute delay.  Stress ECG: No significant change from baseline ECG  QPS Raw Data Images:  Patient motion noted; appropriate software correction applied. Stress Images:  Normal homogeneous uptake in all areas of the myocardium. Rest Images:  Normal homogeneous uptake in all areas of the myocardium. Subtraction (SDS):  No evidence of ischemia. Transient Ischemic Dilatation (Normal <1.22):  1.03 Lung/Heart Ratio (Normal <0.45):  .25  Quantitative Gated Spect Images QGS EDV:  80 ml QGS ESV:  25 ml QGS cine images:  Normal Wall Motion QGS EF: 68%  Impression Exercise Capacity:  Lexiscan with no exercise. BP Response:  Normal blood pressure response. Clinical Symptoms:  SOB ECG Impression:  No significant ST segment change suggestive of ischemia. Comparison with Prior Nuclear Study: No images to compare  Overall Impression:  Normal stress nuclear study.  Jerral Bonito, MD

## 2011-04-13 ENCOUNTER — Other Ambulatory Visit (HOSPITAL_COMMUNITY): Payer: Self-pay | Admitting: Radiology

## 2011-04-13 DIAGNOSIS — R079 Chest pain, unspecified: Secondary | ICD-10-CM

## 2011-04-13 NOTE — Progress Notes (Signed)
Please inform patient stress test normal. Tereso Newcomer, PA-C  5:51 PM 04/13/2011

## 2011-04-14 ENCOUNTER — Encounter: Payer: BC Managed Care – PPO | Admitting: Physician Assistant

## 2011-04-14 NOTE — ED Provider Notes (Signed)
Order(s) created erroneously. Erroneous order ID: [id number] Order moved by: Welford Roche Order move date/time: 04/14/2011 10:19 AM Source Patient:    [id number] Source Contact: 04/13/2011 Destination Patient:    [id number] Destination Contact: 04/12/2011

## 2011-04-14 NOTE — ED Provider Notes (Signed)
Order(s) created erroneously. Erroneous order ID: [id number] Order moved by: DOHERTY-CARBONE, Freddye Cardamone Order move date/time: 04/14/2011 10:19 AM Source Patient:    [id number] Source Contact: 04/13/2011 Destination Patient:    [id number] Destination Contact: 04/12/2011

## 2011-04-22 ENCOUNTER — Ambulatory Visit (INDEPENDENT_AMBULATORY_CARE_PROVIDER_SITE_OTHER): Payer: BC Managed Care – PPO | Admitting: Physician Assistant

## 2011-04-22 ENCOUNTER — Encounter: Payer: Self-pay | Admitting: Physician Assistant

## 2011-04-22 VITALS — BP 138/80 | HR 71 | Resp 18 | Ht 65.0 in | Wt 198.1 lb

## 2011-04-22 DIAGNOSIS — E785 Hyperlipidemia, unspecified: Secondary | ICD-10-CM

## 2011-04-22 DIAGNOSIS — I10 Essential (primary) hypertension: Secondary | ICD-10-CM

## 2011-04-22 DIAGNOSIS — R079 Chest pain, unspecified: Secondary | ICD-10-CM

## 2011-04-22 DIAGNOSIS — K7689 Other specified diseases of liver: Secondary | ICD-10-CM | POA: Insufficient documentation

## 2011-04-22 NOTE — Assessment & Plan Note (Signed)
Blood pressure controlled.  Followup with PCP.

## 2011-04-22 NOTE — Progress Notes (Signed)
[address] [place], [place]  [address] Phone: [phone] Fax:  [phone]  Date:  04/22/2011   Name:  Brenda Flores       DOB:  1945-09-13 MRN:  [id number]  PCP:  Dr. Allyne Gee Primary Cardiologist:  Dr. Lewayne Bunting    History of Present Illness: Brenda Flores is a 66 y.o. female who presents for post hosp follow up.  She has a h/o non-obs CAD by cath in 7/07 (mLAD 30-40%, EF 65%).  Other hx includes HTN, HLP and glucose intol.  She was admitted 1/16-1/21 for chest pain.  Seen by Dr. Lewayne Bunting in consultation.  She had multiple other problems addressed during that admxn, including uncontrolled BP, headaches, left knee pain, low back pain.  MI was ruled out.  Echo 04/07/11:  Mild LVH, mild focal basal septal hypertrophy, EF 55-60%, grade 1 diast dysfxn, MAC, ? Liver cyst (dedicated abd u/s recommended).  She was set up for OP Myoview (done 04/12/11): EF 68%, no ischemia.  Labs: TC 222, TG 154, HDL 42, LDL 149, Hg 12.3, K 4, creatinine 0.79.  Chest CT: no pulmo emboli, polycystic liver.    Doing well.  She notes chest discomfort with being in a crowd.  Otherwise, the patient denies chest pain, shortness of breath, syncope, orthopnea, PND or significant pedal edema.   Past Medical History  Diagnosis Date  . Hypertension     Echo 04/07/11:  Mild LVH, mild focal basal septal hypertrophy, EF 55-60%, grade 1 diast dysfxn, MAC, ? Liver cyst (dedicated abd u/s recommended).  [name] HLD (hyperlipidemia)   . Glucose intolerance (impaired glucose tolerance)   . DJD (degenerative joint disease)   . CAD (coronary artery disease)     non-obs CAD by cath in 7/07 (mLAD 30-40%, EF 65%)  . Liver cyst   . Chest pain     Myoview (done 04/12/11): EF 68%, no ischemia.    Current Outpatient Prescriptions  Medication Sig Dispense Refill  . amLODipine (NORVASC) 10 MG tablet Take 1 tablet (10 mg total) by mouth daily.  30 tablet  0  . calcium carbonate (OS-CAL -  DOSED IN MG OF ELEMENTAL CALCIUM) 1250 MG tablet Take 1 tablet by mouth daily.      . cholecalciferol (VITAMIN D) 1000 UNITS tablet Take 1,000 Units by mouth daily.      [name] lisinopril (PRINIVIL,ZESTRIL) 20 MG tablet Take 1 tablet (20 mg total) by mouth daily.  30 tablet  0  . pregabalin (LYRICA) 50 MG capsule Take 1 capsule (50 mg total) by mouth 2 (two) times daily.  60 capsule  0  . simvastatin (ZOCOR) 10 MG tablet Take 1 tablet (10 mg total) by mouth daily at 6 PM.  30 tablet  0    Allergies: Allergies  Allergen Reactions  . Darvocet (Propoxyphene N-Acetaminophen) Nausea And Vomiting  . Zofran Itching    History  Substance Use Topics  . Smoking status: Never Smoker   . Smokeless tobacco: Not on file  . Alcohol Use: No     PHYSICAL EXAM: VS:  BP 138/80  Pulse 71  Resp 18  Ht 5\' 5"  (1.651 m)  Wt 198 lb 1.9 oz (89.867 kg)  BMI 32.97 kg/m2 Well nourished, well developed, in no acute distress HEENT: normal Neck: no JVD Cardiac:  normal S1, S2; RRR; no murmur Lungs:  clear to auscultation bilaterally, no wheezing, rhonchi or rales Abd: soft, nontender, no hepatomegaly Ext: no edema Skin: warm  and dry Neuro:  CNs 2-12 intact, no focal abnormalities noted  EKG:  Sinus rhythm, heart rate 71, left axis deviation, nonspecific ST-T wave changes  ASSESSMENT AND PLAN:

## 2011-04-22 NOTE — Assessment & Plan Note (Signed)
As noted, her Myoview was low risk.  Her symptoms are not related to coronary disease.  She does describe some symptoms of chest discomfort while being in a crowd.  She may want to discuss this with her PCP (question anxiety).  She can followup with Dr. Ladona Ridgel as needed.

## 2011-04-22 NOTE — Assessment & Plan Note (Signed)
Managed by primary care. 

## 2011-04-22 NOTE — Assessment & Plan Note (Signed)
She notes that this has been worked up in the past.  She can further followup with her PCP.

## 2011-04-22 NOTE — Patient Instructions (Signed)
  Your physician recommends that you schedule a follow-up appointment in: YOU MAY FOLLOW UP AS NEEDED PER SCOTT WEAVER, PA-C  NO CHANGES AT THIS TIME

## 2011-04-26 ENCOUNTER — Encounter (INDEPENDENT_AMBULATORY_CARE_PROVIDER_SITE_OTHER): Payer: Self-pay | Admitting: General Surgery

## 2011-04-26 ENCOUNTER — Ambulatory Visit (INDEPENDENT_AMBULATORY_CARE_PROVIDER_SITE_OTHER): Payer: BC Managed Care – PPO | Admitting: General Surgery

## 2011-04-26 VITALS — BP 136/84 | HR 78 | Temp 97.9°F | Resp 18 | Ht 65.0 in | Wt 198.8 lb

## 2011-04-26 DIAGNOSIS — D3A026 Benign carcinoid tumor of the rectum: Secondary | ICD-10-CM

## 2011-04-26 NOTE — Progress Notes (Signed)
Patient ID: Brenda Flores, female   DOB: 1945/11/22, 66 y.o.   MRN: [id number]  Chief Complaint  Patient presents with  . New Evaluation    Rectal carcinoma    HPI Brenda Flores is a 66 y.o. female.   HPI  She is referred by Dr. 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 She underwent screening colonoscopy March 17, 2011 and was found to have a small sessile polyp 5 cm up in the rectum. This was removed and cauterized. Final pathology demonstrated a well-differentiated carcinoid tumor with cells at the cauterized edge. I've been asked to see her for further evaluation of this. She has no lower gastrointestinal complaints.  Past Medical History  Diagnosis Date  . Hypertension     Echo 04/07/11:  Mild LVH, mild focal basal septal hypertrophy, EF 55-60%, grade 1 diast dysfxn, MAC, ? Liver cyst (dedicated abd u/s recommended).  [name] HLD (hyperlipidemia)   . Glucose intolerance (impaired glucose tolerance)   . DJD (degenerative joint disease)   . CAD (coronary artery disease)     non-obs CAD by cath in 7/07 (mLAD 30-40%, EF 65%)  . Liver cyst   . Chest pain     Myoview (done 04/12/11): EF 68%, no ischemia.  . Osteoporosis   . Leg swelling   . Rectal carcinoma     Past Surgical History  Procedure Date  . Back surgery   . Abdominal hysterectomy 1978  . Heel spur surgery 2003    left    Family History  Problem Relation Age of Onset  . Cancer Mother     breast  . Cancer Father     colon    Social History History  Substance Use Topics  . Smoking status: Never Smoker   . Smokeless tobacco: Never Used  . Alcohol Use: No    Allergies  Allergen Reactions  . Darvocet (Propoxyphene N-Acetaminophen) Nausea And Vomiting  . Zofran Itching    Current Outpatient Prescriptions  Medication Sig Dispense Refill  . amLODipine (NORVASC) 10 MG tablet Take 1 tablet (10 mg total) by mouth daily.  30 tablet  0  . calcium carbonate (OS-CAL - DOSED  IN MG OF ELEMENTAL CALCIUM) 1250 MG tablet Take 1 tablet by mouth as needed.       . cholecalciferol (VITAMIN D) 1000 UNITS tablet Take 1,000 Units by mouth daily.      . simvastatin (ZOCOR) 10 MG tablet Take 1 tablet (10 mg total) by mouth daily at 6 PM.  30 tablet  0    Review of Systems Review of Systems  Constitutional: Negative for fever and chills.  Respiratory: Negative for cough.   Cardiovascular: Positive for leg swelling. Negative for chest pain.  Gastrointestinal: Negative.   Genitourinary: Negative for hematuria and difficulty urinating.    Blood pressure 136/84, pulse 78, temperature 97.9 F (36.6 C), temperature source Temporal, resp. rate 18, height 5\' 5"  (1.651 m), weight 198 lb 12.8 oz (90.175 kg).  Physical Exam Physical Exam  Constitutional: No distress.       Overweight.  Genitourinary:       No external lesions.  On digital rectal exam, no masses are felt there is no blood on the examining finger.  Anoscopy and proctoscopy were performed up to 8 cm and no lesions or scars were noted.    Data Reviewed Notes from Dr. Kenna Gilbert office.  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 CT scan was performed and there is no evidence of metastatic disease. There is no gross evidence of residual disease either on her exam today. I explained to her that this is a very indolent process and most people and I recommended just close endoscopic followup.    Plan    We'll refer back to Dr. Loreta Ave for repeat flexible sigmoidoscopy in a short interim.  She will need more frequent sigmoidoscopies to make sure this area does not recur. If it does not think a transanal excision would be the next best step.       Samuell Knoble J 04/26/2011, 1:33 PM

## 2011-04-26 NOTE — Patient Instructions (Signed)
You will need close surveillance of this area by Dr. Loreta Ave.

## 2011-05-30 ENCOUNTER — Other Ambulatory Visit: Payer: Self-pay | Admitting: Internal Medicine

## 2011-05-30 DIAGNOSIS — Z1231 Encounter for screening mammogram for malignant neoplasm of breast: Secondary | ICD-10-CM

## 2011-07-11 ENCOUNTER — Encounter (INDEPENDENT_AMBULATORY_CARE_PROVIDER_SITE_OTHER): Payer: Self-pay

## 2011-07-11 ENCOUNTER — Encounter (INDEPENDENT_AMBULATORY_CARE_PROVIDER_SITE_OTHER): Payer: Self-pay | Admitting: Gastroenterology

## 2011-08-18 ENCOUNTER — Other Ambulatory Visit: Payer: Self-pay | Admitting: Family Medicine

## 2011-08-18 DIAGNOSIS — R935 Abnormal findings on diagnostic imaging of other abdominal regions, including retroperitoneum: Secondary | ICD-10-CM

## 2011-08-25 ENCOUNTER — Other Ambulatory Visit: Payer: BC Managed Care – PPO

## 2011-09-15 ENCOUNTER — Other Ambulatory Visit: Payer: BC Managed Care – PPO

## 2011-10-05 ENCOUNTER — Emergency Department (HOSPITAL_COMMUNITY): Payer: BC Managed Care – PPO

## 2011-10-05 ENCOUNTER — Encounter (HOSPITAL_COMMUNITY): Payer: Self-pay | Admitting: Emergency Medicine

## 2011-10-05 ENCOUNTER — Emergency Department (HOSPITAL_COMMUNITY)
Admission: EM | Admit: 2011-10-05 | Discharge: 2011-10-05 | Disposition: A | Discharge status: Home / Self Care | Payer: BC Managed Care – PPO | Attending: Emergency Medicine | Admitting: Emergency Medicine

## 2011-10-05 DIAGNOSIS — M810 Age-related osteoporosis without current pathological fracture: Secondary | ICD-10-CM | POA: Insufficient documentation

## 2011-10-05 DIAGNOSIS — R110 Nausea: Secondary | ICD-10-CM | POA: Insufficient documentation

## 2011-10-05 DIAGNOSIS — I10 Essential (primary) hypertension: Secondary | ICD-10-CM | POA: Insufficient documentation

## 2011-10-05 DIAGNOSIS — R51 Headache: Secondary | ICD-10-CM

## 2011-10-05 DIAGNOSIS — Z79899 Other long term (current) drug therapy: Secondary | ICD-10-CM | POA: Insufficient documentation

## 2011-10-05 DIAGNOSIS — I2510 Atherosclerotic heart disease of native coronary artery without angina pectoris: Secondary | ICD-10-CM | POA: Insufficient documentation

## 2011-10-05 DIAGNOSIS — E785 Hyperlipidemia, unspecified: Secondary | ICD-10-CM | POA: Insufficient documentation

## 2011-10-05 MED ORDER — PROMETHAZINE HCL 25 MG/ML IJ SOLN
25.0000 mg | Freq: Once | INTRAMUSCULAR | Status: AC
  Administered 2011-10-05: 25 mg via INTRAVENOUS
  Filled 2011-10-05: qty 1

## 2011-10-05 MED ORDER — FLUORESCEIN SODIUM 1 MG OP STRP
1.0000 | ORAL_STRIP | Freq: Once | OPHTHALMIC | Status: AC
  Administered 2011-10-05: 19:00:00 via OPHTHALMIC
  Filled 2011-10-05: qty 1

## 2011-10-05 MED ORDER — CLONIDINE HCL 0.1 MG PO TABS
0.1000 mg | ORAL_TABLET | Freq: Once | ORAL | Status: AC
  Administered 2011-10-05: 0.1 mg via ORAL
  Filled 2011-10-05: qty 1

## 2011-10-05 MED ORDER — TETRACAINE HCL 0.5 % OP SOLN
2.0000 [drp] | Freq: Once | OPHTHALMIC | Status: AC
  Administered 2011-10-05: 2 [drp] via OPHTHALMIC
  Filled 2011-10-05: qty 2

## 2011-10-05 NOTE — ED Provider Notes (Addendum)
History     CSN: [id number]  Arrival date & time 10/05/11  1711   First MD Initiated Contact with Patient 10/05/11 1750      Chief Complaint  Patient presents with  . Headache  . Nausea    (Consider location/radiation/quality/duration/timing/severity/associated sxs/prior treatment) Patient is a 66 y.o. female presenting with headaches. The history is provided by the patient.  Headache  This is a new problem. Associated symptoms include nausea. Pertinent negatives include no shortness of breath and no vomiting.   patient's had a left-sided headache for the last few days. It is a throbbing left-sided headache. She states her vision will order a little bit on the left side. No fevers. No trauma. No localizing numbness or weakness. No dizziness or lightheadedness. She does not typically have headaches. No fevers. No neck stiffness. She does not wear glasses.  Past Medical History  Diagnosis Date  . Hypertension     Echo 04/07/11:  Mild LVH, mild focal basal septal hypertrophy, EF 55-60%, grade 1 diast dysfxn, MAC, ? Liver cyst (dedicated abd u/s recommended).  [name] HLD (hyperlipidemia)   . Glucose intolerance (impaired glucose tolerance)   . DJD (degenerative joint disease)   . CAD (coronary artery disease)     non-obs CAD by cath in 7/07 (mLAD 30-40%, EF 65%)  . Liver cyst   . Chest pain     Myoview (done 04/12/11): EF 68%, no ischemia.  . Osteoporosis   . Leg swelling   . Rectal carcinoma     Past Surgical History  Procedure Date  . Back surgery   . Abdominal hysterectomy 1978  . Heel spur surgery 2003    left    Family History  Problem Relation Age of Onset  . Cancer Mother     breast  . Cancer Father     colon    History  Substance Use Topics  . Smoking status: Never Smoker   . Smokeless tobacco: Never Used  . Alcohol Use: No    OB History    Grav Para Term Preterm Abortions TAB SAB Ect Mult Living                  Review of Systems  Constitutional:  Negative for activity change and appetite change.  HENT: Negative for neck stiffness.   Eyes: Negative for pain.  Respiratory: Negative for chest tightness and shortness of breath.   Cardiovascular: Negative for chest pain and leg swelling.  Gastrointestinal: Positive for nausea. Negative for vomiting, abdominal pain and diarrhea.  Genitourinary: Negative for flank pain.  Musculoskeletal: Negative for back pain.  Skin: Negative for rash.  Neurological: Positive for headaches. Negative for weakness and numbness.  Psychiatric/Behavioral: Negative for behavioral problems.    Allergies  Darvocet and Zofran  Home Medications   Current Outpatient Rx  Name Route Sig Dispense Refill  . AMLODIPINE BESYLATE 5 MG PO TABS Oral Take 5 mg by mouth daily.    [name] CALCIUM CARBONATE 1250 MG PO TABS Oral Take 1 tablet by mouth as needed.     [name] VITAMIN D 1000 UNITS PO TABS Oral Take 1,000 Units by mouth daily.    . DEXLANSOPRAZOLE 60 MG PO CPDR Oral Take 60 mg by mouth daily.    [name] OLMESARTAN MEDOXOMIL-HCTZ 20-12.5 MG PO TABS Oral Take 1 tablet by mouth daily.    [name] OMEPRAZOLE MAGNESIUM 20 MG PO TBEC Oral Take 20 mg by mouth daily.      BP 153/94  Pulse  79  Temp 98.2 F (36.8 C) (Oral)  Resp 20  SpO2 98%  Physical Exam  Nursing note and vitals reviewed. Constitutional: She is oriented to person, place, and time. She appears well-developed and well-nourished.  HENT:  Head: Normocephalic and atraumatic.  Eyes: EOM are normal. Pupils are equal, round, and reactive to light.       No relief with tetracaine drops. Intraocular pressure 14 on left  Neck: Normal range of motion. Neck supple.  Cardiovascular: Normal rate, regular rhythm and normal heart sounds.   No murmur heard. Pulmonary/Chest: Effort normal and breath sounds normal. No respiratory distress. She has no wheezes. She has no rales.  Abdominal: Soft. Bowel sounds are normal. She exhibits no distension. There is no tenderness. There is no  rebound and no guarding.  Musculoskeletal: Normal range of motion.  Neurological: She is alert and oriented to person, place, and time. No cranial nerve deficit.  Skin: Skin is warm and dry.  Psychiatric: She has a normal mood and affect. Her speech is normal.    ED Course  Procedures (including critical care time)  Labs Reviewed - No data to display Ct Head Wo Contrast  10/05/2011  *RADIOLOGY REPORT*  Clinical Data: Headache with nausea.  Hypertension.  CT HEAD WITHOUT CONTRAST  Technique:  Contiguous axial images were obtained from the base of the skull through the vertex without contrast.  Comparison: 04/06/2011  Findings: There is no evidence for acute infarction, intracranial hemorrhage, mass lesion, hydrocephalus, or extra-axial fluid. Slight atrophy.  Mild chronic microvascular ischemic change. Remote right caudate lacunar infarct.  Calvarium intact.  Clear sinuses and mastoids.  No change from recent priors.  IMPRESSION: No acute intracranial findings.  Original Report Authenticated By: Elsie Stain, M.D.     1. Headache       MDM  Patient with left-sided headache. Negative CT. Patient feels better after treatment. Patient does have visual changes that will need to be followed in ophthalmology. CT scan was not on the contrasted patient will need to be followed with her history of malignancy. She'll be discharged home. Intraocular pressures were normal in the left eye   Date: 10/22/2011  Rate: 90  Rhythm: normal sinus rhythm  QRS Axis: normal  Intervals: normal  ST/T Wave abnormalities: normal  Conduction Disutrbances:LVH  Narrative Interpretation:   Old EKG Reviewed: unchanged        Lawren Sexson R. Rubin Payor, MD 10/06/11 [id number]  Juliet Rude Rubin Payor, MD 10/22/11 [phone]

## 2011-10-05 NOTE — ED Notes (Signed)
Bilateral 20/50, Left Eye 20/70, Right Eye 20/200

## 2011-10-05 NOTE — ED Notes (Addendum)
Pt here c/o h/a sent 7/14/20013 after church getting progressively worse now c/o nausea,but no emesis, lt eye pain with blurred vision called PCP and was told to come to ED H/A not relived with Tylenol EX

## 2011-10-05 NOTE — ED Notes (Signed)
Pt. In red socks and yellow arm bracelet

## 2011-10-05 NOTE — ED Notes (Signed)
Iv attempted x3 unsuccessful Iv team called

## 2011-10-15 ENCOUNTER — Emergency Department (HOSPITAL_COMMUNITY)
Admission: EM | Admit: 2011-10-15 | Discharge: 2011-10-15 | Disposition: A | Discharge status: Home / Self Care | Payer: BC Managed Care – PPO | Source: Home / Self Care | Attending: Emergency Medicine | Admitting: Emergency Medicine

## 2011-10-15 ENCOUNTER — Encounter (HOSPITAL_COMMUNITY): Payer: Self-pay | Admitting: Cardiology

## 2011-10-15 DIAGNOSIS — W57XXXA Bitten or stung by nonvenomous insect and other nonvenomous arthropods, initial encounter: Secondary | ICD-10-CM

## 2011-10-15 DIAGNOSIS — IMO0001 Reserved for inherently not codable concepts without codable children: Secondary | ICD-10-CM

## 2011-10-15 DIAGNOSIS — T148XXA Other injury of unspecified body region, initial encounter: Secondary | ICD-10-CM

## 2011-10-15 MED ORDER — CEPHALEXIN 500 MG PO CAPS: 500.0000 mg | ORAL_CAPSULE | Freq: Three times a day (TID) | ORAL | Status: AC

## 2011-10-15 MED ORDER — SULFAMETHOXAZOLE-TMP DS 800-160 MG PO TABS: 2.0000 | ORAL_TABLET | Freq: Two times a day (BID) | ORAL | Status: AC

## 2011-10-15 MED ORDER — TRAMADOL HCL 50 MG PO TABS: 100.0000 mg | ORAL_TABLET | Freq: Three times a day (TID) | ORAL | Status: AC | PRN

## 2011-10-15 NOTE — ED Provider Notes (Signed)
Chief Complaint  Patient presents with  . Insect Bite  . Fever    History of Present Illness:   [name] is a 66 year old female who was stung by a bee on the left lateral thigh 3 days ago. Ever since then it's been red, painful, and itchy. The area of erythema seems to be increasing in size. Last night she had a fever of 101. She denies any other obvious source of the fever such as URI symptoms, cough, abdominal pain, nausea, vomiting, diarrhea, or urinary symptoms. She has no rash elsewhere on her body. She denies any difficulty breathing. Several years ago she was stung by yellow jacket had some difficulty breathing and had an EpiPen with her for a while.  Review of Systems:  Other than noted above, the patient denies any of the following symptoms: Systemic:  No fever, chills, sweats, weight loss, or fatigue. ENT:  No nasal congestion, rhinorrhea, sore throat, swelling of lips, tongue or throat. Resp:  No cough, wheezing, or shortness of breath. Skin:  No rash, itching, nodules, or suspicious lesions.  PMFSH:  Past medical history, family history, social history, meds, and allergies were reviewed.  Physical Exam:   Vital signs:  BP 175/97  Pulse 64  Temp 98.3 F (36.8 C) (Oral)  Resp 16  Wt 198 lb (89.812 kg)  SpO2 97% Gen:  Alert, oriented, in no distress. ENT:  Pharynx clear, no intraoral lesions, moist mucous membranes. Lungs:  Clear to auscultation. Skin:  There is a 4 cm, rounded area of nonblanching erythema on the left lateral thigh. This was somewhat tender to palpation but not indurated. There was no purulent drainage. Skin was otherwise clear.    Assessment:  The encounter diagnosis was Infected insect bite or sting.  Plan:   1.  The following meds were prescribed:   New Prescriptions   CEPHALEXIN (KEFLEX) 500 MG CAPSULE    Take 1 capsule (500 mg total) by mouth 3 (three) times daily.   SULFAMETHOXAZOLE-TRIMETHOPRIM (BACTRIM DS) 800-160 MG PER TABLET    Take 2  tablets by mouth 2 (two) times daily.   TRAMADOL (ULTRAM) 50 MG TABLET    Take 2 tablets (100 mg total) by mouth every 8 (eight) hours as needed for pain.   2.  The patient was instructed in symptomatic care and handouts were given. 3.  The patient was told to return if becoming worse in any way, if no better in 3 or 4 days, and given some red flag symptoms that would indicate earlier return.     Reuben Likes, MD 10/15/11 1355

## 2011-10-15 NOTE — ED Notes (Signed)
Pt reports bee sting to left lateral thigh that happened this past Thursday evening. Pt has a history of being stung and having an allergic reaction of shortness breath. This episode the pt developed small red are to the lateral side of the thigh that has become larger that now measures 4cm in diameter.  The area is now sore to the touch and was "fevered" last night and oral temp was 101. Pt took tylenol last PM with relief. Denies fever today. She has not taken her BP med this AM.

## 2012-04-05 ENCOUNTER — Encounter (HOSPITAL_COMMUNITY): Payer: Self-pay | Admitting: *Deleted

## 2012-04-05 ENCOUNTER — Emergency Department (HOSPITAL_COMMUNITY)
Admission: EM | Admit: 2012-04-05 | Discharge: 2012-04-05 | Disposition: A | Discharge status: Home / Self Care | Payer: Medicare Other | Source: Home / Self Care | Attending: Family Medicine | Admitting: Family Medicine

## 2012-04-05 ENCOUNTER — Observation Stay (HOSPITAL_COMMUNITY)
Admission: EM | Admit: 2012-04-05 | Discharge: 2012-04-06 | Disposition: A | Discharge status: Home / Self Care | Payer: Medicare Other | Attending: Internal Medicine | Admitting: Internal Medicine

## 2012-04-05 ENCOUNTER — Emergency Department (HOSPITAL_COMMUNITY): Payer: Medicare Other

## 2012-04-05 DIAGNOSIS — E785 Hyperlipidemia, unspecified: Secondary | ICD-10-CM | POA: Insufficient documentation

## 2012-04-05 DIAGNOSIS — R079 Chest pain, unspecified: Secondary | ICD-10-CM

## 2012-04-05 DIAGNOSIS — I10 Essential (primary) hypertension: Secondary | ICD-10-CM | POA: Insufficient documentation

## 2012-04-05 DIAGNOSIS — I5030 Unspecified diastolic (congestive) heart failure: Secondary | ICD-10-CM

## 2012-04-05 DIAGNOSIS — I5032 Chronic diastolic (congestive) heart failure: Secondary | ICD-10-CM | POA: Insufficient documentation

## 2012-04-05 DIAGNOSIS — M79605 Pain in left leg: Secondary | ICD-10-CM

## 2012-04-05 DIAGNOSIS — M79609 Pain in unspecified limb: Secondary | ICD-10-CM

## 2012-04-05 DIAGNOSIS — I2510 Atherosclerotic heart disease of native coronary artery without angina pectoris: Secondary | ICD-10-CM

## 2012-04-05 DIAGNOSIS — K219 Gastro-esophageal reflux disease without esophagitis: Secondary | ICD-10-CM | POA: Insufficient documentation

## 2012-04-05 DIAGNOSIS — R0789 Other chest pain: Principal | ICD-10-CM | POA: Insufficient documentation

## 2012-04-05 DIAGNOSIS — Z8679 Personal history of other diseases of the circulatory system: Secondary | ICD-10-CM | POA: Insufficient documentation

## 2012-04-05 DIAGNOSIS — M5416 Radiculopathy, lumbar region: Secondary | ICD-10-CM

## 2012-04-05 DIAGNOSIS — I509 Heart failure, unspecified: Secondary | ICD-10-CM | POA: Insufficient documentation

## 2012-04-05 DIAGNOSIS — IMO0002 Reserved for concepts with insufficient information to code with codable children: Secondary | ICD-10-CM | POA: Insufficient documentation

## 2012-04-05 DIAGNOSIS — M810 Age-related osteoporosis without current pathological fracture: Secondary | ICD-10-CM | POA: Insufficient documentation

## 2012-04-05 LAB — POCT I-STAT, CHEM 8
BUN: 13 mg/dL (ref 6–23)
Calcium, Ion: 1.17 mmol/L (ref 1.13–1.30)
Creatinine, Ser: 0.8 mg/dL (ref 0.50–1.10)
Glucose, Bld: 84 mg/dL (ref 70–99)
Sodium: 143 mEq/L (ref 135–145)
TCO2: 27 mmol/L (ref 0–100)

## 2012-04-05 LAB — CBC
HCT: 38.2 % (ref 36.0–46.0)
Hemoglobin: 12.7 g/dL (ref 12.0–15.0)
MCH: 27.5 pg (ref 26.0–34.0)
MCV: 82.7 fL (ref 78.0–100.0)
RBC: 4.62 MIL/uL (ref 3.87–5.11)

## 2012-04-05 LAB — POCT I-STAT TROPONIN I

## 2012-04-05 MED ORDER — PANTOPRAZOLE SODIUM 40 MG IV SOLR
40.0000 mg | Freq: Two times a day (BID) | INTRAVENOUS | Status: DC
  Administered 2012-04-06 (×2): 40 mg via INTRAVENOUS
  Filled 2012-04-05 (×3): qty 40

## 2012-04-05 MED ORDER — SODIUM CHLORIDE 0.9 % IJ SOLN
3.0000 mL | Freq: Two times a day (BID) | INTRAMUSCULAR | Status: DC
  Administered 2012-04-05 – 2012-04-06 (×2): 3 mL via INTRAVENOUS

## 2012-04-05 MED ORDER — MORPHINE SULFATE 4 MG/ML IJ SOLN
4.0000 mg | Freq: Once | INTRAMUSCULAR | Status: AC
  Administered 2012-04-05: 4 mg via INTRAVENOUS
  Filled 2012-04-05: qty 1

## 2012-04-05 MED ORDER — PROMETHAZINE HCL 25 MG/ML IJ SOLN
25.0000 mg | Freq: Four times a day (QID) | INTRAMUSCULAR | Status: DC | PRN
  Administered 2012-04-05: 25 mg via INTRAVENOUS
  Filled 2012-04-05: qty 1

## 2012-04-05 MED ORDER — AMLODIPINE BESYLATE 5 MG PO TABS
5.0000 mg | ORAL_TABLET | Freq: Once | ORAL | Status: AC
  Administered 2012-04-05: 5 mg via ORAL
  Filled 2012-04-05: qty 1

## 2012-04-05 MED ORDER — HYDROCHLOROTHIAZIDE 12.5 MG PO CAPS
12.5000 mg | ORAL_CAPSULE | Freq: Every day | ORAL | Status: DC
  Administered 2012-04-06: 12.5 mg via ORAL
  Filled 2012-04-05: qty 1

## 2012-04-05 MED ORDER — ZOLPIDEM TARTRATE 5 MG PO TABS: 5.0000 mg | ORAL_TABLET | Freq: Every evening | ORAL | Status: DC | PRN

## 2012-04-05 MED ORDER — SODIUM CHLORIDE 0.9 % IV SOLN: 250.0000 mL | INTRAVENOUS | Status: DC | PRN

## 2012-04-05 MED ORDER — ONDANSETRON HCL 8 MG PO TABS: 4.0000 mg | ORAL_TABLET | Freq: Four times a day (QID) | ORAL | Status: DC | PRN

## 2012-04-05 MED ORDER — PANTOPRAZOLE SODIUM 40 MG PO TBEC: 40.0000 mg | DELAYED_RELEASE_TABLET | Freq: Every day | ORAL | Status: DC

## 2012-04-05 MED ORDER — ENOXAPARIN SODIUM 40 MG/0.4ML ~~LOC~~ SOLN
40.0000 mg | SUBCUTANEOUS | Status: DC
  Administered 2012-04-06: 40 mg via SUBCUTANEOUS
  Filled 2012-04-05: qty 0.4

## 2012-04-05 MED ORDER — ONDANSETRON HCL 4 MG/2ML IJ SOLN: 4.0000 mg | Freq: Four times a day (QID) | INTRAMUSCULAR | Status: DC | PRN

## 2012-04-05 MED ORDER — AMLODIPINE BESYLATE 5 MG PO TABS
5.0000 mg | ORAL_TABLET | Freq: Every day | ORAL | Status: DC
  Administered 2012-04-06: 5 mg via ORAL
  Filled 2012-04-05: qty 1

## 2012-04-05 MED ORDER — ASPIRIN 325 MG PO TABS
325.0000 mg | ORAL_TABLET | Freq: Once | ORAL | Status: AC
  Administered 2012-04-05: 325 mg via ORAL
  Filled 2012-04-05: qty 1

## 2012-04-05 MED ORDER — PROMETHAZINE HCL 25 MG/ML IJ SOLN
25.0000 mg | Freq: Three times a day (TID) | INTRAMUSCULAR | Status: DC | PRN
  Filled 2012-04-05: qty 1

## 2012-04-05 MED ORDER — OLMESARTAN MEDOXOMIL-HCTZ 20-12.5 MG PO TABS: 1.0000 | ORAL_TABLET | Freq: Every day | ORAL | Status: DC

## 2012-04-05 MED ORDER — MORPHINE SULFATE 2 MG/ML IJ SOLN
2.0000 mg | INTRAMUSCULAR | Status: DC | PRN
  Administered 2012-04-06: 2 mg via INTRAVENOUS
  Filled 2012-04-05: qty 1

## 2012-04-05 MED ORDER — DOCUSATE SODIUM 100 MG PO CAPS
100.0000 mg | ORAL_CAPSULE | Freq: Two times a day (BID) | ORAL | Status: DC
  Administered 2012-04-06 (×2): 100 mg via ORAL
  Filled 2012-04-05 (×3): qty 1

## 2012-04-05 MED ORDER — SENNA 8.6 MG PO TABS
1.0000 | ORAL_TABLET | Freq: Two times a day (BID) | ORAL | Status: DC
  Administered 2012-04-06 (×2): 8.6 mg via ORAL
  Filled 2012-04-05 (×3): qty 1

## 2012-04-05 MED ORDER — ACETAMINOPHEN 650 MG RE SUPP: 650.0000 mg | Freq: Four times a day (QID) | RECTAL | Status: DC | PRN

## 2012-04-05 MED ORDER — SODIUM CHLORIDE 0.9 % IJ SOLN: 3.0000 mL | Freq: Two times a day (BID) | INTRAMUSCULAR | Status: DC

## 2012-04-05 MED ORDER — ALUM & MAG HYDROXIDE-SIMETH 200-200-20 MG/5ML PO SUSP: 30.0000 mL | Freq: Four times a day (QID) | ORAL | Status: DC | PRN

## 2012-04-05 MED ORDER — IRBESARTAN 150 MG PO TABS
150.0000 mg | ORAL_TABLET | Freq: Every day | ORAL | Status: DC
  Administered 2012-04-06: 150 mg via ORAL
  Filled 2012-04-05 (×2): qty 1

## 2012-04-05 MED ORDER — ASPIRIN EC 81 MG PO TBEC
81.0000 mg | DELAYED_RELEASE_TABLET | Freq: Every day | ORAL | Status: DC
  Administered 2012-04-06: 81 mg via ORAL
  Filled 2012-04-05: qty 1

## 2012-04-05 MED ORDER — OXYCODONE-ACETAMINOPHEN 5-325 MG PO TABS
2.0000 | ORAL_TABLET | Freq: Once | ORAL | Status: AC
  Administered 2012-04-05: 2 via ORAL
  Filled 2012-04-05: qty 2

## 2012-04-05 MED ORDER — ACETAMINOPHEN 325 MG PO TABS
650.0000 mg | ORAL_TABLET | Freq: Four times a day (QID) | ORAL | Status: DC | PRN
  Administered 2012-04-06: 650 mg via ORAL
  Filled 2012-04-05: qty 2

## 2012-04-05 MED ORDER — HYDRALAZINE HCL 20 MG/ML IJ SOLN: 10.0000 mg | Freq: Three times a day (TID) | INTRAMUSCULAR | Status: DC | PRN

## 2012-04-05 MED ORDER — OXYCODONE HCL 5 MG PO TABS
5.0000 mg | ORAL_TABLET | ORAL | Status: DC | PRN
  Administered 2012-04-06: 5 mg via ORAL
  Filled 2012-04-05: qty 1

## 2012-04-05 MED ORDER — SODIUM CHLORIDE 0.9 % IJ SOLN: 3.0000 mL | INTRAMUSCULAR | Status: DC | PRN

## 2012-04-05 NOTE — ED Provider Notes (Signed)
History     CSN: [id number]  Arrival date & time 04/05/12  1143   First MD Initiated Contact with Patient 04/05/12 1203      Chief Complaint  Patient presents with  . Chest Pain    (Consider location/radiation/quality/duration/timing/severity/associated sxs/prior treatment) HPI Comments: 67 year old female with history of hyperlipidemia, glucose intolerance and coronary artery disease (nonischemic by catheterization in 2007 and Myoview in January 2013) here complaining of chest pain associated with shortness of breath since 9 PM last night. Denies pain radiation. No diaphoresis. Symptoms also associated with headache. Denies abdominal pain, nausea vomiting or diarrhea. Patient reports pain of her left lower leg and thigh and bruising in the left lower leg. Described as pain in the left upper tightness and burning in character. Denies difficulty moving her extremities. No visual changes.    Past Medical History  Diagnosis Date  . Hypertension     Echo 04/07/11:  Mild LVH, mild focal basal septal hypertrophy, EF 55-60%, grade 1 diast dysfxn, MAC, ? Liver cyst (dedicated abd u/s recommended).  [name] HLD (hyperlipidemia)   . Glucose intolerance (impaired glucose tolerance)   . DJD (degenerative joint disease)   . CAD (coronary artery disease)     non-obs CAD by cath in 7/07 (mLAD 30-40%, EF 65%)  . Liver cyst   . Chest pain     Myoview (done 04/12/11): EF 68%, no ischemia.  . Osteoporosis   . Leg swelling   . Rectal carcinoma     Past Surgical History  Procedure Date  . Back surgery   . Abdominal hysterectomy 1978  . Heel spur surgery 2003    left    Family History  Problem Relation Age of Onset  . Cancer Mother     breast  . Cancer Father     colon    History  Substance Use Topics  . Smoking status: Never Smoker   . Smokeless tobacco: Never Used  . Alcohol Use: No    OB History    Grav Para Term Preterm Abortions TAB SAB Ect Mult Living                  Review  of Systems  Constitutional: Negative for fever and chills.  HENT: Negative for congestion.   Eyes: Negative for visual disturbance.  Respiratory: Positive for shortness of breath. Negative for cough and wheezing.   Cardiovascular: Positive for chest pain. Negative for leg swelling.  Gastrointestinal: Negative for nausea, vomiting, abdominal pain and diarrhea.  Musculoskeletal: Negative for gait problem.  Neurological: Positive for numbness and headaches. Negative for dizziness, seizures and weakness.    Allergies  Darvocet and Zofran  Home Medications   Current Outpatient Rx  Name  Route  Sig  Dispense  Refill  . AMLODIPINE BESYLATE 5 MG PO TABS   Oral   Take 5 mg by mouth daily.         [name] CALCIUM CARBONATE 1250 MG PO TABS   Oral   Take 1 tablet by mouth as needed.          [name] VITAMIN D 1000 UNITS PO TABS   Oral   Take 1,000 Units by mouth daily.         . DEXLANSOPRAZOLE 60 MG PO CPDR   Oral   Take 60 mg by mouth daily.         [name] OLMESARTAN MEDOXOMIL-HCTZ 20-12.5 MG PO TABS   Oral   Take 1 tablet by mouth daily.         [name]  OMEPRAZOLE MAGNESIUM 20 MG PO TBEC   Oral   Take 20 mg by mouth daily.           BP 177/108  Pulse 68  Temp 98.6 F (37 C) (Oral)  Resp 18  SpO2 98%  Physical Exam  Nursing note and vitals reviewed. Constitutional: She is oriented to person, place, and time. She appears well-developed and well-nourished.       Appears uncomfortable  HENT:  Head: Normocephalic and atraumatic.  Mouth/Throat: Oropharynx is clear and moist.  Eyes: Conjunctivae normal and EOM are normal. Pupils are equal, round, and reactive to light. No scleral icterus.  Neck: Neck supple. No JVD present. No thyromegaly present.  Cardiovascular: Normal rate, regular rhythm, normal heart sounds and intact distal pulses.  Exam reveals no gallop and no friction rub.   No murmur heard.      No low extremity edema  Pulmonary/Chest: Effort normal and breath sounds  normal. No respiratory distress. She has no wheezes. She has no rales. She exhibits no tenderness.  Abdominal: Soft. There is no tenderness.  Lymphadenopathy:    She has no cervical adenopathy.  Neurological: She is alert and oriented to person, place, and time. She has normal strength. No cranial nerve deficit. She exhibits normal muscle tone. GCS eye subscore is 4. GCS verbal subscore is 5. GCS motor subscore is 6.       No face drop. Tongue central. No arm drop.    Skin: No rash noted. She is not diaphoretic.    ED Course  Procedures (including critical care time)   Labs Reviewed  GLUCOSE, CAPILLARY   No results found.   1. Chest pain   2. Hypertension     EKG: Normal sinus rhythm with a ventricular rate 69 beats per minute, LVH signs, and no ST or other acute ischemic changes.   MDM  67 year old female with history of hyperlipidemia, glucose intolerance and coronary artery disease (nonischemic by catheterization in 2007 and Myoview in January 2013) here complaining of chest pain associated with shortness of breath since 9 PM last night.On exam: Patient is calmed. Hypertensive with a blood pressure at 177/108, no acute respiratory distress with a respiratory rate at 18 and oxygen saturation 98%. Regular rate or rhythm with no murmurs or rubs, clear lungs. No low extremity edema. No hemiparesis or neurologic focalizations. Capillary glucose 83. Decided to transfer to the emergency department for further evaluation and management.         Sharin Grave, MD 04/05/12 1251

## 2012-04-05 NOTE — ED Notes (Signed)
Pt  Reports  Chest  Pain  Which   Started  yest        Pt  Reports    Dull    Pain  Across  The    Chest      Pt  Also  Reports  Shortness       Of  Breath   Pt    Reports   Pain  Scale  Of  9      She  Also  Has  Mitral  Valve  Prolapse

## 2012-04-05 NOTE — Consult Note (Signed)
Patient Name: Brenda Flores  MRN: [id number]  HPI: Brenda Flores is an 67 y.o. female referred for consultation by Dr.Donald Forestine Chute, MD for evaluation of chest pain. This nice woman has been seen intermittently by [facility] cardiology over the past 6 years for recurrent chest discomfort. Cardiac catheterization in 2007 revealed insignificant coronary artery disease  with the most prominent lesion being a 30-40% stenosis of the mid LAD.  The other vessels were angiographically normal.  She was most recently admitted to hospital in early 2013 with chest discomfort and spent 5 days on our service. A subsequent nuclear stress test was entirely negative.  Cardiac history dates back decades  when she was a patient of Dr. Hali Marry with a diagnosis of mitral valve prolapse. Cardiac catheterization on 3 occasions apparently revealed no abnormalities.  The current episode began approximately 24 hours ago with the sudden onset of severe left leg pain, which he describes as a burning in the thigh, numbness in the anterior thigh and deep muscle pain. She noted a small ecchymoses over the shin, but does not know the origin of that lesion.  There is pain with movement, and she is having difficulty walking. She describes a radiation of that pain up through the abdomen and eventually settling in her chest. She has had some associated dyspnea but no diaphoresis. She notes nausea but no emesis. In recent hours, she is also developed a severe diffuse headache.  Cardiac markers have been negative over the first 7 hours of her care. EKGs show nonspecific T wave abnormalities, particularly inferiorly, that are more prominent than on prior tracings.  Past Medical History  Diagnosis Date  . Hypertension     Echo 04/07/11:  Mild LVH, mild focal basal septal hypertrophy, EF 55-60%, grade 1 diast dysfxn, MAC, ? Liver cyst (dedicated abd u/s recommended).  [name] HLD (hyperlipidemia)   . Glucose intolerance  (impaired glucose tolerance)   . DJD (degenerative joint disease)   . CAD (coronary artery disease)     non-obs CAD by cath in 7/07 (mLAD 30-40%, EF 65%)  . Liver cyst   . Chest pain     Myoview (done 04/12/11): EF 68%, no ischemia.  . Osteoporosis   . Leg swelling   . Rectal carcinoma    Past Surgical History  Procedure Date  . Back surgery   . Abdominal hysterectomy 1978  . Heel spur surgery 2003    left   Family History  Problem Relation Age of Onset  . Cancer Mother     breast  . Cancer Father     colon   Social History:  reports that she has never smoked. She has never used smokeless tobacco. She reports that she does not drink alcohol or use illicit drugs.  Allergies:  Allergies  Allergen Reactions  . Darvocet (Propoxyphene-Acetaminophen) Nausea And Vomiting  . Zofran Itching   Medications: I have reviewed the patient's current medications. No current facility-administered medications for this encounter.   Current Outpatient Prescriptions  Medication Sig Dispense Refill  . amLODipine (NORVASC) 5 MG tablet Take 5 mg by mouth daily.      [name] esomeprazole (NEXIUM) 40 MG capsule Take 40 mg by mouth daily before breakfast.      . olmesartan-hydrochlorothiazide (BENICAR HCT) 20-12.5 MG per tablet Take 1 tablet by mouth daily.       Results for orders placed during the hospital encounter of 04/05/12 (from the past 48 hour(s))  D-DIMER, QUANTITATIVE  Status: Abnormal   Collection Time   04/05/12  3:08 PM      Component Value Range Comment   D-Dimer, Quant 1.49 (*) 0.00 - 0.48 ug/mL-FEU   CBC     Status: Abnormal   Collection Time   04/05/12  3:08 PM      Component Value Range Comment   WBC 6.3  4.0 - 10.5 K/uL    RBC 4.62  3.87 - 5.11 MIL/uL    Hemoglobin 12.7  12.0 - 15.0 g/dL    HCT 16.1  09.6 - 04.5 %    MCV 82.7  78.0 - 100.0 fL    MCH 27.5  26.0 - 34.0 pg    MCHC 33.2  30.0 - 36.0 g/dL    RDW 40.9 (*) 81.1 - 15.5 %    Platelets 204  150 - 400 K/uL   POCT  I-STAT TROPONIN I     Status: Normal   Collection Time   04/05/12  3:26 PM      Component Value Range Comment   Troponin i, poc 0.00  0.00 - 0.08 ng/mL    Comment 3            POCT I-STAT, CHEM 8     Status: Normal   Collection Time   04/05/12  3:29 PM      Component Value Range Comment   Sodium 143  135 - 145 mEq/L    Potassium 3.8  3.5 - 5.1 mEq/L    Chloride 105  96 - 112 mEq/L    BUN 13  6 - 23 mg/dL    Creatinine, Ser 9.14  0.50 - 1.10 mg/dL    Glucose, Bld 84  70 - 99 mg/dL    Calcium, Ion 7.82  9.56 - 1.30 mmol/L    TCO2 27  0 - 100 mmol/L    Hemoglobin 13.3  12.0 - 15.0 g/dL    HCT 21.3  08.6 - 57.8 %   POCT I-STAT TROPONIN I     Status: Normal   Collection Time   04/05/12  6:17 PM      Component Value Range Comment   Troponin i, poc 0.00  0.00 - 0.08 ng/mL    Comment 3             Dg Chest 2 View  04/05/2012  *RADIOLOGY REPORT*  Clinical Data: Chest pain.  Left leg pain and swelling. Hypertension and coronary artery disease.  CHEST - 2 VIEW  Comparison: 04/06/2011  Findings: Cardiothoracic index 51%, compatible with mild cardiomegaly.  Thoracic spondylosis noted.  The lungs appear clear. No pleural effusion observed.  IMPRESSION:  1.  Mild cardiomegaly. 2.  Thoracic spondylosis.   Original Report Authenticated By: Gaylyn Rong, M.D.    Review of Systems: General: no anorexia, weight gain or weight loss Cardiac: no orthopnea, PND,  or syncope Respiratory: no cough, sputum production or hemoptysis GI: no nausea, abdominal pain, emesis, diarrhea or constipation Integument: no significant lesions Neurologic: No muscle weakness or paralysis; no speech disturbance All other systems reviewed and are negative  Physical Exam: Blood pressure 199/106, pulse 60, temperature 98.1 F (36.7 C), temperature source Oral, resp. rate 16, SpO2 99.00%.  General-Well-developed; no acute distress; moderately overweight HEENT-[place]/AT; PERRL; EOM intact; conjunctiva and lids nl Neck-No  JVD; no carotid bruits Endocrine-No thyromegaly Lungs-Clear lung fields; resonant percussion; normal I-to-E ratio; decreased breath sounds at the bases Cardiovascular- normal PMI; normal S1 and S2; S4 present Abdomen-BS normal; soft and  non-tender without masses or organomegaly Musculoskeletal-No deformities, cyanosis or clubbing; pain with movement of the left leg Neurologic-Nl cranial nerves; symmetric strength and tone Skin- Warm, no significant lesions Extremities-Nl distal pulses; no edema  Assessment/Plan:  Patient has had recurrent chest pain over many years without documented significant coronary disease. As has been the case in the past, she has additional complaints, currently including severe headache, left leg pain and difficulty walking. Although her EKGs are somewhat more abnormal than they have been in the past, I doubt that this represents an acute coronary syndrome. It appears to me that she will need to be admitted for evaluation of her other problems, but this would best be done on the hospitalist service.  Cardiac markers and EKG will be repeated in the morning.  Mitral valve prolapse is likely an erroneous diagnosis and does not account for the problems she has had over the years. She experiences episodes of palpitations and chest pain, and arrhythmia is a consideration. She has never undergone event recording, which might be helpful after discharge.  She has dyspnea and a moderately elevated d-dimer.  D-dimer was at the upper limit of normal when she was last hospitalized in 03/2011. CT of the chest was normal at that time.  If she continues to experience dyspnea, it may be necessary to repeat that examination.  She's been without her medication for most of the day, and blood pressure has increased. Her usual antihypertensives have been ordered.  Lipid profile was checked one year ago and was markedly suboptimal. This study will be repeated, but she likely requires  pharmacologic therapy.  Headache could reflect the increase in blood pressure, but is likely a separate problem.  Left leg pain is likely musculoskeletal. 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  If discomfort cannot be medically controlled, orthopedic or neurosurgical consultation may be helpful.  The physicians of [facility] appreciate the request for consultation and we'll be happy to follow this woman in hospital and as an outpatient if necessary.  [place], MD 04/05/2012, 8:39 PM

## 2012-04-05 NOTE — H&P (Signed)
[facility]                                                                                    Patient Demographics  Brenda Flores, is a 67 y.o. female  CSN: [id number]  MRN: [id number]  DOB - 24-Apr-1945  Admit Date - 04/05/2012  Outpatient Primary MD for the patient is Gwynneth Aliment, MD   With History of -  Past Medical History  Diagnosis Date  . Hypertension     Echo 04/07/11:  Mild LVH, mild focal basal septal hypertrophy, EF 55-60%, grade 1 diast dysfxn, MAC, ? Liver cyst (dedicated abd u/s recommended).  [name] HLD (hyperlipidemia)   . Glucose intolerance (impaired glucose tolerance)   . DJD (degenerative joint disease)   . CAD (coronary artery disease)     non-obs CAD by cath in 7/07 (mLAD 30-40%, EF 65%)  . Liver cyst   . Chest pain     Myoview (done 04/12/11): EF 68%, no ischemia.  . Osteoporosis   . Leg swelling   . Rectal carcinoma       Past Surgical History  Procedure Date  . Back surgery   . Abdominal hysterectomy 1978  . Heel spur surgery 2003    left    in for   Chief Complaint  Patient presents with  . Chest Pain     HPI  Brenda Flores  is a 67 y.o. female,  with past medical history significant for hypertension and obstructive carotid disease, presenting with chest pain episode so that has been caring for the last 3 days on and off. The chest pain was described as retrosternal radiating to her left jaw staying for 3 minutes worse with exercise described as squeezing, associated with nausea and vomiting. Patient had some cold sweats that she attributed to postmenopausal symptoms. The chest pain was also  associated with shortness of breath. No abdominal pain or diarrhea. 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 Left leg Doppler in the emergency room was negative.     Review of Systems    In addition to the HPI above,  No Fever-chills, No  Headache, No changes with Vision or hearing, No problems swallowing food or Liquids, No Abdominal pain, No Nausea or Vommitting, Bowel movements are regular, No Blood in stool or Urine, No dysuria, No new skin rashes or bruises, No new weakness, tingling, numbness in any extremity, No recent weight gain or loss, No polyuria, polydypsia or polyphagia, No significant Mental Stressors.  A full 10 point Review of Systems was done, except as stated above, all other Review of Systems were negative.   Social History History  Substance Use Topics  . Smoking status: Never Smoker   . Smokeless tobacco: Never Used  . Alcohol Use: No     Family History Family History  Problem Relation Age of Onset  . Cancer Mother     breast  . Cancer Father     colon     Prior to Admission medications   Medication Sig Start Date End Date Taking? Authorizing Provider  amLODipine (NORVASC)  5 MG tablet Take 5 mg by mouth daily.   Yes Historical Provider, MD  esomeprazole (NEXIUM) 40 MG capsule Take 40 mg by mouth daily before breakfast.   Yes Historical Provider, MD  olmesartan-hydrochlorothiazide (BENICAR HCT) 20-12.5 MG per tablet Take 1 tablet by mouth daily.   Yes Historical Provider, MD    Allergies  Allergen Reactions  . Darvocet (Propoxyphene-Acetaminophen) Nausea And Vomiting  . Zofran Itching    Physical Exam  Vitals  Blood pressure 154/110, pulse 63, temperature 98.1 F (36.7 C), temperature source Oral, resp. rate 19, SpO2 95.00%.   1. General  African American female, in moderate distress due to nausea. Chest pain-free   2. Normal affect and insight, Not Suicidal or Homicidal, Awake Alert, Oriented X 3.  3. No F.N deficits, ALL C.Nerves Intact, Strength 5/5 all 4 extremities, Sensation intact all 4 extremities, Plantars down going.  4. Ears and Eyes appear Normal, Conjunctivae clear, PERRLA. Moist Oral Mucosa.  5. Supple Neck, No JVD, No cervical lymphadenopathy  appriciated, No Carotid Bruits.  6. Symmetrical Chest wall movement, Good air movement bilaterally, CTAB.  7. RRR, No Gallops, Rubs or Murmurs, No Parasternal Heave.  8. Positive Bowel Sounds, Abdomen Soft, Non tender, No organomegaly appriciated,No rebound -guarding or rigidity.  9.  No Cyanosis, Normal Skin Turgor, No Skin Rash or Bruise.  10. Good muscle tone,  joints appear normal , no effusions, Normal ROM   11. No Palpable Lymph Nodes in Neck or Axillae  12. Extremities: Trace edema at the Shins, with arthritic changes in both legs.    Data Review  CBC  Lab 04/05/12 1529 04/05/12 1508  WBC -- 6.3  HGB 13.3 12.7  HCT 39.0 38.2  PLT -- 204  MCV -- 82.7  MCH -- 27.5  MCHC -- 33.2  RDW -- 16.1*  LYMPHSABS -- --  MONOABS -- --  EOSABS -- --  BASOSABS -- --  BANDABS -- --   ------------------------------------------------------------------------------------------------------------------  Chemistries   Lab 04/05/12 1529  NA 143  K 3.8  CL 105  CO2 --  GLUCOSE 84  BUN 13  CREATININE 0.80  CALCIUM --  MG --  AST --  ALT --  ALKPHOS --  BILITOT --    Coagulation profile No results found for this basename: INR:5,PROTIME:5 in the last 168 hours -------------------------------------------------------------------------------------------------------------------  [facility] 04/05/12 1508  DDIMER 1.49*   -------------------------------------------------------------------------------------------------------------------       Imaging results:   Dg Chest 2 View  04/05/2012  *RADIOLOGY REPORT*  Clinical Data: Chest pain.  Left leg pain and swelling. Hypertension and coronary artery disease.  CHEST - 2 VIEW  Comparison: 04/06/2011  Findings: Cardiothoracic index 51%, compatible with mild cardiomegaly.  Thoracic spondylosis noted.  The lungs appear clear. No pleural effusion observed.  IMPRESSION:  1.  Mild cardiomegaly. 2.  Thoracic spondylosis.   Original  Report Authenticated By: Gaylyn Rong, M.D.     My personal review of EKG: LVH with normal sinus rhythm at 55 T wave inversions in the inferior leads noted    Assessment & Plan  1. chest pain. EKG shows some T-wave inversions in inferior leads, LVH as well. Patient has history of  coronary artery disease, nonobstructive status post cardiac cath in 2007. 2. Left leg pain; seems to be musculoskeletal. Patient reports a history of disc disease diagnosed in 1980, can be worked up as outpatient. Left leg Doppler was negative for DVT. 3. Nausea patient allergic to Zofran, started on Phenergan 4. Shortness of  breath with mild elevation in d-dimer , CT of the chest in the past was negative, can be repeated if shortness of breath continues 5. Hypertension, uncontrolled patient did not take her medications.  Plan Place in observation and telemetry Rule out MI Protonix IV Phenergan IV Cardiology/ on consult When necessary hydralazine IV   DVT Prophylaxis   AM Labs Ordered, also please review Full Orders  Family Communication: Admission, patients condition and plan of care including tests being ordered have been discussed with the patient  who indicate understanding and agree with the plan and Code Status.  Code Status full  Disposition Plan: Observe him telemetry then discharged home in stable  Time spent in minutes : 43 minutes  Condition fair

## 2012-04-05 NOTE — ED Notes (Signed)
Attempted to call report. Floor RN unable to accept report.  

## 2012-04-05 NOTE — ED Notes (Signed)
Placed  On nasal  o2  At  2 l /  Min  And  Cardiac  monitor

## 2012-04-05 NOTE — ED Notes (Signed)
Patient is resting comfortably. 

## 2012-04-05 NOTE — ED Notes (Signed)
Pt  Did  Not take  bp  meds  today

## 2012-04-05 NOTE — ED Notes (Signed)
Repeated EKG.

## 2012-04-05 NOTE — Progress Notes (Signed)
VASCULAR LAB PRELIMINARY  PRELIMINARY  PRELIMINARY  PRELIMINARY  Left lower extremity venous duplex completed.    Preliminary report:  Left:  No evidence of DVT, superficial thrombosis, or Baker's cyst.  Brenda Flores, RVS 04/05/2012, 5:19 PM

## 2012-04-05 NOTE — ED Notes (Signed)
Iv   Ns   tko  22    Angio  l  Hand  1  Att      Site  patent

## 2012-04-05 NOTE — ED Provider Notes (Signed)
I assumed care of patient in signout to f/u on DVT study and repeat troponin Repeat trop negative DVT study negative Pt tells me she has had intermittent exertional CP with SOB over past 3 days. This is new for her.  She reports chest pressure with exertion She does have h/o CAD (nonobstructive disease) per previous cath She has been seen by [place] cardiology I have ordered ASA She refuses NTG but will take her home BP meds I have spoken to dr Dietrich Pates with cardiology to see patient   Date: 04/05/2012  Rate: 55  Rhythm: normal sinus rhythm  QRS Axis: normal  Intervals: normal  ST/T Wave abnormalities: ST depressions inferiorly  Conduction Disutrbances:none  Narrative Interpretation: no STEMI     Joya Gaskins, MD 04/05/12 2021

## 2012-04-05 NOTE — ED Provider Notes (Signed)
History     CSN: [id number]  Arrival date & time 04/05/12  1336   First MD Initiated Contact with Patient 04/05/12 1421      Chief Complaint  Patient presents with  . Chest Pain    (Consider location/radiation/quality/duration/timing/severity/associated sxs/prior treatment) HPI Complains of anterior chest pain onset yesterday lasting 5 minutes at a time. Nonradiating. Nonexertional. She also complains of left leg pain onset yesterday which has been constant worse with movement and burning and numbness in nature worse at anterior thigh and she noticed a bruise on her left anterior shin yesterday. Patient also complains of diffuse headache onset yesterday. Patient seen at urgent care center prior to coming here sent here for further evaluation no treatment prior to coming here. No shortness of breath nausea or sweatiness. Past Medical History  Diagnosis Date  . Hypertension     Echo 04/07/11:  Mild LVH, mild focal basal septal hypertrophy, EF 55-60%, grade 1 diast dysfxn, MAC, ? Liver cyst (dedicated abd u/s recommended).  [name] HLD (hyperlipidemia)   . Glucose intolerance (impaired glucose tolerance)   . DJD (degenerative joint disease)   . CAD (coronary artery disease)     non-obs CAD by cath in 7/07 (mLAD 30-40%, EF 65%)  . Liver cyst   . Chest pain     Myoview (done 04/12/11): EF 68%, no ischemia.  . Osteoporosis   . Leg swelling   . Rectal carcinoma     Past Surgical History  Procedure Date  . Back surgery   . Abdominal hysterectomy 1978  . Heel spur surgery 2003    left    Family History  Problem Relation Age of Onset  . Cancer Mother     breast  . Cancer Father     colon    History  Substance Use Topics  . Smoking status: Never Smoker   . Smokeless tobacco: Never Used  . Alcohol Use: No    OB History    Grav Para Term Preterm Abortions TAB SAB Ect Mult Living                  Review of Systems  Cardiovascular: Positive for chest pain.  Musculoskeletal:  Positive for myalgias.       Left leg and thigh pain  Neurological: Positive for headaches.    Allergies  Darvocet and Zofran  Home Medications   Current Outpatient Rx  Name  Route  Sig  Dispense  Refill  . AMLODIPINE BESYLATE 5 MG PO TABS   Oral   Take 5 mg by mouth daily.         [name] ESOMEPRAZOLE MAGNESIUM 40 MG PO CPDR   Oral   Take 40 mg by mouth daily before breakfast.         . OLMESARTAN MEDOXOMIL-HCTZ 20-12.5 MG PO TABS   Oral   Take 1 tablet by mouth daily.           BP 155/86  Pulse 61  Temp 98.2 F (36.8 C)  Resp 16  SpO2 97%  Physical Exam  Nursing note and vitals reviewed. Constitutional: She appears well-developed and well-nourished.  HENT:  Head: Normocephalic and atraumatic.  Eyes: Conjunctivae normal are normal. Pupils are equal, round, and reactive to light.  Neck: Neck supple. No tracheal deviation present. No thyromegaly present.  Cardiovascular: Normal rate and regular rhythm.   No murmur heard. Pulmonary/Chest: Effort normal and breath sounds normal.  Abdominal: Soft. Bowel sounds are normal. She exhibits no distension.  There is no tenderness.       Obese  Musculoskeletal: Normal range of motion. She exhibits no edema and no tenderness.       All 4 extremities without redness swelling or tenderness DP pulses 2+ bilateral femoral pulses 2+ bilaterally both lower extremities with hair present. No skin changes suggestive of chronic ischemia  Neurological: She is alert. Coordination normal.  Skin: Skin is warm and dry. No rash noted.       Approximately 4 cm ecchymosis at left shin otherwise no bruises or rash  Psychiatric: She has a normal mood and affect.    ED Course  Procedures (including critical care time)  Labs Reviewed - No data to display No results found.   Date: 04/05/2012  1419  Rate: 65  Rhythm: normal sinus rhythm  QRS Axis: normal  Intervals: normal  ST/T Wave abnormalities: nonspecific T wave changes  Conduction  Disutrbances:none  Narrative Interpretation: Left ventricular hypertrophy  Old EKG Reviewed: Unchanged from initial EKG performed at 12:10 PM today, interpreted by me  No diagnosis found.  Chest xray reviewed by me  MDM  Symptoms atypical for acute coronary syndrome. Pain is nonexertional normal EKG, and negative Myoview one year ago. Venous Doppler of left leg is pending Patient signed out to Dr,. Wickline 505 pm Diagnosis #1 atypical chest pain #2 pain in left lower extremity #3 nonspecific headache        Doug Sou, MD 04/05/12 1713

## 2012-04-05 NOTE — ED Notes (Addendum)
Pt sent from urgent care for CP that started last night. Pt hx of CP, HTN. Pt c/o left leg pain and HA. Did not take norvasc this AM

## 2012-04-06 DIAGNOSIS — I5030 Unspecified diastolic (congestive) heart failure: Secondary | ICD-10-CM

## 2012-04-06 DIAGNOSIS — IMO0002 Reserved for concepts with insufficient information to code with codable children: Secondary | ICD-10-CM

## 2012-04-06 LAB — CBC
HCT: 37.2 % (ref 36.0–46.0)
Hemoglobin: 12 g/dL (ref 12.0–15.0)
MCH: 26.7 pg (ref 26.0–34.0)
MCHC: 32.3 g/dL (ref 30.0–36.0)
RBC: 4.49 MIL/uL (ref 3.87–5.11)
RDW: 16.3 % — ABNORMAL HIGH (ref 11.5–15.5)

## 2012-04-06 LAB — LIPID PANEL
LDL Cholesterol: 122 mg/dL — ABNORMAL HIGH (ref 0–99)
Total CHOL/HDL Ratio: 4 RATIO
Triglycerides: 102 mg/dL (ref <150)
VLDL: 20 mg/dL (ref 0–40)

## 2012-04-06 MED ORDER — ASPIRIN 81 MG PO TBEC: 81.0000 mg | DELAYED_RELEASE_TABLET | Freq: Every day | ORAL | Status: DC

## 2012-04-06 MED ORDER — TRAMADOL HCL 50 MG PO TABS: 50.0000 mg | ORAL_TABLET | Freq: Four times a day (QID) | ORAL | Status: DC | PRN

## 2012-04-06 MED ORDER — GABAPENTIN 300 MG PO CAPS: 300.0000 mg | ORAL_CAPSULE | Freq: Three times a day (TID) | ORAL | Status: DC

## 2012-04-06 MED ORDER — AMLODIPINE BESYLATE 10 MG PO TABS: 10.0000 mg | ORAL_TABLET | Freq: Every day | ORAL | Status: DC

## 2012-04-06 MED ORDER — ESOMEPRAZOLE MAGNESIUM 40 MG PO CPDR: 40.0000 mg | DELAYED_RELEASE_CAPSULE | Freq: Two times a day (BID) | ORAL | Status: DC

## 2012-04-06 NOTE — Progress Notes (Signed)
Patient ID: Brenda Flores, female   DOB: 1945-10-21, 67 y.o.   MRN: [id number] Subjective:  No chest pain. Still with HA  Objective:  Vital Signs in the last 24 hours: Temp:  [97.3 F (36.3 C)-98.6 F (37 C)] 98.2 F (36.8 C) (01/17 0500) Pulse Rate:  [59-75] 75  (01/17 0500) Resp:  [13-19] 18  (01/17 0500) BP: (135-199)/(73-110) 135/73 mmHg (01/17 0500) SpO2:  [94 %-99 %] 94 % (01/17 0500) Weight:  [187 lb 4.8 oz (84.959 kg)] 187 lb 4.8 oz (84.959 kg) (01/16 2325)  Intake/Output from previous day: 01/16 0701 - 01/17 0700 In: 303 [P.O.:300; I.V.:3] Out: 300 [Urine:300] Intake/Output from this shift:    Physical Exam: Well appearing NAD HEENT: Unremarkable Neck:  No JVD, no thyromegally Lungs:  Clear with no wheezes, rales, or rhonchi HEART:  Regular rate rhythm, no murmurs, no rubs, no clicks Abd:  Flat, positive bowel sounds, no organomegally, no rebound, no guarding Ext:  2 plus pulses, no edema, no cyanosis, no clubbing Skin:  No rashes no nodules Neuro:  CN II through XII intact, motor grossly intact  Lab Results:  Basename 04/06/12 0610 04/05/12 1529 04/05/12 1508  WBC 5.4 -- 6.3  HGB 12.0 13.3 --  PLT 195 -- 204    Basename 04/05/12 1529  NA 143  K 3.8  CL 105  CO2 --  GLUCOSE 84  BUN 13  CREATININE 0.80    Basename 04/06/12 0610 04/05/12 2346  TROPONINI <0.30 <0.30   Hepatic Function Panel No results found for this basename: PROT,ALBUMIN,AST,ALT,ALKPHOS,BILITOT,BILIDIR,IBILI in the last 72 hours  Basename 04/06/12 0610  CHOL 190   No results found for this basename: PROTIME in the last 72 hours  Imaging: Dg Chest 2 View  04/05/2012  *RADIOLOGY REPORT*  Clinical Data: Chest pain.  Left leg pain and swelling. Hypertension and coronary artery disease.  CHEST - 2 VIEW  Comparison: 04/06/2011  Findings: Cardiothoracic index 51%, compatible with mild cardiomegaly.  Thoracic spondylosis noted.  The lungs appear clear. No pleural effusion  observed.  IMPRESSION:  1.  Mild cardiomegaly. 2.  Thoracic spondylosis.   Original Report Authenticated By: Gaylyn Rong, M.D.     Cardiac Studies: Tele - nsr Assessment/Plan:  1. Non-cardiac chest pain - note enzymes are normal. Ok from cardiac perspective to discharge. I suspect her chest pain due to a GI source, possibly esophageal spasm.  2. HTN - consider increasing amlodipine.  LOS: 1 day    Caidence Kaseman,M.D. 04/06/2012, 8:29 AM

## 2012-04-06 NOTE — Progress Notes (Signed)
Pt c/o CP mid left chest tender to touch. VS done & charted. EKG showed t wave abnormality but no different than previous two ekgs. Pt refused Nitro. Pt given 2 mg morphine. MD on call notified no new orders given at this time will continue to monitor the pt. Sanda Linger

## 2012-04-06 NOTE — Progress Notes (Signed)
Physician Discharge Summary  CRISTEL RAIL EAV:[id number] DOB: 08-05-1945 DOA: 04/05/2012  PCP: Gwynneth Aliment, MD  Admit date: 04/05/2012 Discharge date: 04/06/2012  Time spent: >30 minutes  Recommendations for Outpatient Follow-up:  1. Need follow up of BP and medication adjustment 2. Also reevaluate back pain and radiculopathy and if still present consider MRI and ortho/neurosurgery consult.  Discharge Diagnoses:  1-Non cardica chest pain 2-GERD 3-HTN 4-HLD 5-radiculopathy 6-diastolic heart failure 7-DJD  Discharge Condition: stable and improved; will be discharge home and will follow with PCP in 10 days.  Diet recommendation: heart healthy  Filed Weights   04/05/12 2325  Weight: 84.959 kg (187 lb 4.8 oz)    History of present illness:  67 y.o. female, with past medical history significant for hypertension and obstructive carotid disease, presenting with chest pain episode so that has been caring for the last 3 days on and off. The chest pain was described as retrosternal/epigastric, burning in nature, radiating to her left jaw staying for 3 minutes worse with exercise described as squeezing, associated with nausea and vomiting; also with mild SOB. Patient had some cold sweats that she attributed to postmenopausal symptoms. No abdominal pain or diarrhea. 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 Left leg Doppler in the emergency room was negative.    Hospital Course:  1-Non-cardiac chest pain: most likely due to GERD, especially with recent increase use of NSAID's for back/leg pain. Will increase PPI to BID, adjust antihypertensive drugs for better control of BP and recommend heart healthy diet. CE'z negative X 3; no further workup per cardiology recommendations.  2-Left leg pain/radiculopathy: will start patient on neurontin TID and PRN tramadol. Patient will follow with PCP for further  evaluation and treatment. Might need lumbar spine MRI in case pain failed to improved and evaluation from orthopedic or neurosurgery service. No red flags on exam.  3-GERD: continue PPI; changed to BID for better control  4-HTN: amlodipine increased to 10mg  daily; advised to follow low sodium diet  5-HLD: not taking any statins. Patient following low fat diet; improved in comparison to lipid panel a year ago. Outpatient follow up.  6-Chronic diastolic grade 1 HF: compensated. Continue low sodium diet.  Rest of medical problems remains stable; patient will continue current medication regimen and will follow with PCP for further evaluation and treatment.  Procedures: See below for xray reports. LLE doppler negative for DVT, SVT or baker cyst  Consultations:  cardiology  Discharge Exam: Filed Vitals:   04/05/12 2245 04/05/12 2325 04/06/12 0310 04/06/12 0500  BP: 173/95 171/83 164/91 135/73  Pulse: 60 68 60 75  Temp:  97.3 F (36.3 C)  98.2 F (36.8 C)  TempSrc:  Oral  Oral  Resp: 13 16 18 18   Height:  5' 5.5" (1.664 m)    Weight:  84.959 kg (187 lb 4.8 oz)    SpO2: 95% 97% 98% 94%    General: NAD, denies CP or SOB. afebrile Cardiovascular: soft SEM, no rubs, no gallops S1 and S2 Respiratory: CTA bilaterally Abdomen: soft, NT, ND positive BS Extremities: trace edema; no erythema or joint swelling Neuro: mild decrease on MS LLE due to pain; otherwise no focal abnormalities seen.  Discharge Instructions  Discharge Orders    Future Orders Please Complete By Expires   Diet - low sodium heart healthy      Discharge instructions      Comments:   Take  medications as prescribed Arrange follow up with PCP in 2 weeks Keep yourself hydrated       Medication List     As of 04/06/2012  9:59 AM    TAKE these medications         amLODipine 5 MG tablet   Commonly known as: NORVASC   Take 5 mg by mouth daily.      aspirin 81 MG EC tablet   Take 1 tablet (81 mg total) by  mouth daily.      esomeprazole 40 MG capsule   Commonly known as: NEXIUM   Take 1 capsule (40 mg total) by mouth 2 (two) times daily.      gabapentin 300 MG capsule   Commonly known as: NEURONTIN   Take 1 capsule (300 mg total) by mouth 3 (three) times daily.      olmesartan-hydrochlorothiazide 20-12.5 MG per tablet   Commonly known as: BENICAR HCT   Take 1 tablet by mouth daily.      traMADol 50 MG tablet   Commonly known as: ULTRAM   Take 1 tablet (50 mg total) by mouth every 6 (six) hours as needed for pain.           Follow-up Information    Follow up with Gwynneth Aliment, MD. Schedule an appointment as soon as possible for a visit in 10 days.   Contact information:   [address] [place] [address] [phone]           The results of significant diagnostics from this hospitalization (including imaging, microbiology, ancillary and laboratory) are listed below for reference.    Significant Diagnostic Studies: Dg Chest 2 View  04/05/2012  *RADIOLOGY REPORT*  Clinical Data: Chest pain.  Left leg pain and swelling. Hypertension and coronary artery disease.  CHEST - 2 VIEW  Comparison: 04/06/2011  Findings: Cardiothoracic index 51%, compatible with mild cardiomegaly.  Thoracic spondylosis noted.  The lungs appear clear. No pleural effusion observed.  IMPRESSION:  1.  Mild cardiomegaly. 2.  Thoracic spondylosis.   Original Report Authenticated By: Gaylyn Rong, M.D.    Labs: Basic Metabolic Panel:  Lab 04/05/12 6578  NA 143  K 3.8  CL 105  CO2 --  GLUCOSE 84  BUN 13  CREATININE 0.80  CALCIUM --  MG --  PHOS --   CBC:  Lab 04/06/12 0610 04/05/12 1529 04/05/12 1508  WBC 5.4 -- 6.3  NEUTROABS -- -- --  HGB 12.0 13.3 12.7  HCT 37.2 39.0 38.2  MCV 82.9 -- 82.7  PLT 195 -- 204   Cardiac Enzymes:  Lab 04/06/12 0610 04/05/12 2346  CKTOTAL -- --  CKMB -- --  CKMBINDEX -- --  TROPONINI <0.30 <0.30   CBG:  Lab 04/05/12 1238  GLUCAP  83     Signed:  Calix Heinbaugh  [facility] 04/06/2012, 9:59 AM

## 2012-04-06 NOTE — Plan of Care (Signed)
Problem: Phase I Progression Outcomes Goal: Aspirin unless contraindicated Outcome: Completed/Met Date Met:  04/06/12 Pt was given 325mg  of ASA in ED

## 2012-04-07 NOTE — Discharge Summary (Signed)
Brenda Flores FAO:[id number] DOB: January 30, 1946 DOA: 04/05/2012  PCP: Gwynneth Aliment, MD  Admit date: 04/05/2012  Discharge date: 04/06/2012  Time spent: >30 minutes  Recommendations for Outpatient Follow-up:  1. Need follow up of BP and medication adjustment 2. Also reevaluate back pain and radiculopathy and if still present consider MRI and ortho/neurosurgery consult. Discharge Diagnoses:  1-Non cardica chest pain  2-GERD  3-HTN  4-HLD  5-radiculopathy  6-diastolic heart failure  7-DJD  Discharge Condition: stable and improved; will be discharge home and will follow with PCP in 10 days.  Diet recommendation: heart healthy  Filed Weights    04/05/12 2325   Weight:  84.959 kg (187 lb 4.8 oz)    History of present illness:  67 y.o. female, with past medical history significant for hypertension and obstructive carotid disease, presenting with chest pain episode so that has been caring for the last 3 days on and off. The chest pain was described as retrosternal/epigastric, burning in nature, radiating to her left jaw staying for 3 minutes worse with exercise described as squeezing, associated with nausea and vomiting; also with mild SOB. Patient had some cold sweats that she attributed to postmenopausal symptoms. No abdominal pain or diarrhea. 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 Left leg Doppler in the emergency room was negative.  Hospital Course:  1-Non-cardiac chest pain: most likely due to GERD, especially with recent increase use of NSAID's for back/leg pain. Will increase PPI to BID, adjust antihypertensive drugs for better control of BP and recommend heart healthy diet. CE'z negative X 3; no further workup per cardiology recommendations.  2-Left leg pain/radiculopathy: will start patient on neurontin TID and PRN tramadol. Patient will follow with PCP for further evaluation and treatment. Might need  lumbar spine MRI in case pain failed to improved and evaluation from orthopedic or neurosurgery service. No red flags on exam.  3-GERD: continue PPI; changed to BID for better control  4-HTN: amlodipine increased to 10mg  daily; advised to follow low sodium diet  5-HLD: not taking any statins. Patient following low fat diet; improved in comparison to lipid panel a year ago. Outpatient follow up.  6-Chronic diastolic grade 1 HF: compensated. Continue low sodium diet.  Rest of medical problems remains stable; patient will continue current medication regimen and will follow with PCP for further evaluation and treatment.  Procedures:  See below for xray reports.  LLE doppler negative for DVT, SVT or baker cyst  Consultations:  cardiology Discharge Exam:  Filed Vitals:    04/05/12 2245  04/05/12 2325  04/06/12 0310  04/06/12 0500   BP:  173/95  171/83  164/91  135/73   Pulse:  60  68  60  75   Temp:   97.3 F (36.3 C)   98.2 F (36.8 C)   TempSrc:   Oral   Oral   Resp:  13  16  18  18    Height:   5' 5.5" (1.664 m)     Weight:   84.959 kg (187 lb 4.8 oz)     SpO2:  95%  97%  98%  94%    General: NAD, denies CP or SOB. afebrile  Cardiovascular: soft SEM, no rubs, no gallops S1 and S2  Respiratory: CTA bilaterally  Abdomen: soft, NT, ND positive BS  Extremities: trace edema; no erythema or joint swelling  Neuro: mild decrease on MS LLE due to pain;  otherwise no focal abnormalities seen.  Discharge Instructions  Discharge Orders    Future Orders  Please Complete By  Expires    Diet - low sodium heart healthy      Discharge instructions      Comments:    Take medications as prescribed  Arrange follow up with PCP in 2 weeks  Keep yourself hydrated        Medication List      As of 04/06/2012 9:59 AM     TAKE these medications        amLODipine 5 MG tablet     Commonly known as: NORVASC     Take 5 mg by mouth daily.     aspirin 81 MG EC tablet     Take 1 tablet (81 mg total) by  mouth daily.     esomeprazole 40 MG capsule     Commonly known as: NEXIUM     Take 1 capsule (40 mg total) by mouth 2 (two) times daily.     gabapentin 300 MG capsule     Commonly known as: NEURONTIN     Take 1 capsule (300 mg total) by mouth 3 (three) times daily.     olmesartan-hydrochlorothiazide 20-12.5 MG per tablet     Commonly known as: BENICAR HCT     Take 1 tablet by mouth daily.     traMADol 50 MG tablet     Commonly known as: ULTRAM     Take 1 tablet (50 mg total) by mouth every 6 (six) hours as needed for pain.          Follow-up Information    Follow up with Gwynneth Aliment, MD. Schedule an appointment as soon as possible for a visit in 10 days.    Contact information:    [address]  [place] [address]  [phone]         The results of significant diagnostics from this hospitalization (including imaging, microbiology, ancillary and laboratory) are listed below for reference.   Significant Diagnostic Studies:  Dg Chest 2 View  04/05/2012 *RADIOLOGY REPORT* Clinical Data: Chest pain. Left leg pain and swelling. Hypertension and coronary artery disease. CHEST - 2 VIEW Comparison: 04/06/2011 Findings: Cardiothoracic index 51%, compatible with mild cardiomegaly. Thoracic spondylosis noted. The lungs appear clear. No pleural effusion observed. IMPRESSION: 1. Mild cardiomegaly. 2. Thoracic spondylosis. Original Report Authenticated By: Gaylyn Rong, M.D.  Labs:  Basic Metabolic Panel:   Lab  04/05/12 1529   NA  143   K  3.8   CL  105   CO2  --   GLUCOSE  84   BUN  13   CREATININE  0.80   CALCIUM  --   MG  --   PHOS  --    CBC:   Lab  04/06/12 0610  04/05/12 1529  04/05/12 1508   WBC  5.4  --  6.3   NEUTROABS  --  --  --   HGB  12.0  13.3  12.7   HCT  37.2  39.0  38.2   MCV  82.9  --  82.7   PLT  195  --  204    Cardiac Enzymes:   Lab  04/06/12 0610  04/05/12 2346   CKTOTAL  --  --   CKMB  --  --   CKMBINDEX  --  --     TROPONINI  <0.30  <0.30    CBG:   Lab  04/05/12 1238   GLUCAP  83    Signed:  Mickell Birdwell  [facility]  04/06/2012, 9:59 AM

## 2012-04-26 ENCOUNTER — Other Ambulatory Visit: Payer: Self-pay | Admitting: *Deleted

## 2012-04-26 DIAGNOSIS — M79609 Pain in unspecified limb: Secondary | ICD-10-CM

## 2012-06-11 ENCOUNTER — Encounter: Payer: Self-pay | Admitting: Vascular Surgery

## 2012-06-12 ENCOUNTER — Encounter: Payer: Medicare Other | Admitting: Vascular Surgery

## 2012-07-04 ENCOUNTER — Encounter: Payer: Self-pay | Admitting: Vascular Surgery

## 2012-07-05 ENCOUNTER — Encounter: Payer: Medicare Other | Admitting: Vascular Surgery

## 2012-08-15 ENCOUNTER — Encounter: Payer: Self-pay | Admitting: Vascular Surgery

## 2012-08-16 ENCOUNTER — Encounter: Payer: Medicare Other | Admitting: Vascular Surgery

## 2013-06-05 ENCOUNTER — Emergency Department (HOSPITAL_COMMUNITY): Payer: Medicare Other

## 2013-06-05 ENCOUNTER — Emergency Department (HOSPITAL_COMMUNITY)
Admission: EM | Admit: 2013-06-05 | Discharge: 2013-06-05 | Disposition: A | Discharge status: Home / Self Care | Payer: Medicare Other | Attending: Emergency Medicine | Admitting: Emergency Medicine

## 2013-06-05 ENCOUNTER — Encounter (HOSPITAL_COMMUNITY): Payer: Self-pay | Admitting: Emergency Medicine

## 2013-06-05 DIAGNOSIS — N1330 Unspecified hydronephrosis: Secondary | ICD-10-CM

## 2013-06-05 DIAGNOSIS — R112 Nausea with vomiting, unspecified: Secondary | ICD-10-CM | POA: Insufficient documentation

## 2013-06-05 DIAGNOSIS — Z85048 Personal history of other malignant neoplasm of rectum, rectosigmoid junction, and anus: Secondary | ICD-10-CM | POA: Insufficient documentation

## 2013-06-05 DIAGNOSIS — I10 Essential (primary) hypertension: Secondary | ICD-10-CM | POA: Insufficient documentation

## 2013-06-05 DIAGNOSIS — Z8639 Personal history of other endocrine, nutritional and metabolic disease: Secondary | ICD-10-CM | POA: Insufficient documentation

## 2013-06-05 DIAGNOSIS — Z862 Personal history of diseases of the blood and blood-forming organs and certain disorders involving the immune mechanism: Secondary | ICD-10-CM | POA: Insufficient documentation

## 2013-06-05 DIAGNOSIS — Z79899 Other long term (current) drug therapy: Secondary | ICD-10-CM | POA: Insufficient documentation

## 2013-06-05 DIAGNOSIS — R61 Generalized hyperhidrosis: Secondary | ICD-10-CM | POA: Insufficient documentation

## 2013-06-05 DIAGNOSIS — Z8719 Personal history of other diseases of the digestive system: Secondary | ICD-10-CM | POA: Insufficient documentation

## 2013-06-05 DIAGNOSIS — Z8739 Personal history of other diseases of the musculoskeletal system and connective tissue: Secondary | ICD-10-CM | POA: Insufficient documentation

## 2013-06-05 DIAGNOSIS — N201 Calculus of ureter: Secondary | ICD-10-CM

## 2013-06-05 DIAGNOSIS — I2510 Atherosclerotic heart disease of native coronary artery without angina pectoris: Secondary | ICD-10-CM | POA: Insufficient documentation

## 2013-06-05 LAB — COMPREHENSIVE METABOLIC PANEL
ALT: 20 U/L (ref 0–35)
AST: 24 U/L (ref 0–37)
Albumin: 3.9 g/dL (ref 3.5–5.2)
Alkaline Phosphatase: 88 U/L (ref 39–117)
BUN: 18 mg/dL (ref 6–23)
CALCIUM: 9.6 mg/dL (ref 8.4–10.5)
CO2: 26 mEq/L (ref 19–32)
Chloride: 103 mEq/L (ref 96–112)
Creatinine, Ser: 0.86 mg/dL (ref 0.50–1.10)
GFR calc Af Amer: 80 mL/min — ABNORMAL LOW (ref >90)
GFR calc non Af Amer: 69 mL/min — ABNORMAL LOW (ref >90)
Glucose, Bld: 122 mg/dL — ABNORMAL HIGH (ref 70–99)
Potassium: 4.3 mEq/L (ref 3.7–5.3)
SODIUM: 144 meq/L (ref 137–147)
TOTAL PROTEIN: 8 g/dL (ref 6.0–8.3)
Total Bilirubin: 0.4 mg/dL (ref 0.3–1.2)

## 2013-06-05 LAB — URINE MICROSCOPIC-ADD ON

## 2013-06-05 LAB — CBC WITH DIFFERENTIAL/PLATELET
BASOS ABS: 0 10*3/uL (ref 0.0–0.1)
Basophils Relative: 0 % (ref 0–1)
EOS ABS: 0.1 10*3/uL (ref 0.0–0.7)
EOS PCT: 1 % (ref 0–5)
HCT: 40.5 % (ref 36.0–46.0)
Hemoglobin: 13.2 g/dL (ref 12.0–15.0)
LYMPHS PCT: 23 % (ref 12–46)
Lymphs Abs: 1.3 10*3/uL (ref 0.7–4.0)
MCH: 27.8 pg (ref 26.0–34.0)
MCHC: 32.6 g/dL (ref 30.0–36.0)
MCV: 85.3 fL (ref 78.0–100.0)
Monocytes Absolute: 0.4 10*3/uL (ref 0.1–1.0)
Monocytes Relative: 7 % (ref 3–12)
Neutro Abs: 4 10*3/uL (ref 1.7–7.7)
Neutrophils Relative %: 69 % (ref 43–77)
PLATELETS: 134 10*3/uL — AB (ref 150–400)
RBC: 4.75 MIL/uL (ref 3.87–5.11)
RDW: 15.4 % (ref 11.5–15.5)
WBC: 5.8 10*3/uL (ref 4.0–10.5)

## 2013-06-05 LAB — URINALYSIS, ROUTINE W REFLEX MICROSCOPIC
Bilirubin Urine: NEGATIVE
GLUCOSE, UA: NEGATIVE mg/dL
Ketones, ur: NEGATIVE mg/dL
Leukocytes, UA: NEGATIVE
Nitrite: NEGATIVE
Protein, ur: NEGATIVE mg/dL
SPECIFIC GRAVITY, URINE: 1.018 (ref 1.005–1.030)
UROBILINOGEN UA: 0.2 mg/dL (ref 0.0–1.0)
pH: 6.5 (ref 5.0–8.0)

## 2013-06-05 LAB — LIPASE, BLOOD: Lipase: 21 U/L (ref 11–59)

## 2013-06-05 MED ORDER — MORPHINE SULFATE 4 MG/ML IJ SOLN
4.0000 mg | Freq: Once | INTRAMUSCULAR | Status: AC
  Administered 2013-06-05: 4 mg via INTRAVENOUS
  Filled 2013-06-05: qty 1

## 2013-06-05 MED ORDER — SODIUM CHLORIDE 0.9 % IV BOLUS (SEPSIS)
1000.0000 mL | Freq: Once | INTRAVENOUS | Status: AC
  Administered 2013-06-05: 1000 mL via INTRAVENOUS

## 2013-06-05 MED ORDER — PROMETHAZINE HCL 25 MG/ML IJ SOLN
12.5000 mg | Freq: Once | INTRAMUSCULAR | Status: AC
  Administered 2013-06-05: 12.5 mg via INTRAVENOUS
  Filled 2013-06-05: qty 1

## 2013-06-05 MED ORDER — TAMSULOSIN HCL 0.4 MG PO CAPS: 0.4000 mg | ORAL_CAPSULE | Freq: Every day | ORAL | Status: DC

## 2013-06-05 MED ORDER — OXYCODONE-ACETAMINOPHEN 5-325 MG PO TABS: 1.0000 | ORAL_TABLET | ORAL | Status: DC | PRN

## 2013-06-05 MED ORDER — PROMETHAZINE HCL 25 MG PO TABS: 25.0000 mg | ORAL_TABLET | Freq: Four times a day (QID) | ORAL | Status: DC | PRN

## 2013-06-05 MED ORDER — PROMETHAZINE HCL 25 MG/ML IJ SOLN
12.5000 mg | Freq: Once | INTRAMUSCULAR | Status: DC
  Filled 2013-06-05: qty 1

## 2013-06-05 MED ORDER — KETOROLAC TROMETHAMINE 15 MG/ML IJ SOLN
15.0000 mg | Freq: Once | INTRAMUSCULAR | Status: AC
  Administered 2013-06-05: 15 mg via INTRAVENOUS
  Filled 2013-06-05: qty 1

## 2013-06-05 NOTE — ED Notes (Signed)
MD at bedside. 

## 2013-06-05 NOTE — ED Notes (Signed)
Pt c/o abd pain n/v since 1000 this morning. States pain started on right side wraps around back and stomach. Vomited about 4 times.

## 2013-06-05 NOTE — Discharge Instructions (Signed)
Kidney Stones  Kidney stones (urolithiasis) are deposits that form inside your kidneys. The intense pain is caused by the stone moving through the urinary tract. When the stone moves, the ureter goes into spasm around the stone. The stone is usually passed in the urine.   CAUSES   · A disorder that makes certain neck glands produce too much parathyroid hormone (primary hyperparathyroidism).  · A buildup of uric acid crystals, similar to gout in your joints.  · Narrowing (stricture) of the ureter.  · A kidney obstruction present at birth (congenital obstruction).  · Previous surgery on the kidney or ureters.  · Numerous kidney infections.  SYMPTOMS   · Feeling sick to your stomach (nauseous).  · Throwing up (vomiting).  · Blood in the urine (hematuria).  · Pain that usually spreads (radiates) to the groin.  · Frequency or urgency of urination.  DIAGNOSIS   · Taking a history and physical exam.  · Blood or urine tests.  · CT scan.  · Occasionally, an examination of the inside of the urinary bladder (cystoscopy) is performed.  TREATMENT   · Observation.  · Increasing your fluid intake.  · Extracorporeal shock wave lithotripsy This is a noninvasive procedure that uses shock waves to break up kidney stones.  · Surgery may be needed if you have severe pain or persistent obstruction. There are various surgical procedures. Most of the procedures are performed with the use of small instruments. Only small incisions are needed to accommodate these instruments, so recovery time is minimized.  The size, location, and chemical composition are all important variables that will determine the proper choice of action for you. Talk to your health care provider to better understand your situation so that you will minimize the risk of injury to yourself and your kidney.   HOME CARE INSTRUCTIONS   · Drink enough water and fluids to keep your urine clear or pale yellow. This will help you to pass the stone or stone fragments.  · Strain  all urine through the provided strainer. Keep all particulate matter and stones for your health care provider to see. The stone causing the pain may be as small as a grain of salt. It is very important to use the strainer each and every time you pass your urine. The collection of your stone will allow your health care provider to analyze it and verify that a stone has actually passed. The stone analysis will often identify what you can do to reduce the incidence of recurrences.  · Only take over-the-counter or prescription medicines for pain, discomfort, or fever as directed by your health care provider.  · Make a follow-up appointment with your health care provider as directed.  · Get follow-up X-rays if required. The absence of pain does not always mean that the stone has passed. It may have only stopped moving. If the urine remains completely obstructed, it can cause loss of kidney function or even complete destruction of the kidney. It is your responsibility to make sure X-rays and follow-ups are completed. Ultrasounds of the kidney can show blockages and the status of the kidney. Ultrasounds are not associated with any radiation and can be performed easily in a matter of minutes.  SEEK MEDICAL CARE IF:  · You experience pain that is progressive and unresponsive to any pain medicine you have been prescribed.  SEEK IMMEDIATE MEDICAL CARE IF:   · Pain cannot be controlled with the prescribed medicine.  · You have a fever   or shaking chills.  · The severity or intensity of pain increases over 18 hours and is not relieved by pain medicine.  · You develop a new onset of abdominal pain.  · You feel faint or pass out.  · You are unable to urinate.  MAKE SURE YOU:   · Understand these instructions.  · Will watch your condition.  · Will get help right away if you are not doing well or get worse.  Document Released: 03/07/2005 Document Revised: 11/07/2012 Document Reviewed: 08/08/2012  [employer]® Patient Information ©2014  [employer].

## 2013-06-05 NOTE — ED Provider Notes (Signed)
CSN: [id number]     Arrival date & time 06/05/13  1238 History   First MD Initiated Contact with Patient 06/05/13 1308     Chief Complaint  Patient presents with  . Abdominal Pain  . Nausea     (Consider location/radiation/quality/duration/timing/severity/associated sxs/prior Treatment) HPI Comments: 68 year old female presents with 3-4 hours of right flank pain radiating to right groin. It started acutely and she had sweating, nausea, and vomiting. She's never had pain like this before. She's never had a history of kidney stone. Denies any hematuria. Denies any midline back pain. No leg weakness. The pain is a severe 10 out of 10.   Past Medical History  Diagnosis Date  . Hypertension     Echo 04/07/11:  Mild LVH, mild focal basal septal hypertrophy, EF 55-60%, grade 1 diast dysfxn, MAC, ? Liver cyst (dedicated abd u/s recommended).  [name] HLD (hyperlipidemia)   . Glucose intolerance (impaired glucose tolerance)   . DJD (degenerative joint disease)   . CAD (coronary artery disease)     non-obs CAD by cath in 7/07 (mLAD 30-40%, EF 65%)  . Liver cyst   . Chest pain     Myoview (done 04/12/11): EF 68%, no ischemia.  . Osteoporosis   . Leg swelling   . Rectal carcinoma     pt states no hx cancer--S.Carlota Raspberry CT   Past Surgical History  Procedure Laterality Date  . Back surgery    . Abdominal hysterectomy  1978  . Heel spur surgery  2003    left   Family History  Problem Relation Age of Onset  . Cancer Mother     breast  . Cancer Father     colon   History  Substance Use Topics  . Smoking status: Never Smoker   . Smokeless tobacco: Never Used  . Alcohol Use: No   OB History   Grav Para Term Preterm Abortions TAB SAB Ect Mult Living                 Review of Systems  Constitutional: Positive for diaphoresis. Negative for fever.  Gastrointestinal: Positive for nausea, vomiting and abdominal pain.  Genitourinary: Positive for flank pain. Negative for dysuria and hematuria.   Musculoskeletal: Positive for back pain.  Neurological: Negative for weakness and numbness.  All other systems reviewed and are negative.      Allergies  Darvocet and Zofran  Home Medications   Current Outpatient Rx  Name  Route  Sig  Dispense  Refill  . amLODipine (NORVASC) 10 MG tablet   Oral   Take 1 tablet (10 mg total) by mouth daily.   30 tablet   1   . esomeprazole (NEXIUM) 40 MG capsule   Oral   Take 1 capsule (40 mg total) by mouth 2 (two) times daily.   60 capsule   1   . sitaGLIPtin (JANUVIA) 100 MG tablet   Oral   Take 100 mg by mouth daily.         . traMADol (ULTRAM) 50 MG tablet   Oral   Take 1 tablet (50 mg total) by mouth every 6 (six) hours as needed for pain.   45 tablet   0    BP 182/92  Pulse 76  Temp(Src) 98 F (36.7 C) (Oral)  Resp 18  SpO2 99% Physical Exam  Nursing note and vitals reviewed. Constitutional: She is oriented to person, place, and time. She appears well-developed and well-nourished.  HENT:  Head: Normocephalic and  atraumatic.  Right Ear: External ear normal.  Left Ear: External ear normal.  Nose: Nose normal.  Eyes: Right eye exhibits no discharge. Left eye exhibits no discharge.  Cardiovascular: Normal rate, regular rhythm and normal heart sounds.   Pulmonary/Chest: Effort normal and breath sounds normal.  Abdominal: Soft. There is tenderness in the left upper quadrant and left lower quadrant. CVA tenderness: mild right.  Neurological: She is alert and oriented to person, place, and time.  Skin: Skin is warm and dry.    ED Course  Procedures (including critical care time) Labs Review Labs Reviewed  CBC WITH DIFFERENTIAL - Abnormal; Notable for the following:    Platelets 134 (*)    All other components within normal limits  COMPREHENSIVE METABOLIC PANEL - Abnormal; Notable for the following:    Glucose, Bld 122 (*)    GFR calc non Af Amer 69 (*)    GFR calc Af Amer 80 (*)    All other components within  normal limits  URINALYSIS, ROUTINE W REFLEX MICROSCOPIC - Abnormal; Notable for the following:    Hgb urine dipstick LARGE (*)    All other components within normal limits  URINE MICROSCOPIC-ADD ON - Abnormal; Notable for the following:    Casts HYALINE CASTS (*)    All other components within normal limits  LIPASE, BLOOD   Imaging Review Ct Abdomen Pelvis Wo Contrast  06/05/2013   CLINICAL DATA:  Lower abdominal pain, right flank pain.  EXAM: CT ABDOMEN AND PELVIS WITHOUT CONTRAST  TECHNIQUE: Multidetector CT imaging of the abdomen and pelvis was performed following the standard protocol without intravenous contrast.  COMPARISON:  CT ABD/PELVIS W CM dated 02/22/2011  FINDINGS: Lung bases are clear. No effusions. Heart is normal size.  Large multiple low-density lesions are noted throughout the liver. These appear stable in configuration and size compatible with numerous large hepatic cysts. Ill-defined hypodensity noted in the inferior hepatic lobe corresponds to the hemangioma seen on prior contrast study. This does not appear significantly changed in size.  There is right hydronephrosis due to a 2 mm stone at the right ureterovesical junction. 2-3 mm stone in the lower pole of the left kidney which is nonobstructing. No left ureteral stone or left hydronephrosis. Calcified phleboliths noted in the pelvis and in the right gonadal vein at the pelvic brim, stable.  Spleen, pancreas, adrenals and gallbladder have an unremarkable unenhanced appearance. Appendix is visualized and is normal.  Stomach, large and small bowel grossly unremarkable. No free fluid, free air or adenopathy. Urinary bladder is decompressed but grossly unremarkable.  No acute bony abnormality.  IMPRESSION: 2 mm right UVJ stone with mild right hydronephrosis.  Left lower pole nephrolithiasis.  Stable hepatic cysts and inferior hepatic hemangioma.   Electronically Signed   By: Rolm Baptise M.D.   On: 06/05/2013 14:07     EKG  Interpretation None      MDM   Final diagnoses:  Ureterolithiasis  Hydronephrosis, right    Patient history is consistent with a right-sided kidney stone. 2 mm stone seen on CT scan. She does have some left sided abdominal tenderness but no obvious explanation CT for this. Her pain was controlled with IV narcotics and IV antibiotics. No vomiting in the ED after Phenergan. The patient feels significantly improved, we'll treat her stone with pain control and, nausea control, and fluids. We'll refer her to urology as an outpatient. There is no sign of fever or UTI. Patient understands return precautions.  Ephraim Hamburger, MD 06/05/13 [phone]

## 2013-06-05 NOTE — ED Notes (Signed)
Patient transported to CT 

## 2013-06-10 ENCOUNTER — Encounter (HOSPITAL_BASED_OUTPATIENT_CLINIC_OR_DEPARTMENT_OTHER): Payer: Self-pay | Admitting: *Deleted

## 2013-06-10 ENCOUNTER — Other Ambulatory Visit: Payer: Self-pay | Admitting: Urology

## 2013-06-10 NOTE — Progress Notes (Signed)
NPO AFTER MN. ARRIVE AT 0600.  NEEDS ISTAT , EKG, AND KUB. WILL TAKE NORVASC AND NEXIUM AM DOS W/ SIPS OF WATER. IF NEEDED MAY TAKE ONE TYPE PAIN RX AND NAUSEA RX AM DOS.

## 2013-06-13 ENCOUNTER — Encounter (HOSPITAL_BASED_OUTPATIENT_CLINIC_OR_DEPARTMENT_OTHER): Payer: Self-pay | Admitting: Anesthesiology

## 2013-06-13 NOTE — Anesthesia Preprocedure Evaluation (Addendum)
Anesthesia Evaluation  Patient identified by MRN, date of birth, ID band Patient awake    Reviewed: Allergy & Precautions, H&P , NPO status , Patient's Chart, lab work & pertinent test results  Airway Mallampati: II TM Distance: >3 FB Neck ROM: Full    Dental no notable dental hx.    Pulmonary neg pulmonary ROS,  breath sounds clear to auscultation  Pulmonary exam normal       Cardiovascular hypertension, Pt. on medications + CAD Rhythm:Regular Rate:Normal  ECHO EF 55-60% (2013)   Neuro/Psych  Headaches,  Neuromuscular disease negative psych ROS   GI/Hepatic Neg liver ROS, GERD-  Medicated,  Endo/Other  diabetes, Type 2, Oral Hypoglycemic Agents  Renal/GU Renal disease  negative genitourinary   Musculoskeletal negative musculoskeletal ROS (+)   Abdominal   Peds negative pediatric ROS (+)  Hematology negative hematology ROS (+)   Anesthesia Other Findings   Reproductive/Obstetrics negative OB ROS                         Anesthesia Physical Anesthesia Plan  ASA: III  Anesthesia Plan: General   Post-op Pain Management:    Induction: Intravenous  Airway Management Planned: LMA  Additional Equipment:   Intra-op Plan:   Post-operative Plan: Extubation in OR  Informed Consent: I have reviewed the patients History and Physical, chart, labs and discussed the procedure including the risks, benefits and alternatives for the proposed anesthesia with the patient or authorized representative who has indicated his/her understanding and acceptance.   Dental advisory given  Plan Discussed with: CRNA  Anesthesia Plan Comments: (9/10 flank pain. Fentanyl preop. Ordered.)       Anesthesia Quick Evaluation

## 2013-06-14 ENCOUNTER — Encounter (HOSPITAL_BASED_OUTPATIENT_CLINIC_OR_DEPARTMENT_OTHER): Payer: Medicare Other | Admitting: Anesthesiology

## 2013-06-14 ENCOUNTER — Encounter (HOSPITAL_BASED_OUTPATIENT_CLINIC_OR_DEPARTMENT_OTHER)
Admission: RE | Disposition: A | Discharge status: Home / Self Care | Payer: Self-pay | Source: Ambulatory Visit | Attending: Urology

## 2013-06-14 ENCOUNTER — Encounter (HOSPITAL_BASED_OUTPATIENT_CLINIC_OR_DEPARTMENT_OTHER): Payer: Self-pay

## 2013-06-14 ENCOUNTER — Ambulatory Visit (HOSPITAL_BASED_OUTPATIENT_CLINIC_OR_DEPARTMENT_OTHER): Payer: Medicare Other | Admitting: Anesthesiology

## 2013-06-14 ENCOUNTER — Other Ambulatory Visit: Payer: Self-pay

## 2013-06-14 ENCOUNTER — Ambulatory Visit (HOSPITAL_BASED_OUTPATIENT_CLINIC_OR_DEPARTMENT_OTHER)
Admission: RE | Admit: 2013-06-14 | Discharge: 2013-06-14 | Disposition: A | Discharge status: Home / Self Care | Payer: Medicare Other | Source: Ambulatory Visit | Attending: Urology | Admitting: Urology

## 2013-06-14 ENCOUNTER — Ambulatory Visit (HOSPITAL_COMMUNITY): Payer: Medicare Other

## 2013-06-14 DIAGNOSIS — N35919 Unspecified urethral stricture, male, unspecified site: Secondary | ICD-10-CM | POA: Insufficient documentation

## 2013-06-14 DIAGNOSIS — E7800 Pure hypercholesterolemia, unspecified: Secondary | ICD-10-CM | POA: Insufficient documentation

## 2013-06-14 DIAGNOSIS — Z8673 Personal history of transient ischemic attack (TIA), and cerebral infarction without residual deficits: Secondary | ICD-10-CM | POA: Insufficient documentation

## 2013-06-14 DIAGNOSIS — Z888 Allergy status to other drugs, medicaments and biological substances status: Secondary | ICD-10-CM | POA: Insufficient documentation

## 2013-06-14 DIAGNOSIS — N200 Calculus of kidney: Secondary | ICD-10-CM | POA: Insufficient documentation

## 2013-06-14 DIAGNOSIS — Z90710 Acquired absence of both cervix and uterus: Secondary | ICD-10-CM | POA: Insufficient documentation

## 2013-06-14 DIAGNOSIS — Z79899 Other long term (current) drug therapy: Secondary | ICD-10-CM | POA: Insufficient documentation

## 2013-06-14 DIAGNOSIS — K219 Gastro-esophageal reflux disease without esophagitis: Secondary | ICD-10-CM | POA: Insufficient documentation

## 2013-06-14 DIAGNOSIS — Z885 Allergy status to narcotic agent status: Secondary | ICD-10-CM | POA: Insufficient documentation

## 2013-06-14 DIAGNOSIS — I10 Essential (primary) hypertension: Secondary | ICD-10-CM | POA: Insufficient documentation

## 2013-06-14 DIAGNOSIS — N201 Calculus of ureter: Secondary | ICD-10-CM | POA: Insufficient documentation

## 2013-06-14 DIAGNOSIS — E119 Type 2 diabetes mellitus without complications: Secondary | ICD-10-CM | POA: Insufficient documentation

## 2013-06-14 HISTORY — DX: Radiculopathy, lumbar region: M54.16

## 2013-06-14 HISTORY — PX: CYSTOSCOPY WITH URETEROSCOPY: SHX5123

## 2013-06-14 HISTORY — DX: Personal history of other diseases of the circulatory system: Z86.79

## 2013-06-14 HISTORY — DX: Other intervertebral disc degeneration, lumbar region without mention of lumbar back pain or lower extremity pain: M51.369

## 2013-06-14 HISTORY — DX: Cystic disease of liver: Q44.6

## 2013-06-14 HISTORY — DX: Gastro-esophageal reflux disease without esophagitis: K21.9

## 2013-06-14 HISTORY — DX: Hyperlipidemia, unspecified: E78.5

## 2013-06-14 HISTORY — DX: Calculus of kidney: N20.0

## 2013-06-14 HISTORY — DX: Calculus of ureter: N20.1

## 2013-06-14 HISTORY — DX: Type 2 diabetes mellitus without complications: E11.9

## 2013-06-14 HISTORY — DX: Other intervertebral disc degeneration, lumbar region: M51.36

## 2013-06-14 HISTORY — DX: Personal history of other diseases of the digestive system: Z87.19

## 2013-06-14 LAB — POCT I-STAT 4, (NA,K, GLUC, HGB,HCT)
Glucose, Bld: 100 mg/dL — ABNORMAL HIGH (ref 70–99)
HCT: 45 % (ref 36.0–46.0)
Hemoglobin: 15.3 g/dL — ABNORMAL HIGH (ref 12.0–15.0)
Potassium: 3.8 mEq/L (ref 3.7–5.3)
Sodium: 143 mEq/L (ref 137–147)

## 2013-06-14 LAB — GLUCOSE, CAPILLARY: Glucose-Capillary: 121 mg/dL — ABNORMAL HIGH (ref 70–99)

## 2013-06-14 SURGERY — CYSTOSCOPY WITH URETEROSCOPY
Anesthesia: General | Site: Ureter | Laterality: Right

## 2013-06-14 MED ORDER — IOHEXOL 350 MG/ML SOLN
INTRAVENOUS | Status: DC | PRN
  Administered 2013-06-14: 4 mL

## 2013-06-14 MED ORDER — FENTANYL CITRATE 0.05 MG/ML IJ SOLN
INTRAMUSCULAR | Status: AC
  Filled 2013-06-14: qty 4

## 2013-06-14 MED ORDER — FENTANYL CITRATE 0.05 MG/ML IJ SOLN
25.0000 ug | INTRAMUSCULAR | Status: DC | PRN
  Administered 2013-06-14: 25 ug via INTRAVENOUS
  Filled 2013-06-14: qty 1

## 2013-06-14 MED ORDER — PROPOFOL 10 MG/ML IV BOLUS
INTRAVENOUS | Status: DC | PRN
  Administered 2013-06-14: 180 mg via INTRAVENOUS

## 2013-06-14 MED ORDER — METOCLOPRAMIDE HCL 5 MG/ML IJ SOLN
INTRAMUSCULAR | Status: DC | PRN
  Administered 2013-06-14: 10 mg via INTRAVENOUS

## 2013-06-14 MED ORDER — FENTANYL CITRATE 0.05 MG/ML IJ SOLN
25.0000 ug | INTRAMUSCULAR | Status: DC | PRN
  Administered 2013-06-14 (×3): 25 ug via INTRAVENOUS
  Administered 2013-06-14: 07:00:00 via INTRAVENOUS
  Filled 2013-06-14: qty 0.5

## 2013-06-14 MED ORDER — CIPROFLOXACIN IN D5W 200 MG/100ML IV SOLN
200.0000 mg | INTRAVENOUS | Status: AC
  Administered 2013-06-14: 200 mg via INTRAVENOUS
  Filled 2013-06-14: qty 100

## 2013-06-14 MED ORDER — MIDAZOLAM HCL 2 MG/2ML IJ SOLN
INTRAMUSCULAR | Status: AC
  Filled 2013-06-14: qty 2

## 2013-06-14 MED ORDER — LACTATED RINGERS IV SOLN
INTRAVENOUS | Status: DC
  Administered 2013-06-14: 07:00:00 via INTRAVENOUS
  Filled 2013-06-14: qty 1000

## 2013-06-14 MED ORDER — BELLADONNA ALKALOIDS-OPIUM 16.2-60 MG RE SUPP
RECTAL | Status: DC | PRN
  Administered 2013-06-14: 1 via RECTAL

## 2013-06-14 MED ORDER — LIDOCAINE HCL (CARDIAC) 20 MG/ML IV SOLN
INTRAVENOUS | Status: DC | PRN
  Administered 2013-06-14: 80 mg via INTRAVENOUS

## 2013-06-14 MED ORDER — ACETAMINOPHEN 10 MG/ML IV SOLN
INTRAVENOUS | Status: DC | PRN
  Administered 2013-06-14: 1000 mg via INTRAVENOUS

## 2013-06-14 MED ORDER — PROMETHAZINE HCL 25 MG/ML IJ SOLN
6.2500 mg | INTRAMUSCULAR | Status: DC | PRN
  Filled 2013-06-14: qty 1

## 2013-06-14 MED ORDER — DEXAMETHASONE SODIUM PHOSPHATE 4 MG/ML IJ SOLN
INTRAMUSCULAR | Status: DC | PRN
  Administered 2013-06-14: 10 mg via INTRAVENOUS

## 2013-06-14 MED ORDER — FENTANYL CITRATE 0.05 MG/ML IJ SOLN
INTRAMUSCULAR | Status: AC
  Filled 2013-06-14: qty 2

## 2013-06-14 MED ORDER — MIDAZOLAM HCL 5 MG/5ML IJ SOLN
INTRAMUSCULAR | Status: DC | PRN
  Administered 2013-06-14: 2 mg via INTRAVENOUS

## 2013-06-14 MED ORDER — PHENAZOPYRIDINE HCL 200 MG PO TABS: 200.0000 mg | ORAL_TABLET | Freq: Three times a day (TID) | ORAL | Status: DC | PRN

## 2013-06-14 MED ORDER — BELLADONNA ALKALOIDS-OPIUM 16.2-60 MG RE SUPP
RECTAL | Status: AC
  Filled 2013-06-14: qty 1

## 2013-06-14 MED ORDER — KETOROLAC TROMETHAMINE 30 MG/ML IJ SOLN
INTRAMUSCULAR | Status: DC | PRN
  Administered 2013-06-14: 15 mg via INTRAVENOUS

## 2013-06-14 MED ORDER — FENTANYL CITRATE 0.05 MG/ML IJ SOLN
INTRAMUSCULAR | Status: DC | PRN
  Administered 2013-06-14: 50 ug via INTRAVENOUS

## 2013-06-14 MED ORDER — SODIUM CHLORIDE 0.9 % IR SOLN
Status: DC | PRN
  Administered 2013-06-14: 4000 mL

## 2013-06-14 SURGICAL SUPPLY — 35 items
ADAPTER CATH URET PLST 4-6FR (CATHETERS) IMPLANT
BAG DRAIN URO-CYSTO SKYTR STRL (DRAIN) ×2 IMPLANT
BASKET LASER NITINOL 1.9FR (BASKET) IMPLANT
BASKET STNLS GEMINI 4WIRE 3FR (BASKET) IMPLANT
BASKET ZERO TIP NITINOL 2.4FR (BASKET) IMPLANT
CANISTER SUCT LVC 12 LTR MEDI- (MISCELLANEOUS) ×2 IMPLANT
CATH INTERMIT  6FR 70CM (CATHETERS) IMPLANT
CATH URET 5FR 28IN CONE TIP (BALLOONS)
CATH URET 5FR 70CM CONE TIP (BALLOONS) IMPLANT
CLOTH BEACON ORANGE TIMEOUT ST (SAFETY) ×2 IMPLANT
DRAPE CAMERA CLOSED 9X96 (DRAPES) ×2 IMPLANT
ELECT REM PT RETURN 9FT ADLT (ELECTROSURGICAL)
ELECTRODE REM PT RTRN 9FT ADLT (ELECTROSURGICAL) IMPLANT
FIBER LASER FLEXIVA 200 (UROLOGICAL SUPPLIES) IMPLANT
FIBER LASER FLEXIVA 365 (UROLOGICAL SUPPLIES) IMPLANT
GLOVE BIO SURGEON STRL SZ8 (GLOVE) ×2 IMPLANT
GLOVE BIOGEL M STER SZ 6 (GLOVE) ×2 IMPLANT
GLOVE BIOGEL PI IND STRL 6.5 (GLOVE) ×2 IMPLANT
GLOVE BIOGEL PI INDICATOR 6.5 (GLOVE) ×2
GOWN PREVENTION PLUS LG XLONG (DISPOSABLE) IMPLANT
GOWN STRL REIN XL XLG (GOWN DISPOSABLE) IMPLANT
GOWN STRL REUS W/TWL LRG LVL3 (GOWN DISPOSABLE) ×2 IMPLANT
GOWN STRL REUS W/TWL XL LVL3 (GOWN DISPOSABLE) ×2 IMPLANT
GUIDEWIRE 0.038 PTFE COATED (WIRE) ×2 IMPLANT
GUIDEWIRE ANG ZIPWIRE 038X150 (WIRE) IMPLANT
GUIDEWIRE STR DUAL SENSOR (WIRE) ×2 IMPLANT
IV NS IRRIG 3000ML ARTHROMATIC (IV SOLUTION) ×2 IMPLANT
KIT BALLIN UROMAX 15FX10 (LABEL) IMPLANT
KIT BALLN UROMAX 15FX4 (MISCELLANEOUS) IMPLANT
KIT BALLN UROMAX 26 75X4 (MISCELLANEOUS)
PACK CYSTOSCOPY (CUSTOM PROCEDURE TRAY) ×2 IMPLANT
SET HIGH PRES BAL DIL (LABEL)
SHEATH ACCESS URETERAL 38CM (SHEATH) IMPLANT
SHEATH ACCESS URETERAL 54CM (SHEATH) IMPLANT
SYRINGE IRR TOOMEY STRL 70CC (SYRINGE) IMPLANT

## 2013-06-14 NOTE — Discharge Instructions (Signed)
Cystoscopy patient instructions ° °Following a cystoscopy, a catheter (a flexible rubber tube) is sometimes left in place to empty the bladder. This may cause some discomfort or a feeling that you need to urinate. Your doctor determines the period of time that the catheter will be left in place. °You may have bloody urine for two to three days (Call your doctor if the amount of bleeding increases or does not subside). ° °You may pass blood clots in your urine, especially if you had a biopsy. It is not unusual to pass small blood clots and have some bloody urine a couple of weeks after your cystoscopy. Again, call your doctor if the bleeding does not subside. °You may have: °Dysuria (painful urination) °Frequency (urinating often) °Urgency (strong desire to urinate) ° °These symptoms are common especially if medicine is instilled into the bladder or a ureteral stent is placed. Avoiding alcohol and caffeine, such as coffee, tea, and chocolate, may help relieve these symptoms. Drink plenty of water, unless otherwise instructed. Your doctor may also prescribe an antibiotic or other medicine to reduce these symptoms. ° °Cystoscopy results are available soon after the procedure; biopsy results usually take two to four days. Your doctor will discuss the results of your exam with you. Before you go home, you will be given specific instructions for follow-up care. °Special Instructions: ° °1  If you are going home with a catheter in place do not take a tub bath until removed by your doctor.  °2  You may resume your normal activities.  °3  Do not drive or operate machinery if you are taking narcotic pain medicine.  °4  Be sure to keep all follow-up appointments with your doctor.  ° °5 Call Your Doctor If: °            You have severe pain ° You are unable to urinate ° You have a fever over 101 ° You have severe bleeding °        ° ° °Post Anesthesia Home Care Instructions ° °Activity: °Get plenty of rest for the remainder of  the day. A responsible adult should stay with you for 24 hours following the procedure.  °For the next 24 hours, DO NOT: °-Drive a car °-Operate machinery °-Drink alcoholic beverages °-Take any medication unless instructed by your physician °-Make any legal decisions or sign important papers. ° °Meals: °Start with liquid foods such as gelatin or soup. Progress to regular foods as tolerated. Avoid greasy, spicy, heavy foods. If nausea and/or vomiting occur, drink only clear liquids until the nausea and/or vomiting subsides. Call your physician if vomiting continues. ° °Special Instructions/Symptoms: °Your throat may feel dry or sore from the anesthesia or the breathing tube placed in your throat during surgery. If this causes discomfort, gargle with warm salt water. The discomfort should disappear within 24 hours. ° ° ° °

## 2013-06-14 NOTE — Anesthesia Postprocedure Evaluation (Signed)
  Anesthesia Post-op Note  Patient: Brenda Flores  Procedure(s) Performed: Procedure(s) (LRB): RIGHT  URETEROSCOPY,ureteral and urethral dilitation (Right)  Patient Location: PACU  Anesthesia Type: General  Level of Consciousness: awake and alert   Airway and Oxygen Therapy: Patient Spontanous Breathing  Post-op Pain: mild  Post-op Assessment: Post-op Vital signs reviewed, Patient's Cardiovascular Status Stable, Respiratory Function Stable, Patent Airway and No signs of Nausea or vomiting  Last Vitals:  Filed Vitals:   06/14/13 0850  BP:   Pulse: 59  Temp:   Resp: 13    Post-op Vital Signs: stable   Complications: No apparent anesthesia complications. Headache in PACU. Fentanyl given. Blood pressure OK

## 2013-06-14 NOTE — H&P (Signed)
Brenda Flores is a 68 year old female with a ureteral calculus.  History of Present Illness Nephrolithiasis: CT scan 06/05/13 revealed a 3 mm stone located in the lower pole left kidney that was nonobstructing. At that time a 2 mm stone was located at the right ureterovesical junction.    Interval history: She reports that she developed severe right flank pain that was associated with diaphoresis, nausea and vomiting. The pain radiated into the lower abdomen. It has now persisted but is constant and is much less severe. She does however find it difficult to sleep at night because of the discomfort in the pain medication she was prescribed in the emergency room has not been effective at controlling her pain completely. She was not started on medical expulsive therapy. She has no prior history of kidney stones and there is no history of stones in her family. She's not seen any hematuria but has significant urgency, frequency and superpubic discomfort. Her discomfort is not relieved by positional change but is worsened by increasing fluid intake.   Past Medical History Problems  1. History of cardiac disorder (V12.50) 2. History of diabetes mellitus (V12.29) 3. History of gastroesophageal reflux (GERD) (V12.79) 4. History of hypercholesterolemia (V12.29) 5. History of hypertension (V12.59) 6. History of Stroke or transient ischemic attack (TIA) diagnosed during current admission  (435.9)  Surgical History Problems  1. History of Foot Surgery 2. History of Hysterectomy  Current Meds 1. Hydrocodone-Acetaminophen TABS;  Therapy: (Recorded:20Mar2015) to Recorded 2. Norvasc TABS;  Therapy: (Recorded:20Mar2015) to Recorded 3. Phenergan TABS;  Therapy: (Recorded:20Mar2015) to Recorded  Allergies Medication  1. Zofran TABS 2. Darvon CAPS 3. Darvocet-N 100 TABS  Family History Problems  1. Family history of Deceased : Mother 2. Family history of colon cancer (V16.0) : Father 3. Family  history of malignant neoplasm of breast (V16.3) : Mother  Social History Problems    Denied: History of Alcohol use   Caffeine use (V49.89)   Former smoker [place])   Number of children   3 kids   Student   Widower  Review of Systems Genitourinary, constitutional, skin, eye, otolaryngeal, hematologic/lymphatic, cardiovascular, pulmonary, endocrine, musculoskeletal, gastrointestinal, neurological and psychiatric system(s) were reviewed and pertinent findings if present are noted.  Genitourinary: urinary frequency, urinary urgency and nocturia, but no hematuria.  Gastrointestinal: nausea, vomiting and heartburn.  Constitutional: night sweats and feeling tired (fatigue).  Eyes: blurred vision.  Cardiovascular: chest pain and leg swelling.  Respiratory: shortness of breath.  Endocrine: polydipsia.  Musculoskeletal: back pain and joint pain.  Neurological: dizziness and headache.    Vitals Vital Signs Height: 5 ft 5.5 in Weight: 180 lb  BMI Calculated: 29.5 BSA Calculated: 1.9 Blood Pressure: 157 / 87 Heart Rate: 66  Physical Exam Constitutional: Well nourished and well developed . No acute distress.  ENT:. The ears and nose are normal in appearance.  Neck: The appearance of the neck is normal and no neck mass is present.  Pulmonary: No respiratory distress and normal respiratory rhythm and effort.  Cardiovascular: Heart rate and rhythm are normal . No peripheral edema.  Abdomen: The abdomen is soft and nontender. No masses are palpated. moderate right CVA tenderness. No hernias are palpable. No hepatosplenomegaly noted.  Lymphatics: The femoral and inguinal nodes are not enlarged or tender.  Skin: Normal skin turgor, no visible rash and no visible skin lesions.  Neuro/Psych:. Mood and affect are appropriate.    Results/Data Urine  COLOR YELLOW  APPEARANCE CLEAR  SPECIFIC GRAVITY 1.025  pH 5.5  GLUCOSE NEG mg/dL BILIRUBIN SMALL  KETONE NEG mg/dL BLOOD TRACE   PROTEIN TRACE mg/dL UROBILINOGEN 0.2 mg/dL NITRITE NEG  LEUKOCYTE ESTERASE NEG  SQUAMOUS EPITHELIAL/HPF FEW  WBC 3-6 WBC/hpf RBC 0-2 RBC/hpf BACTERIA RARE  CRYSTALS NONE SEEN  CASTS Hyaline casts noted  Other MUCUS NOTED   Old records or history reviewed: ER notes as above.  The following clinical lab reports were reviewed:  Her urine had 3-6 wbc's/hpf but no significant red cells were seen.  The following radiology reports were reviewed: CT scan as above.    Assessment  She still has right CVAT and blood in the urine. She has not seen her stone passed. I therefore have recommended we begin medical expulsive therapy as the probability of her stone passing spontaneously is exceedingly high. We also discussed decreasing her fluid intake when she was having increasing pain and that I would give her stronger pain medication. She was having a lot of irritative symptoms and I therefore will address this with an anticholinergic and I planned to see her back in follow-up to make sure she's passed her stone.  Because of continued intractable pain she had elected to proceed with ureteroscopic extraction of her stone.  I discussed the procedure with her in detail including its risks and complications, the probability of success, the outpatient nature of the procedure and anticipated postoperative course.  Her questions were answered and she has elected to proceed.  Plan   1. Prescription for Dilaudid. 2.  She called back to schedule surgery due to intractable pain.

## 2013-06-14 NOTE — Transfer of Care (Signed)
Immediate Anesthesia Transfer of Care Note  Patient: Brenda Flores  Procedure(s) Performed: Procedure(s): RIGHT  URETEROSCOPY, (Right)  Patient Location: PACU  Anesthesia Type:General  Level of Consciousness: awake and oriented  Airway & Oxygen Therapy: Patient Spontanous Breathing and Patient connected to nasal cannula oxygen  Post-op Assessment: Report given to PACU RN  Post vital signs: Reviewed and stable  Complications: No apparent anesthesia complications

## 2013-06-14 NOTE — Anesthesia Procedure Notes (Signed)
Procedure Name: LMA Insertion Date/Time: 06/14/2013 7:31 AM Performed by: Bethena Roys T Pre-anesthesia Checklist: Patient identified, Emergency Drugs available, Suction available and Patient being monitored Patient Re-evaluated:Patient Re-evaluated prior to inductionOxygen Delivery Method: Circle System Utilized Preoxygenation: Pre-oxygenation with 100% oxygen Intubation Type: IV induction Ventilation: Mask ventilation without difficulty LMA: LMA with gastric port inserted LMA Size: 5.0 Number of attempts: 1 Placement Confirmation: positive ETCO2 Tube secured with: Tape Dental Injury: Teeth and Oropharynx as per pre-operative assessment

## 2013-06-14 NOTE — Op Note (Signed)
PATIENT:  Brenda Flores  PRE-OPERATIVE DIAGNOSIS:  right Ureteral calculus  POST-OPERATIVE DIAGNOSIS: 1. Meatal stenosis 2. History of right ureteral calculus  PROCEDURE:  1. Dilation of meatal stenosis. 2. Cystoscopy with right retrograde pyelogram including interpretation. 3. Right ureteroscopy  SURGEON: Claybon Jabs, MD  INDICATION: Brenda Flores is a 68 year old female who was diagnosed with a 2 mm distal right ureteral stone by CT scan on 06/05/13. This was associated with severe right flank pain. When I saw her in the office on 06/07/13 she had no red cells seen in her urine but was still experiencing right CVAT and had not seen her stone pass. I placed on medical expulsive therapy and she elected to proceed with surgical extraction of her stone because of persistent pain. A preoperative KUB was evaluated and revealed no definite stone in the area of the UPJ. Further investigation was indicated due to the persistent pain.  ANESTHESIA:  General  EBL:  Minimal  DRAINS: None  SPECIMEN: None  DESCRIPTION OF PROCEDURE: The patient was taken to the major OR and placed on the table. General anesthesia was administered and then the patient was moved to the dorsal lithotomy position. The genitalia was sterilely prepped and draped. An official timeout was performed.  I initially attempted to pass the 75 French cystoscope but was unable secondary to meatal stenosis. I therefore used the female sounds to dilate the urethra from 18 up to 59 [place].   I was then able to pass 54 [place] cystoscope with 12 lens was passed under direct vision. No urethral lesions were identified. The bladder was then entered and fully inspected. It was noted be free of any tumors stones or inflammatory lesions. Ureteral orifices were of normal configuration and position. A 6 French open-ended ureteral catheter was then passed through the cystoscope into the ureteral orifice in order to perform a right  retrograde pyelogram.  A retrograde pyelogram was performed by injecting full-strength contrast up the right ureter under direct fluoroscopic control. It revealed no defect in the ureter and no evidence of hydronephrosis. Because no stone was seen within the bladder and the patient was complaining of pain in the preop holding area on the right-hand side I elected to proceed with ureteroscopic evaluation and therefore passed a 0.038 inch floppy-tipped guidewire through the open ended catheter and up the right ureter. This was left in place and the cystoscope as well as open-ended catheter were removed. I then passed the inner portion of a ureteral access sheath over the guidewire to gently dilate the intramural ureter. This was then removed and the guidewire was left in place and ureteroscopy was performed.   A 6 French rigid ureteroscope was then passed under direct into the bladder and into the right orifice and up the ureter.  I advanced the scope under direct vision up the ureter and noted no stone in the area of the intramural ureter or distal ureter. There was a normal-appearing ureter throughout the length visualized with normal appearing mucosa no evidence of stricture or stone. I therefore removed the ureteroscope and reinserted the cystoscope sheath to drain the bladder. The patient was then awakened and. She tolerated the procedure well no intraoperative complications.  PLAN OF CARE: Discharge to home after PACU  PATIENT DISPOSITION:  PACU - hemodynamically stable.

## 2013-06-17 ENCOUNTER — Encounter (HOSPITAL_BASED_OUTPATIENT_CLINIC_OR_DEPARTMENT_OTHER): Payer: Self-pay | Admitting: Urology

## 2013-10-08 ENCOUNTER — Encounter (HOSPITAL_COMMUNITY): Payer: Self-pay | Admitting: Emergency Medicine

## 2013-10-08 ENCOUNTER — Emergency Department (INDEPENDENT_AMBULATORY_CARE_PROVIDER_SITE_OTHER): Payer: Medicare Other

## 2013-10-08 ENCOUNTER — Emergency Department (INDEPENDENT_AMBULATORY_CARE_PROVIDER_SITE_OTHER)
Admission: EM | Admit: 2013-10-08 | Discharge: 2013-10-08 | Disposition: A | Discharge status: Home / Self Care | Payer: Medicare Other | Source: Home / Self Care

## 2013-10-08 DIAGNOSIS — J209 Acute bronchitis, unspecified: Secondary | ICD-10-CM

## 2013-10-08 MED ORDER — AZITHROMYCIN 250 MG PO TABS: ORAL_TABLET | ORAL | Status: DC

## 2013-10-08 MED ORDER — HYDROCOD POLST-CHLORPHEN POLST 10-8 MG/5ML PO LQCR: 5.0000 mL | Freq: Two times a day (BID) | ORAL | Status: DC | PRN

## 2013-10-08 MED ORDER — IPRATROPIUM BROMIDE 0.06 % NA SOLN: 2.0000 | Freq: Four times a day (QID) | NASAL | Status: DC

## 2013-10-08 NOTE — ED Notes (Signed)
Pt   Reports  Symptoms  Of   Cough  /  Congested      With  Symptoms  Not  releived  By OTC  meds          Pt appears  In NAD   Speaking in  Complete  sentances  Save  For the  occasonal  Cough

## 2013-10-08 NOTE — ED Provider Notes (Signed)
CSN: [id number]     Arrival date & time 10/08/13  1354 History   None    Chief Complaint  Patient presents with  . Cough   (Consider location/radiation/quality/duration/timing/severity/associated sxs/prior Treatment) Patient is a 68 y.o. female presenting with cough. The history is provided by the patient.  Cough Cough characteristics:  Non-productive, dry, hacking and harsh Severity:  Moderate Onset quality:  Gradual Progression:  Worsening Chronicity:  New Smoker: no   Context: upper respiratory infection   Relieved by:  None tried Worsened by:  Nothing tried Ineffective treatments:  None tried Associated symptoms: ear fullness, ear pain, fever and rhinorrhea   Associated symptoms: no shortness of breath and no wheezing     Past Medical History  Diagnosis Date  . Osteoporosis   . CAD (coronary artery disease) CARDIOLOGIST-  DR Cristopher [place]--  LOV  2013    non-obs CAD by cath in 7/07 (mLAD 30-40%, EF 65%)  . History of rectal polyps     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  . Hypertension   . Hyperlipidemia   . History of mitral valve prolapse     PER PT REPORTED MVP  PER DR HARSHAW FROM CARDIAC CATH IN 1987 (APPROX)  . GERD (gastroesophageal reflux disease)   . Liver, polycystic   . Right ureteral stone   . Left nephrolithiasis     NON-OBSTRUCTIVE  . Lumbar radiculopathy   . DJD (degenerative joint disease)     KNEES  . DDD (degenerative disc disease), lumbar   . Type 2 diabetes mellitus    Past Surgical History  Procedure Laterality Date  . Heel spur surgery Left 2003  . Cardiac catheterization  10-17-2005   DR COOPER    NON-OBSTRUCTIVE CAD/   mLAD 30-40%/   NORMAL LVF/  EF 65%  . Transthoracic echocardiogram  04-07-2011    MILD LVH/  MILD FOCAL BASAL SEPTAL HYPERTROPHY/  EF  55-60% /  GRADE I DIASTOLIC DYSFUNCTION  . Cardiovascular stress test  04-12-2011  DR Carleene Overlie TAYLOR    LOW RISK STUDY/  NO ISCHEMIA/  EF 68%  . Cataract extraction w/  intraocular lens implant Right 2003  . Total abdominal hysterectomy w/ bilateral salpingoophorectomy  1978  . Colonoscopy w/ polypectomy  03-17-2011  . Cystoscopy with ureteroscopy Right 06/14/2013    Procedure: RIGHT  URETEROSCOPY,ureteral and urethral dilitation;  Surgeon: Claybon Jabs, MD;  [facility];  Service: Urology;  Laterality: Right;   Family History  Problem Relation Age of Onset  . Cancer Mother     breast  . Cancer Father     colon   History  Substance Use Topics  . Smoking status: Never Smoker   . Smokeless tobacco: Never Used  . Alcohol Use: No   OB History   Grav Para Term Preterm Abortions TAB SAB Ect Mult Living                 Review of Systems  Constitutional: Positive for fever.  HENT: Positive for congestion, ear pain and rhinorrhea.   Respiratory: Positive for cough. Negative for shortness of breath and wheezing.   Cardiovascular: Negative.   Gastrointestinal: Negative.     Allergies  Darvocet and Zofran  Home Medications   Prior to Admission medications   Medication Sig Start Date End Date Taking? Authorizing Provider  amLODipine (NORVASC) 10 MG tablet Take 10 mg by mouth every morning. 04/06/12   Barton Dubois, MD  azithromycin (ZITHROMAX Z-PAK)  250 MG tablet Take as directed on pack 10/08/13   Billy Fischer, MD  chlorpheniramine-HYDROcodone [facility]) 10-8 MG/5ML [facility] Take 5 mLs by mouth every 12 (twelve) hours as needed for cough. 10/08/13   Billy Fischer, MD  esomeprazole (NEXIUM) 40 MG capsule Take 1 capsule (40 mg total) by mouth 2 (two) times daily. 04/06/12   Barton Dubois, MD  ipratropium (ATROVENT) 0.06 % nasal spray Place 2 sprays into both nostrils 4 (four) times daily. 10/08/13   Billy Fischer, MD  oxyCODONE-acetaminophen (PERCOCET) 5-325 MG per tablet Take 1-2 tablets by mouth every 4 (four) hours as needed. 06/05/13   Ephraim Hamburger, MD  phenazopyridine (PYRIDIUM) 200 MG tablet Take 1 tablet  (200 mg total) by mouth 3 (three) times daily as needed for pain. 06/14/13   Claybon Jabs, MD  promethazine (PHENERGAN) 25 MG tablet Take 1 tablet (25 mg total) by mouth every 6 (six) hours as needed for nausea or vomiting. 06/05/13   Ephraim Hamburger, MD  sitaGLIPtin (JANUVIA) 100 MG tablet Take 100 mg by mouth daily.    Historical Provider, MD  tamsulosin (FLOMAX) 0.4 MG CAPS capsule Take 1 capsule (0.4 mg total) by mouth daily. 06/05/13   Ephraim Hamburger, MD  traMADol (ULTRAM) 50 MG tablet Take 1 tablet (50 mg total) by mouth every 6 (six) hours as needed for pain. 04/06/12   Barton Dubois, MD   BP 135/84  Pulse 85  Temp(Src) 98.6 F (37 C) (Oral)  Resp 20  SpO2 95% Physical Exam  Nursing note and vitals reviewed. Constitutional: She is oriented to person, place, and time. She appears well-developed and well-nourished.  HENT:  Right Ear: External ear normal.  Left Ear: External ear normal.  Mouth/Throat: Oropharynx is clear and moist.  Eyes: Conjunctivae are normal. Pupils are equal, round, and reactive to light.  Neck: Normal range of motion. Neck supple.  Cardiovascular: Normal heart sounds and intact distal pulses.   Pulmonary/Chest: Effort normal. She has rales.  Spasmodic cough   Lymphadenopathy:    She has no cervical adenopathy.  Neurological: She is alert and oriented to person, place, and time.  Skin: Skin is warm and dry.    ED Course  Procedures (including critical care time) Labs Review Labs Reviewed - No data to display  Imaging Review Dg Chest 2 View  10/08/2013   CLINICAL DATA:  Cough. Fever. Headache. Chest congestion. Back pain. Diabetes.  EXAM: CHEST  2 VIEW  COMPARISON:  04/05/2012  FINDINGS: Thoracic spondylosis.  The lungs appear clear. Cardiac and mediastinal margins appear normal.  IMPRESSION: 1. No acute thoracic findings. 2. Thoracic spondylosis.   Electronically Signed   By: Sherryl Barters M.D.   On: 10/08/2013 15:40    X-rays reviewed and report  per radiologist.  MDM   1. Acute bronchitis, unspecified organism        Billy Fischer, MD 10/08/13 [phone]

## 2013-10-15 ENCOUNTER — Emergency Department (HOSPITAL_COMMUNITY): Payer: Medicare Other

## 2013-10-15 ENCOUNTER — Emergency Department (HOSPITAL_COMMUNITY)
Admission: EM | Admit: 2013-10-15 | Discharge: 2013-10-16 | Disposition: A | Discharge status: Home / Self Care | Payer: Medicare Other | Attending: Emergency Medicine | Admitting: Emergency Medicine

## 2013-10-15 ENCOUNTER — Encounter (HOSPITAL_COMMUNITY): Payer: Self-pay | Admitting: Emergency Medicine

## 2013-10-15 DIAGNOSIS — J209 Acute bronchitis, unspecified: Secondary | ICD-10-CM | POA: Diagnosis not present

## 2013-10-15 DIAGNOSIS — E119 Type 2 diabetes mellitus without complications: Secondary | ICD-10-CM | POA: Insufficient documentation

## 2013-10-15 DIAGNOSIS — R0602 Shortness of breath: Secondary | ICD-10-CM | POA: Diagnosis present

## 2013-10-15 DIAGNOSIS — Z8739 Personal history of other diseases of the musculoskeletal system and connective tissue: Secondary | ICD-10-CM | POA: Diagnosis not present

## 2013-10-15 DIAGNOSIS — Z87448 Personal history of other diseases of urinary system: Secondary | ICD-10-CM | POA: Insufficient documentation

## 2013-10-15 DIAGNOSIS — Z9889 Other specified postprocedural states: Secondary | ICD-10-CM | POA: Insufficient documentation

## 2013-10-15 DIAGNOSIS — K219 Gastro-esophageal reflux disease without esophagitis: Secondary | ICD-10-CM | POA: Insufficient documentation

## 2013-10-15 DIAGNOSIS — I2510 Atherosclerotic heart disease of native coronary artery without angina pectoris: Secondary | ICD-10-CM | POA: Diagnosis not present

## 2013-10-15 DIAGNOSIS — I10 Essential (primary) hypertension: Secondary | ICD-10-CM | POA: Diagnosis not present

## 2013-10-15 DIAGNOSIS — J40 Bronchitis, not specified as acute or chronic: Secondary | ICD-10-CM

## 2013-10-15 DIAGNOSIS — Z79899 Other long term (current) drug therapy: Secondary | ICD-10-CM | POA: Diagnosis not present

## 2013-10-15 LAB — TROPONIN I: Troponin I: <0.3 ng/mL (ref <0.30)

## 2013-10-15 LAB — COMPREHENSIVE METABOLIC PANEL
ALT: 19 U/L (ref 0–35)
ANION GAP: 14 (ref 5–15)
AST: 31 U/L (ref 0–37)
Albumin: 4 g/dL (ref 3.5–5.2)
Alkaline Phosphatase: 86 U/L (ref 39–117)
BUN: 32 mg/dL — ABNORMAL HIGH (ref 6–23)
CALCIUM: 9.8 mg/dL (ref 8.4–10.5)
CO2: 26 meq/L (ref 19–32)
CREATININE: 1.21 mg/dL — AB (ref 0.50–1.10)
Chloride: 103 mEq/L (ref 96–112)
GFR calc non Af Amer: 46 mL/min — ABNORMAL LOW (ref >90)
GFR, EST AFRICAN AMERICAN: 53 mL/min — AB (ref >90)
GLUCOSE: 104 mg/dL — AB (ref 70–99)
Potassium: 3.7 mEq/L (ref 3.7–5.3)
Sodium: 143 mEq/L (ref 137–147)
Total Bilirubin: 0.4 mg/dL (ref 0.3–1.2)
Total Protein: 8.6 g/dL — ABNORMAL HIGH (ref 6.0–8.3)

## 2013-10-15 LAB — CBC WITH DIFFERENTIAL/PLATELET
BASOS ABS: 0 10*3/uL (ref 0.0–0.1)
Basophils Relative: 0 % (ref 0–1)
Eosinophils Absolute: 0.1 10*3/uL (ref 0.0–0.7)
Eosinophils Relative: 1 % (ref 0–5)
HCT: 41.3 % (ref 36.0–46.0)
Hemoglobin: 13.9 g/dL (ref 12.0–15.0)
LYMPHS PCT: 42 % (ref 12–46)
Lymphs Abs: 3.7 10*3/uL (ref 0.7–4.0)
MCH: 28 pg (ref 26.0–34.0)
MCHC: 33.7 g/dL (ref 30.0–36.0)
MCV: 83.1 fL (ref 78.0–100.0)
MONO ABS: 0.7 10*3/uL (ref 0.1–1.0)
Monocytes Relative: 8 % (ref 3–12)
Neutro Abs: 4.3 10*3/uL (ref 1.7–7.7)
Neutrophils Relative %: 49 % (ref 43–77)
Platelets: 292 10*3/uL (ref 150–400)
RBC: 4.97 MIL/uL (ref 3.87–5.11)
RDW: 15.4 % (ref 11.5–15.5)
WBC: 8.8 10*3/uL (ref 4.0–10.5)

## 2013-10-15 LAB — D-DIMER, QUANTITATIVE (NOT AT ARMC): D DIMER QUANT: 1.22 ug{FEU}/mL — AB (ref 0.00–0.48)

## 2013-10-15 MED ORDER — METHYLPREDNISOLONE SODIUM SUCC 125 MG IJ SOLR
125.0000 mg | Freq: Once | INTRAMUSCULAR | Status: AC
  Administered 2013-10-15: 125 mg via INTRAVENOUS
  Filled 2013-10-15: qty 2

## 2013-10-15 MED ORDER — IPRATROPIUM-ALBUTEROL 0.5-2.5 (3) MG/3ML IN SOLN
3.0000 mL | Freq: Once | RESPIRATORY_TRACT | Status: AC
  Administered 2013-10-15: 3 mL via RESPIRATORY_TRACT
  Filled 2013-10-15: qty 3

## 2013-10-15 MED ORDER — IOHEXOL 350 MG/ML SOLN
100.0000 mL | Freq: Once | INTRAVENOUS | Status: AC | PRN
  Administered 2013-10-15: 100 mL via INTRAVENOUS

## 2013-10-15 NOTE — ED Notes (Signed)
RT contacted to start neb tx 

## 2013-10-15 NOTE — ED Notes (Signed)
RT called for breathing tx. 

## 2013-10-15 NOTE — ED Provider Notes (Signed)
CSN: [id number]     Arrival date & time 10/15/13  2040 History   First MD Initiated Contact with Patient 10/15/13 2102     Chief Complaint  Patient presents with  . Shortness of Breath     (Consider location/radiation/quality/duration/timing/severity/associated sxs/prior Treatment) HPI Comments: Patient with a two-week history of shortness of breath and dry cough. She was diagnosed with bronchitis on July 21 at urgent care. She was placed on antibiotics by her PCP on July 22  and finished a course of azithromycin. She denies any fever. She has some chest tightness with coughing. It is not exertional or pleuritic. She denies any leg pain or leg swelling. She denies any cardiac history. Denies any COPD or asthma. she is not a smoker. She endorses dry cough that keeps her up at night. Denies any leg pain or leg swelling.  The history is provided by the patient. The history is limited by the condition of the patient.    Past Medical History  Diagnosis Date  . Osteoporosis   . CAD (coronary artery disease) CARDIOLOGIST-  DR Cristopher Peru--  LOV  2013    non-obs CAD by cath in 7/07 (mLAD 30-40%, EF 65%)  . History of rectal polyps     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  . Hypertension   . Hyperlipidemia   . History of mitral valve prolapse     PER PT REPORTED MVP  PER DR HARSHAW FROM CARDIAC CATH IN 1987 (APPROX)  . GERD (gastroesophageal reflux disease)   . Liver, polycystic   . Right ureteral stone   . Left nephrolithiasis     NON-OBSTRUCTIVE  . Lumbar radiculopathy   . DJD (degenerative joint disease)     KNEES  . DDD (degenerative disc disease), lumbar   . Type 2 diabetes mellitus    Past Surgical History  Procedure Laterality Date  . Heel spur surgery Left 2003  . Cardiac catheterization  10-17-2005   DR COOPER    NON-OBSTRUCTIVE CAD/   mLAD 30-40%/   NORMAL LVF/  EF 65%  . Transthoracic echocardiogram  04-07-2011    MILD LVH/  MILD FOCAL BASAL SEPTAL  HYPERTROPHY/  EF  55-60% /  GRADE I DIASTOLIC DYSFUNCTION  . Cardiovascular stress test  04-12-2011  DR Carleene Overlie TAYLOR    LOW RISK STUDY/  NO ISCHEMIA/  EF 68%  . Cataract extraction w/ intraocular lens implant Right 2003  . Total abdominal hysterectomy w/ bilateral salpingoophorectomy  1978  . Colonoscopy w/ polypectomy  03-17-2011  . Cystoscopy with ureteroscopy Right 06/14/2013    Procedure: RIGHT  URETEROSCOPY,ureteral and urethral dilitation;  Surgeon: Claybon Jabs, MD;  Location[facility];  Service: Urology;  Laterality: Right;   Family History  Problem Relation Age of Onset  . Cancer Mother     breast  . Cancer Father     colon   History  Substance Use Topics  . Smoking status: Never Smoker   . Smokeless tobacco: Never Used  . Alcohol Use: No   OB History   Grav Para Term Preterm Abortions TAB SAB Ect Mult Living                 Review of Systems  Constitutional: Positive for activity change, appetite change and fatigue. Negative for fever.  HENT: Positive for congestion and rhinorrhea.   Respiratory: Positive for cough, chest tightness and shortness of breath.   Cardiovascular: Negative for chest pain.  Gastrointestinal: Negative  for nausea, vomiting and abdominal pain.  Genitourinary: Negative for dysuria, hematuria, vaginal bleeding and vaginal discharge.  Musculoskeletal: Negative for arthralgias and myalgias.  Skin: Negative for rash.  Neurological: Negative for dizziness, weakness and headaches.  A complete 10 system review of systems was obtained and all systems are negative except as noted in the HPI and PMH.      Allergies  Darvocet and Zofran  Home Medications   Prior to Admission medications   Medication Sig Start Date End Date Taking? Authorizing Provider  amLODipine (NORVASC) 10 MG tablet Take 10 mg by mouth every morning. 04/06/12  Yes Barton Dubois, MD  chlorpheniramine-HYDROcodone [facility]) 10-8 MG/5ML LQCR Take  5 mLs by mouth every 12 (twelve) hours as needed for cough. 10/08/13  Yes Billy Fischer, MD  esomeprazole (NEXIUM) 40 MG capsule Take 1 capsule (40 mg total) by mouth 2 (two) times daily. 04/06/12  Yes Barton Dubois, MD  ipratropium (ATROVENT) 0.06 % nasal spray Place 2 sprays into both nostrils 4 (four) times daily. 10/08/13  Yes Billy Fischer, MD  sitaGLIPtin (JANUVIA) 100 MG tablet Take 100 mg by mouth daily.   Yes Historical Provider, MD  tamsulosin (FLOMAX) 0.4 MG CAPS capsule Take 1 capsule (0.4 mg total) by mouth daily. 06/05/13  Yes Ephraim Hamburger, MD  albuterol (PROVENTIL HFA;VENTOLIN HFA) 108 (90 BASE) MCG/ACT inhaler Inhale 1-2 puffs into the lungs every 6 (six) hours as needed for wheezing or shortness of breath. 10/16/13   Ezequiel Essex, MD  doxycycline (VIBRAMYCIN) 100 MG capsule Take 1 capsule (100 mg total) by mouth 2 (two) times daily. 10/16/13   Ezequiel Essex, MD  guaiFENesin-codeine 100-10 MG/5ML syrup Take 5 mLs by mouth 3 (three) times daily as needed for cough. 10/16/13   Ezequiel Essex, MD   BP 143/76  Pulse 79  Temp(Src) 98.7 F (37.1 C) (Oral)  Resp 18  SpO2 97% Physical Exam  Nursing note and vitals reviewed. Constitutional: She is oriented to person, place, and time. She appears well-developed and well-nourished. No distress.  Frequent dry cough  HENT:  Head: Normocephalic and atraumatic.  Mouth/Throat: Oropharynx is clear and moist. No oropharyngeal exudate.  Eyes: Conjunctivae and EOM are normal. Pupils are equal, round, and reactive to light.  Neck: Normal range of motion. Neck supple.  No meningismus.  Cardiovascular: Normal rate, regular rhythm, normal heart sounds and intact distal pulses.   No murmur heard. Pulmonary/Chest: Effort normal. No respiratory distress. She has no wheezes.  Scattered rhonchi and expiratory wheezing  Abdominal: Soft. There is no tenderness. There is no rebound and no guarding.  Musculoskeletal: Normal range of motion. She  exhibits no edema and no tenderness.  Neurological: She is alert and oriented to person, place, and time. No cranial nerve deficit. She exhibits normal muscle tone. Coordination normal.  No ataxia on finger to nose bilaterally. No pronator drift. 5/5 strength throughout. CN 2-12 intact. Negative Romberg. Equal grip strength. Sensation intact. Gait is normal.   Skin: Skin is warm.  Psychiatric: She has a normal mood and affect. Her behavior is normal.    ED Course  Procedures (including critical care time) Labs Review Labs Reviewed  COMPREHENSIVE METABOLIC PANEL - Abnormal; Notable for the following:    Glucose, Bld 104 (*)    BUN 32 (*)    Creatinine, Ser 1.21 (*)    Total Protein 8.6 (*)    GFR calc non Af Amer 46 (*)    GFR calc Af Wyvonnia Lora  53 (*)    All other components within normal limits  D-DIMER, QUANTITATIVE - Abnormal; Notable for the following:    D-Dimer, Quant 1.22 (*)    All other components within normal limits  CBC WITH DIFFERENTIAL  TROPONIN I  TROPONIN I    Imaging Review Dg Chest 2 View  10/15/2013   CLINICAL DATA:  Shortness of breath and cough.  EXAM: CHEST  2 VIEW  COMPARISON:  10/08/2013  FINDINGS: The signal elevation of the right hemidiaphragm. The heart size and mediastinal contours are within normal limits. Both lungs are clear. The visualized skeletal structures are unremarkable.  IMPRESSION: No active cardiopulmonary disease.   Electronically Signed   By: Lucienne Capers M.D.   On: 10/15/2013 21:34   Ct Angio Chest Pe W/cm &/or Wo Cm  10/16/2013   CLINICAL DATA:  Chest pain and shortness of breath.  EXAM: CT ANGIOGRAPHY CHEST WITH CONTRAST  TECHNIQUE: Multidetector CT imaging of the chest was performed using the standard protocol during bolus administration of intravenous contrast. Multiplanar CT image reconstructions and MIPs were obtained to evaluate the vascular anatomy.  CONTRAST:  134mL OMNIPAQUE IOHEXOL 350 MG/ML SOLN  COMPARISON:  Chest x-ray dated  10/15/2013 and CT scan dated 04/06/2011  FINDINGS: There are no pulmonary emboli, infiltrates, or effusions. There is minimal linear atelectasis at the lung bases. Heart size is normal. No osseous abnormalities.  Note is made of numerous prominent cysts in the liver, unchanged since the prior study.  Review of the MIP images confirms the above findings.  IMPRESSION: No significant acute abnormality. Specifically, no pulmonary emboli.   Electronically Signed   By: Rozetta Nunnery M.D.   On: 10/16/2013 00:18     EKG Interpretation   Date/Time:  Tuesday October 15 2013 20:46:01 EDT Ventricular Rate:  101 PR Interval:  148 QRS Duration: 86 QT Interval:  363 QTC Calculation: 470 R Axis:   -45 Text Interpretation:  Sinus tachycardia Left anterior fascicular block  Abnormal R-wave progression, late transition Left ventricular hypertrophy  No significant change was found Confirmed by Wyvonnia Dusky  MD, Jacqualynn Parco ([id number])  on 10/15/2013 8:49:20 PM      MDM   Final diagnoses:  Bronchitis   2 weeks of cough, congestion, SOB.  Treated for bronchitis with zithromax which she completed 5 days ago. Some chest tightness.  EKG nsr. CXR neg Lungs clear with scattered rhonchi  Nebs given, no wheezing. Patient is diabetic, will hold steroids. Ambulatory without desaturation.  Troponin negative x2.  No evidence of PE. Low suspicion for ACS.  Frequent dry coughing.  Treat for bronchospasm/bronchitis with bronchodilators, decongestants, cough medication.  Follow up with PCP.  Return precautions discussed.   Ezequiel Essex, MD 10/16/13 [id number]

## 2013-10-15 NOTE — ED Notes (Signed)
Pt walked with steady gait, but could not go far. Lowest O2 was 96, highest 100, steady around 98. Pt became increasingly tired, cold and short of breath, unable to go more than ~25 yards.

## 2013-10-15 NOTE — ED Notes (Signed)
Pt reports she started to cough last week, started to have severe SOB yesterday.  Pt also reports recently getting dx with bronchitis and was given inhaler without relief.

## 2013-10-16 LAB — TROPONIN I: Troponin I: <0.3 ng/mL (ref <0.30)

## 2013-10-16 MED ORDER — DOXYCYCLINE HYCLATE 100 MG PO CAPS: 100.0000 mg | ORAL_CAPSULE | Freq: Two times a day (BID) | ORAL | Status: DC

## 2013-10-16 MED ORDER — ALBUTEROL SULFATE HFA 108 (90 BASE) MCG/ACT IN AERS: 1.0000 | INHALATION_SPRAY | Freq: Four times a day (QID) | RESPIRATORY_TRACT | Status: DC | PRN

## 2013-10-16 MED ORDER — GUAIFENESIN-CODEINE 100-10 MG/5ML PO SOLN: 5.0000 mL | Freq: Three times a day (TID) | ORAL | Status: DC | PRN

## 2013-10-16 NOTE — Discharge Instructions (Signed)
Acute Bronchitis Take the cough medicine as prescribed Follow up with your doctor. Return to the ED if you develop new or worsening symptoms. Bronchitis is inflammation of the airways that extend from the windpipe into the lungs (bronchi). The inflammation often causes mucus to develop. This leads to a cough, which is the most common symptom of bronchitis.  In acute bronchitis, the condition usually develops suddenly and goes away over time, usually in a couple weeks. Smoking, allergies, and asthma can make bronchitis worse. Repeated episodes of bronchitis may cause further lung problems.  CAUSES Acute bronchitis is most often caused by the same virus that causes a cold. The virus can spread from person to person (contagious) through coughing, sneezing, and touching contaminated objects. SIGNS AND SYMPTOMS   Cough.   Fever.   Coughing up mucus.   Body aches.   Chest congestion.   Chills.   Shortness of breath.   Sore throat.  DIAGNOSIS  Acute bronchitis is usually diagnosed through a physical exam. Your health care provider will also ask you questions about your medical history. Tests, such as chest X-rays, are sometimes done to rule out other conditions.  TREATMENT  Acute bronchitis usually goes away in a couple weeks. Oftentimes, no medical treatment is necessary. Medicines are sometimes given for relief of fever or cough. Antibiotic medicines are usually not needed but may be prescribed in certain situations. In some cases, an inhaler may be recommended to help reduce shortness of breath and control the cough. A cool mist vaporizer may also be used to help thin bronchial secretions and make it easier to clear the chest.  HOME CARE INSTRUCTIONS  Get plenty of rest.   Drink enough fluids to keep your urine clear or pale yellow (unless you have a medical condition that requires fluid restriction). Increasing fluids may help thin your respiratory secretions (sputum) and reduce  chest congestion, and it will prevent dehydration.   Take medicines only as directed by your health care provider.  If you were prescribed an antibiotic medicine, finish it all even if you start to feel better.  Avoid smoking and secondhand smoke. Exposure to cigarette smoke or irritating chemicals will make bronchitis worse. If you are a smoker, consider using nicotine gum or skin patches to help control withdrawal symptoms. Quitting smoking will help your lungs heal faster.   Reduce the chances of another bout of acute bronchitis by washing your hands frequently, avoiding people with cold symptoms, and trying not to touch your hands to your mouth, nose, or eyes.   Keep all follow-up visits as directed by your health care provider.  SEEK MEDICAL CARE IF: Your symptoms do not improve after 1 week of treatment.  SEEK IMMEDIATE MEDICAL CARE IF:  You develop an increased fever or chills.   You have chest pain.   You have severe shortness of breath.  You have bloody sputum.   You develop dehydration.  You faint or repeatedly feel like you are going to pass out.  You develop repeated vomiting.  You develop a severe headache. MAKE SURE YOU:   Understand these instructions.  Will watch your condition.  Will get help right away if you are not doing well or get worse. Document Released: 04/14/2004 Document Revised: 07/22/2013 Document Reviewed: 08/28/2012 [facility] Patient Information 2015 [place], [place]. This information is not intended to replace advice given to you by your health care provider. Make sure you discuss any questions you have with your health care provider.

## 2013-10-16 NOTE — ED Notes (Signed)
Patient transported to CT 

## 2014-03-28 ENCOUNTER — Observation Stay (HOSPITAL_COMMUNITY)
Admission: EM | Admit: 2014-03-28 | Discharge: 2014-03-31 | Disposition: A | Discharge status: Home / Self Care | Payer: Medicare Other | Attending: Internal Medicine | Admitting: Internal Medicine

## 2014-03-28 ENCOUNTER — Emergency Department (HOSPITAL_COMMUNITY): Payer: Medicare Other

## 2014-03-28 ENCOUNTER — Encounter (HOSPITAL_COMMUNITY): Payer: Self-pay | Admitting: *Deleted

## 2014-03-28 DIAGNOSIS — I10 Essential (primary) hypertension: Secondary | ICD-10-CM | POA: Diagnosis present

## 2014-03-28 DIAGNOSIS — N200 Calculus of kidney: Secondary | ICD-10-CM | POA: Diagnosis not present

## 2014-03-28 DIAGNOSIS — Z79899 Other long term (current) drug therapy: Secondary | ICD-10-CM | POA: Diagnosis not present

## 2014-03-28 DIAGNOSIS — E119 Type 2 diabetes mellitus without complications: Secondary | ICD-10-CM | POA: Diagnosis not present

## 2014-03-28 DIAGNOSIS — K219 Gastro-esophageal reflux disease without esophagitis: Secondary | ICD-10-CM | POA: Insufficient documentation

## 2014-03-28 DIAGNOSIS — R079 Chest pain, unspecified: Secondary | ICD-10-CM | POA: Diagnosis present

## 2014-03-28 DIAGNOSIS — N201 Calculus of ureter: Secondary | ICD-10-CM | POA: Insufficient documentation

## 2014-03-28 DIAGNOSIS — M1990 Unspecified osteoarthritis, unspecified site: Secondary | ICD-10-CM | POA: Diagnosis not present

## 2014-03-28 DIAGNOSIS — R05 Cough: Secondary | ICD-10-CM

## 2014-03-28 DIAGNOSIS — Z9889 Other specified postprocedural states: Secondary | ICD-10-CM | POA: Diagnosis not present

## 2014-03-28 DIAGNOSIS — M79605 Pain in left leg: Secondary | ICD-10-CM

## 2014-03-28 DIAGNOSIS — D3A026 Benign carcinoid tumor of the rectum: Secondary | ICD-10-CM

## 2014-03-28 DIAGNOSIS — R110 Nausea: Secondary | ICD-10-CM | POA: Diagnosis not present

## 2014-03-28 DIAGNOSIS — I2510 Atherosclerotic heart disease of native coronary artery without angina pectoris: Secondary | ICD-10-CM | POA: Diagnosis not present

## 2014-03-28 DIAGNOSIS — E785 Hyperlipidemia, unspecified: Secondary | ICD-10-CM | POA: Diagnosis present

## 2014-03-28 DIAGNOSIS — R0602 Shortness of breath: Secondary | ICD-10-CM | POA: Diagnosis not present

## 2014-03-28 DIAGNOSIS — J40 Bronchitis, not specified as acute or chronic: Principal | ICD-10-CM | POA: Insufficient documentation

## 2014-03-28 DIAGNOSIS — Z792 Long term (current) use of antibiotics: Secondary | ICD-10-CM | POA: Insufficient documentation

## 2014-03-28 DIAGNOSIS — J069 Acute upper respiratory infection, unspecified: Secondary | ICD-10-CM | POA: Diagnosis present

## 2014-03-28 DIAGNOSIS — M810 Age-related osteoporosis without current pathological fracture: Secondary | ICD-10-CM | POA: Insufficient documentation

## 2014-03-28 DIAGNOSIS — Z23 Encounter for immunization: Secondary | ICD-10-CM | POA: Insufficient documentation

## 2014-03-28 DIAGNOSIS — Z8679 Personal history of other diseases of the circulatory system: Secondary | ICD-10-CM

## 2014-03-28 DIAGNOSIS — R059 Cough, unspecified: Secondary | ICD-10-CM | POA: Diagnosis present

## 2014-03-28 DIAGNOSIS — M1712 Unilateral primary osteoarthritis, left knee: Secondary | ICD-10-CM

## 2014-03-28 DIAGNOSIS — I5030 Unspecified diastolic (congestive) heart failure: Secondary | ICD-10-CM

## 2014-03-28 DIAGNOSIS — M519 Unspecified thoracic, thoracolumbar and lumbosacral intervertebral disc disorder: Secondary | ICD-10-CM

## 2014-03-28 DIAGNOSIS — M5416 Radiculopathy, lumbar region: Secondary | ICD-10-CM

## 2014-03-28 DIAGNOSIS — K7689 Other specified diseases of liver: Secondary | ICD-10-CM

## 2014-03-28 DIAGNOSIS — Q446 Cystic disease of liver: Secondary | ICD-10-CM | POA: Diagnosis not present

## 2014-03-28 DIAGNOSIS — R053 Chronic cough: Secondary | ICD-10-CM | POA: Diagnosis present

## 2014-03-28 LAB — CBC
HEMATOCRIT: 39.7 % (ref 36.0–46.0)
Hemoglobin: 13.2 g/dL (ref 12.0–15.0)
MCH: 28.8 pg (ref 26.0–34.0)
MCHC: 33.2 g/dL (ref 30.0–36.0)
MCV: 86.5 fL (ref 78.0–100.0)
Platelets: 233 10*3/uL (ref 150–400)
RBC: 4.59 MIL/uL (ref 3.87–5.11)
RDW: 15.6 % — AB (ref 11.5–15.5)
WBC: 7.7 10*3/uL (ref 4.0–10.5)

## 2014-03-28 LAB — COMPREHENSIVE METABOLIC PANEL
ALBUMIN: 3.5 g/dL (ref 3.5–5.2)
ALT: 27 U/L (ref 0–35)
ANION GAP: 7 (ref 5–15)
AST: 33 U/L (ref 0–37)
Alkaline Phosphatase: 74 U/L (ref 39–117)
BILIRUBIN TOTAL: 0.4 mg/dL (ref 0.3–1.2)
BUN: 17 mg/dL (ref 6–23)
CO2: 27 mmol/L (ref 19–32)
CREATININE: 0.94 mg/dL (ref 0.50–1.10)
Calcium: 9.3 mg/dL (ref 8.4–10.5)
Chloride: 108 mEq/L (ref 96–112)
GFR calc Af Amer: 71 mL/min — ABNORMAL LOW (ref >90)
GFR, EST NON AFRICAN AMERICAN: 61 mL/min — AB (ref >90)
Glucose, Bld: 138 mg/dL — ABNORMAL HIGH (ref 70–99)
Potassium: 4.1 mmol/L (ref 3.5–5.1)
SODIUM: 142 mmol/L (ref 135–145)
Total Protein: 6.9 g/dL (ref 6.0–8.3)

## 2014-03-28 LAB — I-STAT TROPONIN, ED: Troponin i, poc: 0 ng/mL (ref 0.00–0.08)

## 2014-03-28 MED ORDER — SODIUM CHLORIDE 0.9 % IV SOLN
INTRAVENOUS | Status: DC
  Administered 2014-03-28: 23:00:00 via INTRAVENOUS
  Administered 2014-03-29: 1000 mL via INTRAVENOUS
  Administered 2014-03-30 (×2): via INTRAVENOUS

## 2014-03-28 MED ORDER — ALBUTEROL SULFATE (2.5 MG/3ML) 0.083% IN NEBU
2.5000 mg | INHALATION_SOLUTION | RESPIRATORY_TRACT | Status: DC
  Administered 2014-03-29: 2.5 mg via RESPIRATORY_TRACT
  Filled 2014-03-28: qty 3

## 2014-03-28 MED ORDER — HYDROMORPHONE HCL 1 MG/ML IJ SOLN
0.5000 mg | INTRAMUSCULAR | Status: AC | PRN
  Administered 2014-03-29 (×3): 0.5 mg via INTRAVENOUS
  Filled 2014-03-28 (×3): qty 1

## 2014-03-28 MED ORDER — ASPIRIN 81 MG PO CHEW
324.0000 mg | CHEWABLE_TABLET | Freq: Once | ORAL | Status: AC
  Administered 2014-03-28: 324 mg via ORAL
  Filled 2014-03-28: qty 4

## 2014-03-28 MED ORDER — IPRATROPIUM-ALBUTEROL 0.5-2.5 (3) MG/3ML IN SOLN
3.0000 mL | Freq: Once | RESPIRATORY_TRACT | Status: AC
  Administered 2014-03-28: 3 mL via RESPIRATORY_TRACT
  Filled 2014-03-28: qty 3

## 2014-03-28 MED ORDER — NITROGLYCERIN 0.4 MG SL SUBL
0.4000 mg | SUBLINGUAL_TABLET | SUBLINGUAL | Status: DC | PRN
  Administered 2014-03-28: 0.4 mg via SUBLINGUAL
  Filled 2014-03-28: qty 1

## 2014-03-28 NOTE — ED Notes (Signed)
Pt in c/o chest pain and shortness of breath that was worse today, reports cough for the last two months that she has been unable to get rid of, speaking in full sentences, no distress noted

## 2014-03-28 NOTE — ED Provider Notes (Signed)
CSN: [id number]     Arrival date & time 03/28/14  2058 History   First MD Initiated Contact with Patient 03/28/14 2221     Chief Complaint  Patient presents with  . Chest Pain  . Cough  . Shortness of Breath     (Consider location/radiation/quality/duration/timing/severity/associated sxs/prior Treatment) HPI   Brenda Flores is a 69 y.o. female with past medical history significant for non-insulin-dependent diabetes, hypertension, hyperlipidemia complaining of acute onset of left-sided pressure at 4 PM today pain radiates to the left arm, also towards the back and neck is rated at 10 out of 10. This associated with shortness of breath, epigastric abdominal pain and nausea. States she's never had a pain like this before. Patient states she's being treated for bronchitis, she's had a cough for greater than 2 months. She took her last dose of prednisone today. She was treated with unknown antibiotics and various antitussives. States that the chest pain is not pleuritic, he denies any history of DVT or PE, recent immobilizations, exogenous estrogen, calf pain leg swelling.   Past Medical History  Diagnosis Date  . Osteoporosis   . CAD (coronary artery disease) CARDIOLOGIST-  DR Cristopher Peru--  LOV  2013    non-obs CAD by cath in 7/07 (mLAD 30-40%, EF 65%)  . History of rectal polyps     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  . Hypertension   . Hyperlipidemia   . History of mitral valve prolapse     PER PT REPORTED MVP  PER DR HARSHAW FROM CARDIAC CATH IN 1987 (APPROX)  . GERD (gastroesophageal reflux disease)   . Liver, polycystic   . Right ureteral stone   . Left nephrolithiasis     NON-OBSTRUCTIVE  . Lumbar radiculopathy   . DJD (degenerative joint disease)     KNEES  . DDD (degenerative disc disease), lumbar   . Type 2 diabetes mellitus    Past Surgical History  Procedure Laterality Date  . Heel spur surgery Left 2003  . Cardiac catheterization   10-17-2005   DR COOPER    NON-OBSTRUCTIVE CAD/   mLAD 30-40%/   NORMAL LVF/  EF 65%  . Transthoracic echocardiogram  04-07-2011    MILD LVH/  MILD FOCAL BASAL SEPTAL HYPERTROPHY/  EF  55-60% /  GRADE I DIASTOLIC DYSFUNCTION  . Cardiovascular stress test  04-12-2011  DR Carleene Overlie TAYLOR    LOW RISK STUDY/  NO ISCHEMIA/  EF 68%  . Cataract extraction w/ intraocular lens implant Right 2003  . Total abdominal hysterectomy w/ bilateral salpingoophorectomy  1978  . Colonoscopy w/ polypectomy  03-17-2011  . Cystoscopy with ureteroscopy Right 06/14/2013    Procedure: RIGHT  URETEROSCOPY,ureteral and urethral dilitation;  Surgeon: Claybon Jabs, MD;  Location: [facility];  Service: Urology;  Laterality: Right;   Family History  Problem Relation Age of Onset  . Cancer Mother     breast  . Cancer Father     colon   History  Substance Use Topics  . Smoking status: Never Smoker   . Smokeless tobacco: Never Used  . Alcohol Use: No   OB History    No data available     Review of Systems  10 systems reviewed and found to be negative, except as noted in the HPI.   Allergies  Darvocet and Zofran  Home Medications   Prior to Admission medications   Medication Sig Start Date End Date Taking? Authorizing Provider  albuterol (  PROVENTIL HFA;VENTOLIN HFA) 108 (90 BASE) MCG/ACT inhaler Inhale 1-2 puffs into the lungs every 6 (six) hours as needed for wheezing or shortness of breath. 10/16/13   Ezequiel Essex, MD  amLODipine (NORVASC) 10 MG tablet Take 10 mg by mouth every morning. 04/06/12   Barton Dubois, MD  chlorpheniramine-HYDROcodone [facility] ER) 10-8 MG/5ML LQCR Take 5 mLs by mouth every 12 (twelve) hours as needed for cough. 10/08/13   Billy Fischer, MD  doxycycline (VIBRAMYCIN) 100 MG capsule Take 1 capsule (100 mg total) by mouth 2 (two) times daily. 10/16/13   Ezequiel Essex, MD  esomeprazole (NEXIUM) 40 MG capsule Take 1 capsule (40 mg total) by mouth 2 (two)  times daily. 04/06/12   Barton Dubois, MD  guaiFENesin-codeine 100-10 MG/5ML syrup Take 5 mLs by mouth 3 (three) times daily as needed for cough. 10/16/13   Ezequiel Essex, MD  ipratropium (ATROVENT) 0.06 % nasal spray Place 2 sprays into both nostrils 4 (four) times daily. 10/08/13   Billy Fischer, MD  sitaGLIPtin (JANUVIA) 100 MG tablet Take 100 mg by mouth daily.    Historical Provider, MD  tamsulosin (FLOMAX) 0.4 MG CAPS capsule Take 1 capsule (0.4 mg total) by mouth daily. 06/05/13   Ephraim Hamburger, MD   BP 177/105 mmHg  Pulse 97  Temp(Src) 98.2 F (36.8 C) (Oral)  Resp 24  SpO2 97% Physical Exam  Constitutional: She is oriented to person, place, and time. She appears well-developed and well-nourished. No distress.  HENT:  Head: Normocephalic and atraumatic.  Mouth/Throat: Oropharynx is clear and moist.  Eyes: Conjunctivae and EOM are normal. Pupils are equal, round, and reactive to light.  Neck: Normal range of motion. No JVD present.  Cardiovascular: Normal rate, regular rhythm and intact distal pulses.   Pulmonary/Chest: Effort normal and breath sounds normal. No stridor. No respiratory distress. She has no wheezes. She has no rales. She exhibits no tenderness.  Abdominal: Soft. Bowel sounds are normal. She exhibits no distension and no mass. There is no tenderness. There is no rebound and no guarding.  Musculoskeletal: Normal range of motion. She exhibits no edema or tenderness.  No calf asymmetry, superficial collaterals, palpable cords, edema, Homans sign negative bilaterally.    Neurological: She is alert and oriented to person, place, and time.  Skin: Skin is warm.  Psychiatric: She has a normal mood and affect.  Nursing note and vitals reviewed.   ED Course  Procedures (including critical care time) Labs Review Labs Reviewed  COMPREHENSIVE METABOLIC PANEL - Abnormal; Notable for the following:    Glucose, Bld 138 (*)    GFR calc non Af Amer 61 (*)    GFR calc Af  Amer 71 (*)    All other components within normal limits  CBC - Abnormal; Notable for the following:    RDW 15.6 (*)    All other components within normal limits  I-STAT TROPOININ, ED    Imaging Review Dg Chest 2 View  03/28/2014   CLINICAL DATA:  Chest pain and short of breath  EXAM: CHEST  2 VIEW  COMPARISON:  CT 10/15/2013  FINDINGS: Normal cardiac silhouette. There is cysts within the liver which tenth the diaphragm and unchanged from prior. No effusion, infiltrate, or pneumothorax. Degenerative osteophytosis of the thoracic spine.  IMPRESSION: No acute cardiopulmonary process.   Electronically Signed   By: Suzy Bouchard M.D.   On: 03/28/2014 23:16     EKG Interpretation None      MDM  Final diagnoses:  Chest pain, unspecified chest pain type  Bronchitis    Filed Vitals:   03/28/14 2107 03/28/14 2109  BP: 177/105   Pulse: 97   Temp:  98.2 F (36.8 C)  TempSrc:  Oral  Resp: 24   SpO2: 97%     Medications  aspirin chewable tablet 324 mg (not administered)  nitroGLYCERIN (NITROSTAT) SL tablet 0.4 mg (not administered)  0.9 %  sodium chloride infusion (not administered)  ipratropium-albuterol (DUONEB) 0.5-2.5 (3) MG/3ML nebulizer solution 3 mL (3 mLs Nebulization Given 03/28/14 2111)    Brenda Flores is a pleasant 69 y.o. female presenting with left-sided chest pressure radiating to the left arm, left jaw, left back and abdomen this was acute in onset tonight at 4 PM. Asian has several cardiac risk factors, she's been seen for this in the past and has had negative casts that these were not recent. EKG is nonischemic, chest x-ray with no infiltrate. Blood work with no significant abnormalities. Considering patient's cardiac risk factors of think is reasonable to admit her for observation.  This is a shared visit with the attending physician who personally evaluated the patient and agrees with the care plan.   Case discussed with triad hospitalist Dr. Maudie Mercury who  accepts admission to a telemetry bed for August.       Monico Blitz, PA-C 03/29/14 0129  Jasper Riling. Alvino Chapel, MD 03/29/14 1451

## 2014-03-29 ENCOUNTER — Observation Stay (HOSPITAL_COMMUNITY): Payer: Medicare Other

## 2014-03-29 ENCOUNTER — Encounter (HOSPITAL_COMMUNITY): Payer: Self-pay | Admitting: Internal Medicine

## 2014-03-29 DIAGNOSIS — R079 Chest pain, unspecified: Secondary | ICD-10-CM | POA: Diagnosis not present

## 2014-03-29 DIAGNOSIS — E785 Hyperlipidemia, unspecified: Secondary | ICD-10-CM

## 2014-03-29 DIAGNOSIS — R053 Chronic cough: Secondary | ICD-10-CM | POA: Diagnosis present

## 2014-03-29 DIAGNOSIS — I10 Essential (primary) hypertension: Secondary | ICD-10-CM

## 2014-03-29 DIAGNOSIS — J069 Acute upper respiratory infection, unspecified: Secondary | ICD-10-CM

## 2014-03-29 DIAGNOSIS — I369 Nonrheumatic tricuspid valve disorder, unspecified: Secondary | ICD-10-CM | POA: Diagnosis not present

## 2014-03-29 DIAGNOSIS — E119 Type 2 diabetes mellitus without complications: Secondary | ICD-10-CM

## 2014-03-29 DIAGNOSIS — R05 Cough: Secondary | ICD-10-CM

## 2014-03-29 DIAGNOSIS — R059 Cough, unspecified: Secondary | ICD-10-CM | POA: Diagnosis present

## 2014-03-29 LAB — LIPID PANEL
CHOL/HDL RATIO: 4.4 ratio
Cholesterol: 203 mg/dL — ABNORMAL HIGH (ref 0–200)
HDL: 46 mg/dL (ref >39)
LDL CALC: 147 mg/dL — AB (ref 0–99)
TRIGLYCERIDES: 50 mg/dL (ref <150)
VLDL: 10 mg/dL (ref 0–40)

## 2014-03-29 LAB — GLUCOSE, CAPILLARY
Glucose-Capillary: 104 mg/dL — ABNORMAL HIGH (ref 70–99)
Glucose-Capillary: 131 mg/dL — ABNORMAL HIGH (ref 70–99)
Glucose-Capillary: 145 mg/dL — ABNORMAL HIGH (ref 70–99)
Glucose-Capillary: 145 mg/dL — ABNORMAL HIGH (ref 70–99)
Glucose-Capillary: 87 mg/dL (ref 70–99)

## 2014-03-29 LAB — CREATININE, SERUM
CREATININE: 0.77 mg/dL (ref 0.50–1.10)
GFR calc Af Amer: >90 mL/min (ref >90)
GFR calc non Af Amer: 85 mL/min — ABNORMAL LOW (ref >90)

## 2014-03-29 LAB — HEMOGLOBIN A1C
Hgb A1c MFr Bld: 6.2 % — ABNORMAL HIGH (ref <5.7)
MEAN PLASMA GLUCOSE: 131 mg/dL — AB (ref <117)

## 2014-03-29 LAB — TROPONIN I
Troponin I: <0.03 ng/mL (ref <0.031)
Troponin I: <0.03 ng/mL (ref <0.031)

## 2014-03-29 LAB — CBC
HEMATOCRIT: 35.4 % — AB (ref 36.0–46.0)
Hemoglobin: 11.3 g/dL — ABNORMAL LOW (ref 12.0–15.0)
MCH: 27.3 pg (ref 26.0–34.0)
MCHC: 31.9 g/dL (ref 30.0–36.0)
MCV: 85.5 fL (ref 78.0–100.0)
Platelets: 204 10*3/uL (ref 150–400)
RBC: 4.14 MIL/uL (ref 3.87–5.11)
RDW: 15.8 % — ABNORMAL HIGH (ref 11.5–15.5)
WBC: 6.9 10*3/uL (ref 4.0–10.5)

## 2014-03-29 LAB — INFLUENZA PANEL BY PCR (TYPE A & B)
H1N1 flu by pcr: NOT DETECTED
Influenza A By PCR: NEGATIVE
Influenza B By PCR: NEGATIVE

## 2014-03-29 LAB — BRAIN NATRIURETIC PEPTIDE: B Natriuretic Peptide: 53.2 pg/mL (ref 0.0–100.0)

## 2014-03-29 MED ORDER — GUAIFENESIN-CODEINE 100-10 MG/5ML PO SOLN
5.0000 mL | Freq: Three times a day (TID) | ORAL | Status: DC
  Administered 2014-03-29 (×2): 5 mL via ORAL
  Filled 2014-03-29 (×2): qty 5

## 2014-03-29 MED ORDER — SODIUM CHLORIDE 0.9 % IV SOLN: 250.0000 mL | INTRAVENOUS | Status: DC | PRN

## 2014-03-29 MED ORDER — GUAIFENESIN-CODEINE 100-10 MG/5ML PO SOLN
10.0000 mL | Freq: Four times a day (QID) | ORAL | Status: DC | PRN
  Administered 2014-03-30 (×2): 10 mL via ORAL
  Filled 2014-03-29 (×2): qty 10

## 2014-03-29 MED ORDER — ENOXAPARIN SODIUM 40 MG/0.4ML ~~LOC~~ SOLN
40.0000 mg | SUBCUTANEOUS | Status: DC
  Administered 2014-03-29 – 2014-03-31 (×3): 40 mg via SUBCUTANEOUS
  Filled 2014-03-29 (×3): qty 0.4

## 2014-03-29 MED ORDER — ALBUTEROL SULFATE (2.5 MG/3ML) 0.083% IN NEBU: 2.5000 mg | INHALATION_SOLUTION | RESPIRATORY_TRACT | Status: DC | PRN

## 2014-03-29 MED ORDER — METHYLPREDNISOLONE SODIUM SUCC 125 MG IJ SOLR
60.0000 mg | INTRAMUSCULAR | Status: DC
  Administered 2014-03-29 – 2014-03-30 (×2): 60 mg via INTRAVENOUS
  Filled 2014-03-29 (×2): qty 2

## 2014-03-29 MED ORDER — REGADENOSON 0.4 MG/5ML IV SOLN
INTRAVENOUS | Status: AC
  Filled 2014-03-29: qty 5

## 2014-03-29 MED ORDER — INSULIN ASPART 100 UNIT/ML ~~LOC~~ SOLN
0.0000 [IU] | Freq: Three times a day (TID) | SUBCUTANEOUS | Status: DC
  Administered 2014-03-29 – 2014-03-30 (×4): 1 [IU] via SUBCUTANEOUS

## 2014-03-29 MED ORDER — PROMETHAZINE HCL 25 MG/ML IJ SOLN
12.5000 mg | Freq: Four times a day (QID) | INTRAMUSCULAR | Status: DC | PRN
  Administered 2014-03-29: 12.5 mg via INTRAVENOUS
  Filled 2014-03-29: qty 1

## 2014-03-29 MED ORDER — SODIUM CHLORIDE 0.9 % IJ SOLN: 3.0000 mL | INTRAMUSCULAR | Status: DC | PRN

## 2014-03-29 MED ORDER — ASPIRIN EC 81 MG PO TBEC
81.0000 mg | DELAYED_RELEASE_TABLET | Freq: Every day | ORAL | Status: DC
  Administered 2014-03-29 – 2014-03-31 (×3): 81 mg via ORAL
  Filled 2014-03-29 (×3): qty 1

## 2014-03-29 MED ORDER — TECHNETIUM TC 99M SESTAMIBI GENERIC - CARDIOLITE
10.0000 | Freq: Once | INTRAVENOUS | Status: AC | PRN
  Administered 2014-03-29: 10 via INTRAVENOUS

## 2014-03-29 MED ORDER — LINAGLIPTIN 5 MG PO TABS
5.0000 mg | ORAL_TABLET | Freq: Every day | ORAL | Status: DC
  Administered 2014-03-29 – 2014-03-31 (×3): 5 mg via ORAL
  Filled 2014-03-29 (×3): qty 1

## 2014-03-29 MED ORDER — REGADENOSON 0.4 MG/5ML IV SOLN
0.4000 mg | Freq: Once | INTRAVENOUS | Status: AC
  Administered 2014-03-29: 0.4 mg via INTRAVENOUS
  Filled 2014-03-29: qty 5

## 2014-03-29 MED ORDER — CARVEDILOL 6.25 MG PO TABS
6.2500 mg | ORAL_TABLET | Freq: Two times a day (BID) | ORAL | Status: DC
  Administered 2014-03-29 – 2014-03-31 (×5): 6.25 mg via ORAL
  Filled 2014-03-29 (×5): qty 1

## 2014-03-29 MED ORDER — SODIUM CHLORIDE 0.9 % IJ SOLN: 3.0000 mL | Freq: Two times a day (BID) | INTRAMUSCULAR | Status: DC

## 2014-03-29 MED ORDER — AMLODIPINE BESYLATE 10 MG PO TABS
10.0000 mg | ORAL_TABLET | Freq: Every morning | ORAL | Status: DC
  Administered 2014-03-29 – 2014-03-31 (×3): 10 mg via ORAL
  Filled 2014-03-29 (×3): qty 1

## 2014-03-29 MED ORDER — ALBUTEROL SULFATE HFA 108 (90 BASE) MCG/ACT IN AERS: 1.0000 | INHALATION_SPRAY | Freq: Four times a day (QID) | RESPIRATORY_TRACT | Status: DC | PRN

## 2014-03-29 MED ORDER — IPRATROPIUM-ALBUTEROL 0.5-2.5 (3) MG/3ML IN SOLN
3.0000 mL | Freq: Four times a day (QID) | RESPIRATORY_TRACT | Status: DC
  Administered 2014-03-29 – 2014-03-31 (×8): 3 mL via RESPIRATORY_TRACT
  Filled 2014-03-29 (×8): qty 3

## 2014-03-29 MED ORDER — CARVEDILOL 3.125 MG PO TABS: 3.1250 mg | ORAL_TABLET | Freq: Two times a day (BID) | ORAL | Status: DC

## 2014-03-29 MED ORDER — ATORVASTATIN CALCIUM 80 MG PO TABS
80.0000 mg | ORAL_TABLET | Freq: Every day | ORAL | Status: DC
  Administered 2014-03-29 – 2014-03-30 (×2): 80 mg via ORAL
  Filled 2014-03-29 (×2): qty 1

## 2014-03-29 MED ORDER — INFLUENZA VAC SPLIT QUAD 0.5 ML IM SUSY
0.5000 mL | PREFILLED_SYRINGE | INTRAMUSCULAR | Status: AC
  Administered 2014-03-31: 0.5 mL via INTRAMUSCULAR
  Filled 2014-03-29: qty 0.5

## 2014-03-29 MED ORDER — INSULIN ASPART 100 UNIT/ML ~~LOC~~ SOLN
0.0000 [IU] | Freq: Every day | SUBCUTANEOUS | Status: DC
Start: 2014-03-29 — End: 2014-03-31

## 2014-03-29 MED ORDER — HYDROCOD POLST-CHLORPHEN POLST 10-8 MG/5ML PO LQCR
5.0000 mL | Freq: Two times a day (BID) | ORAL | Status: DC | PRN
  Administered 2014-03-29: 5 mL via ORAL
  Filled 2014-03-29: qty 5

## 2014-03-29 MED ORDER — TAMSULOSIN HCL 0.4 MG PO CAPS
0.4000 mg | ORAL_CAPSULE | Freq: Every day | ORAL | Status: DC
  Administered 2014-03-29 – 2014-03-31 (×3): 0.4 mg via ORAL
  Filled 2014-03-29 (×3): qty 1

## 2014-03-29 MED ORDER — ACETAMINOPHEN 325 MG PO TABS
650.0000 mg | ORAL_TABLET | Freq: Four times a day (QID) | ORAL | Status: DC | PRN
  Administered 2014-03-29 – 2014-03-31 (×7): 650 mg via ORAL
  Filled 2014-03-29 (×7): qty 2

## 2014-03-29 MED ORDER — PNEUMOCOCCAL VAC POLYVALENT 25 MCG/0.5ML IJ INJ
0.5000 mL | INJECTION | INTRAMUSCULAR | Status: AC
  Administered 2014-03-31: 0.5 mL via INTRAMUSCULAR
  Filled 2014-03-29: qty 0.5

## 2014-03-29 MED ORDER — LISINOPRIL 5 MG PO TABS
5.0000 mg | ORAL_TABLET | Freq: Every day | ORAL | Status: DC
  Administered 2014-03-29 – 2014-03-31 (×3): 5 mg via ORAL
  Filled 2014-03-29 (×3): qty 1

## 2014-03-29 MED ORDER — GUAIFENESIN-CODEINE 100-10 MG/5ML PO SOLN: 5.0000 mL | Freq: Three times a day (TID) | ORAL | Status: DC | PRN

## 2014-03-29 MED ORDER — ACETAMINOPHEN 650 MG RE SUPP: 650.0000 mg | Freq: Four times a day (QID) | RECTAL | Status: DC | PRN

## 2014-03-29 MED ORDER — PANTOPRAZOLE SODIUM 40 MG PO TBEC
40.0000 mg | DELAYED_RELEASE_TABLET | Freq: Every day | ORAL | Status: DC
  Administered 2014-03-29 – 2014-03-31 (×3): 40 mg via ORAL
  Filled 2014-03-29 (×3): qty 1

## 2014-03-29 MED ORDER — TECHNETIUM TC 99M SESTAMIBI GENERIC - CARDIOLITE
30.0000 | Freq: Once | INTRAVENOUS | Status: AC | PRN
  Administered 2014-03-29: 30 via INTRAVENOUS

## 2014-03-29 NOTE — Progress Notes (Signed)
[facility] PROGRESS NOTE  Brenda Flores LZJ:[id number] DOB: 06/07/1945 DOA: 03/28/2014 PCP: [name], MD  Assessment/Plan: Chest pain -Troponin 3 negative  -Chest pain most likely secondary to upper respiratory tract infection with refractory cough - Nuclear stress; No acute ST or TW changes on ECG -Continue Start on aspirin 325mg  daily  Viral Upper respiratory tract infection/Refractory cough with Dyspnea -Patient most likely suffering from upper respiratory tract infection viral in nature -DuoNeb QID -Sorry Medrol 60 mg daily; will give patient 2-3 doses for inflammation. -Guaifenesin -Codeine 10 mg QID -cardiac echo; grade 1 diastolic dysfunction, nondiagnostic for her chest pain Check bnp Please consider PFT with lung volume and DLCO as outpatient  Nausea -Phenergan for 12.5 mg Q 6hr PRN   Gerd ppi  Hypertension -Continue amlodipine 10 mg daily -Continue Coreg 6.25 mg BID -Start lisinopril 5 mg daily  Diabetes type 2  -Continue Tradjenta 5mg  daily -Obtain hemoglobin A1c  HLD -Not within ADA guidelines -Continue Lipitor 80 mg daily -  Code Status: Full Family Communication: None Disposition Plan: Resolution of a respiratory tract infection   Consultants: None   Procedures: 1/9 echocardiogram;LVEF = 65%-70%.  -(grade 1 diastolicdysfunction).  1/9 nuclear stress test; No acute ST or TW changes on ECG  Cultures Influenza panel pending  Antibiotics: None   HPI/Subjective: 69 yo BF PMHx HTN, HLD, mitral valve prolapse, CAD, left nephrolithiasis, diabetes mellitus type 2  States starting on Thursday positive SOB/CP/headache, measured fever at 101 on Wednesday, positive productive cough clear sputum.  Arrived in ED C/o chest pain starting yesterday afternoon, left sided with radiation to the left arm. Pt notes that the pain got worse at about 7:30 pm and therefore decided to come to the ER. Nitro seemed to help some. After 45  minutes. Pt notes slight dyspnea, and has had cough (nonproductive) since 10/2013.  1/9 currently positive CP (most likely secondary to refractory cough), positive SOB, positive headache.    Objective: Filed Vitals:   03/29/14 0913 03/29/14 0950 03/29/14 0952 03/29/14 0954  BP: 180/99 158/84 169/91 163/85  Pulse:      Temp:      TempSrc:      Resp:      Height:      Weight:      SpO2:        Intake/Output Summary (Last 24 hours) at 03/29/14 1137 Last data filed at 03/29/14 0800  Gross per 24 hour  Intake      0 ml  Output      0 ml  Net      0 ml   Filed Weights   03/29/14 0045 03/29/14 0500  Weight: 81.602 kg (179 lb 14.4 oz) 81.194 kg (179 lb)     Exam: General: A/O 4, moderate respiratory distress secondary to refractory cough  Lungs: Mild diffuse expiratory wheezes  Cardiovascular: Regular rate and rhythm without murmur gallop or rub normal S1 and S2 Abdomen: Nontender, nondistended, soft, bowel sounds positive, no rebound, no ascites, no appreciable mass Extremities: No significant cyanosis, clubbing, or edema bilateral lower extremities   Data Reviewed: Basic Metabolic Panel:  Recent Labs Lab 03/28/14 2125 03/29/14 0352  NA 142  --   K 4.1  --   CL 108  --   CO2 27  --   GLUCOSE 138*  --   BUN 17  --   CREATININE 0.94 0.77  CALCIUM 9.3  --    Liver Function Tests:  Recent Labs Lab 03/28/14 2125  AST 33  ALT 27  ALKPHOS 74  BILITOT 0.4  PROT 6.9  ALBUMIN 3.5   No results for input(s): LIPASE, AMYLASE in the last 168 hours. No results for input(s): AMMONIA in the last 168 hours. CBC:  Recent Labs Lab 03/28/14 2125 03/29/14 0352  WBC 7.7 6.9  HGB 13.2 11.3*  HCT 39.7 35.4*  MCV 86.5 85.5  PLT 233 204   Cardiac Enzymes:  Recent Labs Lab 03/29/14 0352 03/29/14 0740  TROPONINI <0.03 <0.03   BNP (last 3 results) No results for input(s): PROBNP in the last 8760 hours. CBG:  Recent Labs Lab 03/29/14 0053 03/29/14 0732   GLUCAP 104* 87    No results found for this or any previous visit (from the past 240 hour(s)).   Studies: Dg Chest 2 View  03/28/2014   CLINICAL DATA:  Chest pain and short of breath  EXAM: CHEST  2 VIEW  COMPARISON:  CT 10/15/2013  FINDINGS: Normal cardiac silhouette. There is cysts within the liver which tenth the diaphragm and unchanged from prior. No effusion, infiltrate, or pneumothorax. Degenerative osteophytosis of the thoracic spine.  IMPRESSION: No acute cardiopulmonary process.   Electronically Signed   By: Suzy Bouchard M.D.   On: 03/28/2014 23:16   Nm Myocar Multi W/spect W/wall Motion / Ef  03/29/2014   CLINICAL DATA:  Chest pain  EXAM: MYOCARDIAL IMAGING WITH SPECT (REST AND PHARMACOLOGIC-STRESS)  GATED LEFT VENTRICULAR WALL MOTION STUDY  LEFT VENTRICULAR EJECTION FRACTION  TECHNIQUE: Standard myocardial SPECT imaging was performed after resting intravenous injection of 10 mCi Tc-61m sestamibi. Subsequently, intravenous infusion of Lexiscan was performed under the supervision of the Cardiology staff. At peak effect of the drug, 30 mCi Tc-28m sestamibi was injected intravenously and standard myocardial SPECT imaging was performed. Quantitative gated imaging was also performed to evaluate left ventricular wall motion, and estimate left ventricular ejection fraction.  COMPARISON:  None.  FINDINGS: Perfusion: No decreased activity in the left ventricle on stress imaging to suggest reversible ischemia or infarction.  Wall Motion: Normal left ventricular wall motion. No left ventricular dilation.  Left Ventricular Ejection Fraction: 68 %  End diastolic volume 74 ml  End systolic volume 24 ml  IMPRESSION: 1. No reversible ischemia or infarction.  2. Normal left ventricular wall motion.  3. Left ventricular ejection fraction 68%  4. Low-risk stress test findings*.  *2012 Appropriate Use Criteria for Coronary Revascularization Focused Update: J Am Coll Cardiol. 0630;16(0):109-323.  http://content.[email or web address].aspx?articleid=[email or web address]   Electronically Signed   By: Julian Hy M.D.   On: 03/29/2014 11:31    Scheduled Meds: . amLODipine  10 mg Oral q morning - 10a  . aspirin EC  81 mg Oral Daily  . atorvastatin  80 mg Oral q1800  . carvedilol  3.125 mg Oral BID WC  . enoxaparin (LOVENOX) injection  40 mg Subcutaneous Q24H  . insulin aspart  0-5 Units Subcutaneous QHS  . insulin aspart  0-9 Units Subcutaneous TID WC  . linagliptin  5 mg Oral Daily  . pantoprazole  40 mg Oral Daily  . sodium chloride  3 mL Intravenous Q12H  . sodium chloride  3 mL Intravenous Q12H  . tamsulosin  0.4 mg Oral Daily   Continuous Infusions: . sodium chloride 1,000 mL (03/29/14 1110)    Active Problems:   Hypertension   Chest pain    Time spent: 45 minutes    Rosland Riding, J  [facility] Pager [phone]. If 7PM-7AM, please contact night-coverage at [email or web address], password  [email or web address] 03/29/2014, 11:37 AM  LOS: 1 day

## 2014-03-29 NOTE — Progress Notes (Signed)
     The patient was seen in nuclear medicine for a nuclear myoview. She tolerated the procedure well. No acute ST or TW changes on ECG.    Perry Mount PA-C  [facility]

## 2014-03-29 NOTE — Progress Notes (Signed)
UR completed 

## 2014-03-29 NOTE — H&P (Addendum)
Brenda Flores is an 69 y.o. female.    Brenda Flores (pcp)  Chief Complaint: chest pain HPI: 69 yo female with cad, mvp?,  C/o chest pain starting yesterday afternoon, left sided with radiation to the left arm.  Pt notes that the pain got worse at about 7:30 pm and therefore decided to come to the ER.  Nitro seemed to help some.  After 45 minutes.  Pt notes slight dyspnea, and has had cough (nonproductive) since 10/2013.  Pt denies fever, chills, n/v, diarrhea, abd pain, brbpr, black stool.    Past Medical History  Diagnosis Date  . Osteoporosis   . CAD (coronary artery disease) CARDIOLOGIST-  DR Cristopher Peru--  LOV  2013    non-obs CAD by cath in 7/07 (mLAD 30-40%, EF 65%)  . History of rectal polyps     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  . Hypertension   . Hyperlipidemia   . History of mitral valve prolapse     PER PT REPORTED MVP  PER DR HARSHAW FROM CARDIAC CATH IN 1987 (APPROX)  . GERD (gastroesophageal reflux disease)   . Liver, polycystic   . Right ureteral stone   . Left nephrolithiasis     NON-OBSTRUCTIVE  . Lumbar radiculopathy   . DJD (degenerative joint disease)     KNEES  . DDD (degenerative disc disease), lumbar   . Type 2 diabetes mellitus     Past Surgical History  Procedure Laterality Date  . Heel spur surgery Left 2003  . Cardiac catheterization  10-17-2005   DR COOPER    NON-OBSTRUCTIVE CAD/   mLAD 30-40%/   NORMAL LVF/  EF 65%  . Transthoracic echocardiogram  04-07-2011    MILD LVH/  MILD FOCAL BASAL SEPTAL HYPERTROPHY/  EF  55-60% /  GRADE I DIASTOLIC DYSFUNCTION  . Cardiovascular stress test  04-12-2011  DR Carleene Overlie TAYLOR    LOW RISK STUDY/  NO ISCHEMIA/  EF 68%  . Cataract extraction w/ intraocular lens implant Right 2003  . Total abdominal hysterectomy w/ bilateral salpingoophorectomy  1978  . Colonoscopy w/ polypectomy  03-17-2011  . Cystoscopy with ureteroscopy Right 06/14/2013    Procedure: RIGHT   URETEROSCOPY,ureteral and urethral dilitation;  Surgeon: Claybon Jabs, MD;  Location: [facility];  Service: Urology;  Laterality: Right;    Family History  Problem Relation Age of Onset  . Cancer Mother     breast  . Cancer Father     colon  . Cancer Brother     ?lung   Social History:  reports that she has never smoked. She has never used smokeless tobacco. She reports that she does not drink alcohol or use illicit drugs.  Allergies:  Allergies  Allergen Reactions  . Darvocet [Propoxyphene N-Acetaminophen] Nausea And Vomiting  . Zofran Itching    Medications Prior to Admission  Medication Sig Dispense Refill  . albuterol (PROVENTIL HFA;VENTOLIN HFA) 108 (90 BASE) MCG/ACT inhaler Inhale 1-2 puffs into the lungs every 6 (six) hours as needed for wheezing or shortness of breath. 1 Inhaler 0  . amLODipine (NORVASC) 10 MG tablet Take 10 mg by mouth every morning.    . chlorpheniramine-HYDROcodone (TUSSIONEX PENNKINETIC ER) 10-8 MG/5ML LQCR Take 5 mLs by mouth every 12 (twelve) hours as needed for cough. 115 mL 0  . doxycycline (VIBRAMYCIN) 100 MG capsule Take 1 capsule (100 mg total) by mouth 2 (two) times daily. 20 capsule 0  . esomeprazole (NEXIUM) 40 MG  capsule Take 1 capsule (40 mg total) by mouth 2 (two) times daily. 60 capsule 1  . guaiFENesin-codeine 100-10 MG/5ML syrup Take 5 mLs by mouth 3 (three) times daily as needed for cough. 120 mL 0  . sitaGLIPtin (JANUVIA) 100 MG tablet Take 100 mg by mouth daily.    . tamsulosin (FLOMAX) 0.4 MG CAPS capsule Take 1 capsule (0.4 mg total) by mouth daily. 14 capsule 0  . ipratropium (ATROVENT) 0.06 % nasal spray Place 2 sprays into both nostrils 4 (four) times daily. (Patient not taking: Reported on 03/28/2014) 15 mL 1    Results for orders placed or performed during the hospital encounter of 03/28/14 (from the past 48 hour(s))  Comprehensive metabolic panel     Status: Abnormal   Collection Time: 03/28/14  9:25 PM   Result Value Ref Range   Sodium 142 135 - 145 mmol/L    Comment: Please note change in reference range.   Potassium 4.1 3.5 - 5.1 mmol/L    Comment: Please note change in reference range.   Chloride 108 96 - 112 mEq/L   CO2 27 19 - 32 mmol/L   Glucose, Bld 138 (H) 70 - 99 mg/dL   BUN 17 6 - 23 mg/dL   Creatinine, Ser 0.94 0.50 - 1.10 mg/dL   Calcium 9.3 8.4 - 10.5 mg/dL   Total Protein 6.9 6.0 - 8.3 g/dL   Albumin 3.5 3.5 - 5.2 g/dL   AST 33 0 - 37 U/L   ALT 27 0 - 35 U/L   Alkaline Phosphatase 74 39 - 117 U/L   Total Bilirubin 0.4 0.3 - 1.2 mg/dL   GFR calc non Af Amer 61 (L) >90 mL/min   GFR calc Af Amer 71 (L) >90 mL/min    Comment: (NOTE) The eGFR has been calculated using the CKD EPI equation. This calculation has not been validated in all clinical situations. eGFR's persistently <90 mL/min signify possible Chronic Kidney Disease.    Anion gap 7 5 - 15  CBC     Status: Abnormal   Collection Time: 03/28/14  9:25 PM  Result Value Ref Range   WBC 7.7 4.0 - 10.5 K/uL    Comment: WHITE COUNT CONFIRMED ON SMEAR   RBC 4.59 3.87 - 5.11 MIL/uL   Hemoglobin 13.2 12.0 - 15.0 g/dL   HCT 39.7 36.0 - 46.0 %   MCV 86.5 78.0 - 100.0 fL   MCH 28.8 26.0 - 34.0 pg   MCHC 33.2 30.0 - 36.0 g/dL   RDW 15.6 (H) 11.5 - 15.5 %   Platelets 233 150 - 400 K/uL  I-stat troponin, ED (not at [facility])     Status: None   Collection Time: 03/28/14  9:34 PM  Result Value Ref Range   Troponin i, poc 0.00 0.00 - 0.08 ng/mL   Comment 3            Comment: Due to the release kinetics of cTnI, a negative result within the first hours of the onset of symptoms does not rule out myocardial infarction with certainty. If myocardial infarction is still suspected, repeat the test at appropriate intervals.   Glucose, capillary     Status: Abnormal   Collection Time: 03/29/14 12:53 AM  Result Value Ref Range   Glucose-Capillary 104 (H) 70 - 99 mg/dL   Comment 1 Notify RN    Dg Chest 2 View  03/28/2014    CLINICAL DATA:  Chest pain and short of breath  EXAM:  CHEST  2 VIEW  COMPARISON:  CT 10/15/2013  FINDINGS: Normal cardiac silhouette. There is cysts within the liver which tenth the diaphragm and unchanged from prior. No effusion, infiltrate, or pneumothorax. Degenerative osteophytosis of the thoracic spine.  IMPRESSION: No acute cardiopulmonary process.   Electronically Signed   By: Suzy Bouchard M.D.   On: 03/28/2014 23:16    Review of Systems  Constitutional: Negative for fever, chills, weight loss, malaise/fatigue and diaphoresis.  HENT: Negative for congestion, ear discharge, ear pain, hearing loss, nosebleeds, sore throat and tinnitus.   Eyes: Negative for blurred vision, double vision, photophobia, pain, discharge and redness.  Respiratory: Positive for cough and shortness of breath. Negative for hemoptysis, sputum production, wheezing and stridor.   Cardiovascular: Positive for chest pain. Negative for palpitations, orthopnea, claudication, leg swelling and PND.  Gastrointestinal: Positive for heartburn. Negative for nausea, vomiting, abdominal pain, diarrhea, constipation, blood in stool and melena.  Genitourinary: Negative for dysuria, urgency, frequency, hematuria and flank pain.  Musculoskeletal: Negative for myalgias, back pain, joint pain, falls and neck pain.  Skin: Negative for itching and rash.  Neurological: Negative for dizziness, tingling, tremors, sensory change, speech change, focal weakness, seizures, loss of consciousness, weakness and headaches.  Endo/Heme/Allergies: Negative for environmental allergies and polydipsia. Does not bruise/bleed easily.  Psychiatric/Behavioral: Negative for depression, suicidal ideas, hallucinations, memory loss and substance abuse. The patient is not nervous/anxious and does not have insomnia.     Blood pressure 178/79, pulse 75, temperature 98.2 F (36.8 C), temperature source Oral, resp. rate 20, height 5' 5"  (1.651 m), weight 81.602 kg  (179 lb 14.4 oz), SpO2 100 %. Physical Exam  Constitutional: She is oriented to person, place, and time. She appears well-developed and well-nourished.  HENT:  Head: Normocephalic and atraumatic.  Eyes: Conjunctivae and EOM are normal. Pupils are equal, round, and reactive to light. No scleral icterus.  Neck: Normal range of motion. Neck supple. No JVD present. No tracheal deviation present. No thyromegaly present.  Cardiovascular: Normal rate and regular rhythm.  Exam reveals no gallop and no friction rub.   No murmur heard. Respiratory: Effort normal and breath sounds normal. No respiratory distress. She has no wheezes. She has no rales.  GI: Soft. Bowel sounds are normal. She exhibits no distension. There is no tenderness. There is no rebound and no guarding.  Musculoskeletal: Normal range of motion. She exhibits no edema or tenderness.  Lymphadenopathy:    She has no cervical adenopathy.  Neurological: She is alert and oriented to person, place, and time. She has normal reflexes. She displays normal reflexes. No cranial nerve deficit. She exhibits normal muscle tone. Coordination normal.  Skin: Skin is warm and dry. No rash noted. No erythema. No pallor.  Psychiatric: She has a normal mood and affect. Her behavior is normal. Judgment and thought content normal.     Assessment/Plan Chest pain Telemetry Cycle cardiac markers NPO after mn,   Nuclear stress testing in am Start on aspirin 353m po qday, Lipitor 881mpo qhs, carvedilol 3.12575mo bid  Dyspnea Check cardiac echo Check bnp Please consider PFT with lung volume and DLCO as outpatient  Gerd ppi  Hypertension Continue current medication  Dm2 tradjenta 5mg16mq day, iss,       Aldin Drees 03/29/2014, 1:12 AM

## 2014-03-29 NOTE — Progress Notes (Signed)
  Echocardiogram 2D Echocardiogram has been performed.  Brenda Flores 03/29/2014, 1:47 PM

## 2014-03-30 DIAGNOSIS — E785 Hyperlipidemia, unspecified: Secondary | ICD-10-CM | POA: Diagnosis not present

## 2014-03-30 DIAGNOSIS — J069 Acute upper respiratory infection, unspecified: Secondary | ICD-10-CM | POA: Diagnosis not present

## 2014-03-30 DIAGNOSIS — E119 Type 2 diabetes mellitus without complications: Secondary | ICD-10-CM | POA: Diagnosis not present

## 2014-03-30 DIAGNOSIS — R05 Cough: Secondary | ICD-10-CM | POA: Diagnosis not present

## 2014-03-30 DIAGNOSIS — I10 Essential (primary) hypertension: Secondary | ICD-10-CM | POA: Diagnosis not present

## 2014-03-30 DIAGNOSIS — R079 Chest pain, unspecified: Secondary | ICD-10-CM | POA: Diagnosis not present

## 2014-03-30 LAB — CBC WITH DIFFERENTIAL/PLATELET
BASOS PCT: 0 % (ref 0–1)
Basophils Absolute: 0 10*3/uL (ref 0.0–0.1)
Eosinophils Absolute: 0 10*3/uL (ref 0.0–0.7)
Eosinophils Relative: 0 % (ref 0–5)
HCT: 36.9 % (ref 36.0–46.0)
Hemoglobin: 11.9 g/dL — ABNORMAL LOW (ref 12.0–15.0)
LYMPHS PCT: 25 % (ref 12–46)
Lymphs Abs: 2.6 10*3/uL (ref 0.7–4.0)
MCH: 27.5 pg (ref 26.0–34.0)
MCHC: 32.2 g/dL (ref 30.0–36.0)
MCV: 85.4 fL (ref 78.0–100.0)
Monocytes Absolute: 0.9 10*3/uL (ref 0.1–1.0)
Monocytes Relative: 8 % (ref 3–12)
NEUTROS ABS: 6.7 10*3/uL (ref 1.7–7.7)
NEUTROS PCT: 67 % (ref 43–77)
PLATELETS: 193 10*3/uL (ref 150–400)
RBC: 4.32 MIL/uL (ref 3.87–5.11)
RDW: 15.9 % — AB (ref 11.5–15.5)
WBC: 10.2 10*3/uL (ref 4.0–10.5)

## 2014-03-30 LAB — COMPREHENSIVE METABOLIC PANEL
ALBUMIN: 3 g/dL — AB (ref 3.5–5.2)
ALK PHOS: 61 U/L (ref 39–117)
ALT: 21 U/L (ref 0–35)
AST: 23 U/L (ref 0–37)
Anion gap: 5 (ref 5–15)
BILIRUBIN TOTAL: 0.3 mg/dL (ref 0.3–1.2)
BUN: 20 mg/dL (ref 6–23)
CHLORIDE: 112 meq/L (ref 96–112)
CO2: 24 mmol/L (ref 19–32)
Calcium: 8.6 mg/dL (ref 8.4–10.5)
Creatinine, Ser: 0.79 mg/dL (ref 0.50–1.10)
GFR calc non Af Amer: 84 mL/min — ABNORMAL LOW (ref >90)
Glucose, Bld: 121 mg/dL — ABNORMAL HIGH (ref 70–99)
Potassium: 3.7 mmol/L (ref 3.5–5.1)
Sodium: 141 mmol/L (ref 135–145)
Total Protein: 5.9 g/dL — ABNORMAL LOW (ref 6.0–8.3)

## 2014-03-30 LAB — GLUCOSE, CAPILLARY
GLUCOSE-CAPILLARY: 99 mg/dL (ref 70–99)
GLUCOSE-CAPILLARY: 124 mg/dL — AB (ref 70–99)
Glucose-Capillary: 142 mg/dL — ABNORMAL HIGH (ref 70–99)
Glucose-Capillary: 142 mg/dL — ABNORMAL HIGH (ref 70–99)

## 2014-03-30 LAB — MAGNESIUM: MAGNESIUM: 2.1 mg/dL (ref 1.5–2.5)

## 2014-03-30 MED ORDER — BUDESONIDE 0.25 MG/2ML IN SUSP: 0.2500 mg | Freq: Two times a day (BID) | RESPIRATORY_TRACT | Status: DC

## 2014-03-30 MED ORDER — BUDESONIDE 0.25 MG/2ML IN SUSP
0.2500 mg | Freq: Two times a day (BID) | RESPIRATORY_TRACT | Status: DC
  Administered 2014-03-30 – 2014-03-31 (×2): 0.25 mg via RESPIRATORY_TRACT
  Filled 2014-03-30 (×2): qty 2

## 2014-03-30 MED ORDER — GUAIFENESIN-CODEINE 100-10 MG/5ML PO SOLN
10.0000 mL | ORAL | Status: DC | PRN
  Administered 2014-03-30 – 2014-03-31 (×3): 10 mL via ORAL
  Filled 2014-03-30 (×4): qty 10

## 2014-03-30 MED ORDER — BUTAMBEN-TETRACAINE-BENZOCAINE 2-2-14 % EX AERO
1.0000 | INHALATION_SPRAY | Freq: Three times a day (TID) | CUTANEOUS | Status: DC
  Administered 2014-03-30 – 2014-03-31 (×2): 1 via TOPICAL
  Filled 2014-03-30: qty 56

## 2014-03-30 NOTE — Progress Notes (Signed)
Utilization Review Completed.   Asianna Brundage, RN, BSN Nurse Case Manager  

## 2014-03-30 NOTE — Progress Notes (Signed)
[facility] PROGRESS NOTE  Brenda Flores XVQ:[id number] DOB: December 28, 1945 DOA: 03/28/2014 PCP: Brenda Greenland, MD  Assessment/Plan: Chest pain -Troponin 3 negative  -Chest pain most likely secondary to upper respiratory tract infection with refractory cough - Nuclear stress; No acute ST or TW changes on ECG -Continue aspirin 325mg  daily  Viral Upper respiratory tract infection/Refractory cough with Dyspnea -Patient most likely suffering from upper respiratory tract infection viral in nature -DuoNeb QID -Sorry Medrol 60 mg daily; will give patient 2-3 doses for inflammation. -Pulmicort nebulizer BID  -Guaifenesin -Codeine 10 mg q 4hr PRN -cardiac echo; grade 1 diastolic dysfunction, nondiagnostic for her chest pain -Please consider PFT with lung volume and DLCO as outpatient  Nausea -Phenergan for 12.5 mg Q 6hr PRN   Gerd ppi  Hypertension -Continue amlodipine 10 mg daily -Continue Coreg 6.25 mg BID -Continue lisinopril 5 mg daily  Diabetes type 2 controlled -Continue Tradjenta 5mg  daily -1/9 hemoglobin A1c= 6.2  HLD -Not within ADA guidelines -Continue Lipitor 80 mg daily -     Code Status: Full Family Communication: None Disposition Plan: Resolution of a respiratory tract infection   Consultants: None   Procedures: 1/9 echocardiogram;LVEF = 65%-70%.  -(grade 1 diastolicdysfunction).  1/9 nuclear stress test; No acute ST or TW changes on ECG  Cultures Influenza panel pending  Antibiotics: None   HPI/Subjective: 69 yo BF PMHx HTN, HLD, mitral valve prolapse, CAD, left nephrolithiasis, diabetes mellitus type 2  States starting on Thursday positive SOB/CP/headache, measured fever at 101 on Wednesday, positive productive cough clear sputum.  Arrived in ED C/o chest pain starting yesterday afternoon, left sided with radiation to the left arm. Pt notes that the pain got worse at about 7:30 pm and therefore decided to come to the ER.  Nitro seemed to help some. After 45 minutes. Pt notes slight dyspnea, and has had cough (nonproductive) since 10/2013.  1/9 currently positive CP (most likely secondary to refractory cough), positive SOB, positive headache.    Objective: Filed Vitals:   03/30/14 0125 03/30/14 0500 03/30/14 0900 03/30/14 1425  BP:  122/70 145/66 140/71  Pulse:  62  70  Temp:  98 F (36.7 C)  99.9 F (37.7 C)  TempSrc:    Oral  Resp:  11  20  Height:      Weight:  81.602 kg (179 lb 14.4 oz)    SpO2: 97% 97% 96% 98%    Intake/Output Summary (Last 24 hours) at 03/30/14 1545 Last data filed at 03/30/14 0947  Gross per 24 hour  Intake      0 ml  Output    400 ml  Net   -400 ml   Filed Weights   03/29/14 0045 03/29/14 0500 03/30/14 0500  Weight: 81.602 kg (179 lb 14.4 oz) 81.194 kg (179 lb) 81.602 kg (179 lb 14.4 oz)     Exam: General: A/O 4, moderate respiratory distress secondary to refractory cough  Lungs: Mild diffuse expiratory wheezes  Cardiovascular: Regular rate and rhythm without murmur gallop or rub normal S1 and S2 Abdomen: Nontender, nondistended, soft, bowel sounds positive, no rebound, no ascites, no appreciable mass Extremities: No significant cyanosis, clubbing, or edema bilateral lower extremities   Data Reviewed: Basic Metabolic Panel:  Recent Labs Lab 03/28/14 2125 03/29/14 0352 03/30/14 0823  NA 142  --  141  K 4.1  --  3.7  CL 108  --  112  CO2 27  --  24  GLUCOSE 138*  --  121*  BUN 17  --  20  CREATININE 0.94 0.77 0.79  CALCIUM 9.3  --  8.6  MG  --   --  2.1   Liver Function Tests:  Recent Labs Lab 03/28/14 2125 03/30/14 0823  AST 33 23  ALT 27 21  ALKPHOS 74 61  BILITOT 0.4 0.3  PROT 6.9 5.9*  ALBUMIN 3.5 3.0*   No results for input(s): LIPASE, AMYLASE in the last 168 hours. No results for input(s): AMMONIA in the last 168 hours. CBC:  Recent Labs Lab 03/28/14 2125 03/29/14 0352 03/30/14 0823  WBC 7.7 6.9 10.2  NEUTROABS  --   --   6.7  HGB 13.2 11.3* 11.9*  HCT 39.7 35.4* 36.9  MCV 86.5 85.5 85.4  PLT 233 204 193   Cardiac Enzymes:  Recent Labs Lab 03/29/14 0352 03/29/14 0740 03/29/14 1515  TROPONINI <0.03 <0.03 <0.03   BNP (last 3 results) No results for input(s): PROBNP in the last 8760 hours. CBG:  Recent Labs Lab 03/29/14 1143 03/29/14 1625 03/29/14 2123 03/30/14 0730 03/30/14 1200  GLUCAP 131* 145* 145* 99 124*    No results found for this or any previous visit (from the past 240 hour(s)).   Studies: Dg Chest 2 View  03/28/2014   CLINICAL DATA:  Chest pain and short of breath  EXAM: CHEST  2 VIEW  COMPARISON:  CT 10/15/2013  FINDINGS: Normal cardiac silhouette. There is cysts within the liver which tenth the diaphragm and unchanged from prior. No effusion, infiltrate, or pneumothorax. Degenerative osteophytosis of the thoracic spine.  IMPRESSION: No acute cardiopulmonary process.   Electronically Signed   By: Suzy Bouchard M.D.   On: 03/28/2014 23:16   Nm Myocar Multi W/spect W/wall Motion / Ef  03/29/2014   CLINICAL DATA:  Chest pain  EXAM: MYOCARDIAL IMAGING WITH SPECT (REST AND PHARMACOLOGIC-STRESS)  GATED LEFT VENTRICULAR WALL MOTION STUDY  LEFT VENTRICULAR EJECTION FRACTION  TECHNIQUE: Standard myocardial SPECT imaging was performed after resting intravenous injection of 10 mCi Tc-19m sestamibi. Subsequently, intravenous infusion of Lexiscan was performed under the supervision of the Cardiology staff. At peak effect of the drug, 30 mCi Tc-28m sestamibi was injected intravenously and standard myocardial SPECT imaging was performed. Quantitative gated imaging was also performed to evaluate left ventricular wall motion, and estimate left ventricular ejection fraction.  COMPARISON:  None.  FINDINGS: Perfusion: No decreased activity in the left ventricle on stress imaging to suggest reversible ischemia or infarction.  Wall Motion: Normal left ventricular wall motion. No left ventricular dilation.   Left Ventricular Ejection Fraction: 68 %  End diastolic volume 74 ml  End systolic volume 24 ml  IMPRESSION: 1. No reversible ischemia or infarction.  2. Normal left ventricular wall motion.  3. Left ventricular ejection fraction 68%  4. Low-risk stress test findings*.  *2012 Appropriate Use Criteria for Coronary Revascularization Focused Update: J Am Coll Cardiol. 8502;77(4):128-786. http://content.airportbarriers.com.aspx?articleid=1201161   Electronically Signed   By: Julian Hy M.D.   On: 03/29/2014 11:31    Scheduled Meds: . amLODipine  10 mg Oral q morning - 10a  . aspirin EC  81 mg Oral Daily  . atorvastatin  80 mg Oral q1800  . carvedilol  6.25 mg Oral BID WC  . enoxaparin (LOVENOX) injection  40 mg Subcutaneous Q24H  . Influenza vac split quadrivalent PF  0.5 mL Intramuscular Tomorrow-1000  . insulin aspart  0-5 Units Subcutaneous QHS  . insulin aspart  0-9 Units Subcutaneous TID WC  . ipratropium-albuterol  3 mL Nebulization Q6H  . linagliptin  5 mg Oral Daily  . lisinopril  5 mg Oral Daily  . methylPREDNISolone (SOLU-MEDROL) injection  60 mg Intravenous Q24H  . pantoprazole  40 mg Oral Daily  . pneumococcal 23 valent vaccine  0.5 mL Intramuscular Tomorrow-1000  . sodium chloride  3 mL Intravenous Q12H  . sodium chloride  3 mL Intravenous Q12H  . tamsulosin  0.4 mg Oral Daily   Continuous Infusions: . sodium chloride 100 mL/hr at 03/30/14 7741    Active Problems:   Hypertension   Chest pain   Pain in the chest   Viral upper respiratory tract infection   Cough   Essential hypertension   Diabetes type 2, controlled   HLD (hyperlipidemia)    Time spent: 45 minutes    Maggi Hershkowitz, J  [facility] Pager [phone]. If 7PM-7AM, please contact night-coverage at [email or web address], password [facility] 03/30/2014, 3:45 PM  LOS: 2 days

## 2014-03-31 DIAGNOSIS — R079 Chest pain, unspecified: Secondary | ICD-10-CM | POA: Diagnosis not present

## 2014-03-31 DIAGNOSIS — J069 Acute upper respiratory infection, unspecified: Secondary | ICD-10-CM | POA: Diagnosis not present

## 2014-03-31 DIAGNOSIS — E119 Type 2 diabetes mellitus without complications: Secondary | ICD-10-CM | POA: Diagnosis not present

## 2014-03-31 DIAGNOSIS — I10 Essential (primary) hypertension: Secondary | ICD-10-CM | POA: Diagnosis not present

## 2014-03-31 LAB — CBC WITH DIFFERENTIAL/PLATELET
Basophils Absolute: 0 10*3/uL (ref 0.0–0.1)
Basophils Relative: 0 % (ref 0–1)
EOS ABS: 0 10*3/uL (ref 0.0–0.7)
EOS PCT: 0 % (ref 0–5)
HCT: 34.5 % — ABNORMAL LOW (ref 36.0–46.0)
Hemoglobin: 11.2 g/dL — ABNORMAL LOW (ref 12.0–15.0)
LYMPHS PCT: 19 % (ref 12–46)
Lymphs Abs: 1.8 10*3/uL (ref 0.7–4.0)
MCH: 27.8 pg (ref 26.0–34.0)
MCHC: 32.5 g/dL (ref 30.0–36.0)
MCV: 85.6 fL (ref 78.0–100.0)
MONOS PCT: 7 % (ref 3–12)
Monocytes Absolute: 0.7 10*3/uL (ref 0.1–1.0)
NEUTROS ABS: 6.7 10*3/uL (ref 1.7–7.7)
Neutrophils Relative %: 74 % (ref 43–77)
Platelets: 185 10*3/uL (ref 150–400)
RBC: 4.03 MIL/uL (ref 3.87–5.11)
RDW: 16.1 % — ABNORMAL HIGH (ref 11.5–15.5)
WBC: 9.1 10*3/uL (ref 4.0–10.5)

## 2014-03-31 LAB — BASIC METABOLIC PANEL
ANION GAP: 4 — AB (ref 5–15)
BUN: 18 mg/dL (ref 6–23)
CO2: 24 mmol/L (ref 19–32)
CREATININE: 0.64 mg/dL (ref 0.50–1.10)
Calcium: 8.4 mg/dL (ref 8.4–10.5)
Chloride: 112 mEq/L (ref 96–112)
GFR calc Af Amer: >90 mL/min (ref >90)
GLUCOSE: 127 mg/dL — AB (ref 70–99)
POTASSIUM: 3.6 mmol/L (ref 3.5–5.1)
SODIUM: 140 mmol/L (ref 135–145)

## 2014-03-31 LAB — GLUCOSE, CAPILLARY: Glucose-Capillary: 85 mg/dL (ref 70–99)

## 2014-03-31 MED ORDER — BUTAMBEN-TETRACAINE-BENZOCAINE 2-2-14 % EX AERO: 1.0000 | INHALATION_SPRAY | Freq: Three times a day (TID) | CUTANEOUS | Status: DC

## 2014-03-31 MED ORDER — CARVEDILOL 6.25 MG PO TABS: 6.2500 mg | ORAL_TABLET | Freq: Two times a day (BID) | ORAL | Status: DC

## 2014-03-31 MED ORDER — ATORVASTATIN CALCIUM 80 MG PO TABS: 80.0000 mg | ORAL_TABLET | Freq: Every day | ORAL | Status: DC

## 2014-03-31 MED ORDER — UNABLE TO FIND: Status: DC

## 2014-03-31 MED ORDER — AZITHROMYCIN 250 MG PO TABS: 500.0000 mg | ORAL_TABLET | Freq: Every day | ORAL | Status: DC

## 2014-03-31 MED ORDER — LISINOPRIL 5 MG PO TABS: 5.0000 mg | ORAL_TABLET | Freq: Every day | ORAL | Status: DC

## 2014-03-31 MED ORDER — IPRATROPIUM-ALBUTEROL 0.5-2.5 (3) MG/3ML IN SOLN: 3.0000 mL | Freq: Four times a day (QID) | RESPIRATORY_TRACT | Status: DC | PRN

## 2014-03-31 MED ORDER — PREDNISONE 10 MG PO TABS: 10.0000 mg | ORAL_TABLET | Freq: Every day | ORAL | Status: DC

## 2014-03-31 MED ORDER — AZITHROMYCIN 250 MG PO TABS: 250.0000 mg | ORAL_TABLET | Freq: Every day | ORAL | Status: DC

## 2014-03-31 MED ORDER — BUDESONIDE 0.25 MG/2ML IN SUSP: 0.2500 mg | Freq: Two times a day (BID) | RESPIRATORY_TRACT | Status: DC

## 2014-03-31 NOTE — Care Management Note (Signed)
    Page 1 of 1   03/31/2014     11:18:22 AM CARE MANAGEMENT NOTE 03/31/2014  Patient:  Brenda Flores, Brenda Flores   Account Number:  [id number]  Date Initiated:  03/31/2014  Documentation initiated by:  GRAVES-BIGELOW,Alexandera Kuntzman  Subjective/Objective Assessment:   Pt admitted for cp and upper resp infection. Plan for d/c home today.     Action/Plan:   DME nebulizer ordered for pt via [facility] and will be delivered to room. CM did discuss with pt PT needs and pt refusing services. Per pt she needs assistance with medications. CM unable to assist due to pt has insurance.   Anticipated DC Date:  03/31/2014   Anticipated DC Plan:  HOME/SELF CARE      DC Planning Services  CM consult      PAC Choice  DURABLE MEDICAL EQUIPMENT   Choice offered to / List presented to:  C-1 Patient   DME arranged  NEBULIZER MACHINE      DME agency  [facility].        Status of service:  Completed, signed off Medicare Important Message given?  NO (If response is "NO", the following Medicare IM given date fields will be blank) Date Medicare IM given:   Medicare IM given by:   Date Additional Medicare IM given:   Additional Medicare IM given by:    Discharge Disposition:  HOME/SELF CARE  Per UR Regulation:  Reviewed for med. necessity/level of care/duration of stay  If discussed at [place] of Stay Meetings, dates discussed:    Comments:

## 2014-03-31 NOTE — Progress Notes (Signed)
Late entry for missed G code 04-01-2014:   04/01/2014 1600  PT G-Codes **NOT FOR INPATIENT CLASS**  Functional Assessment Tool Used clinical judgment  Functional Limitation Mobility: Walking and moving around  Mobility: Walking and Moving Around Current Status [phone]) CI  Mobility: Walking and Moving Around Goal Status [phone]) CI  Mobility: Walking and Moving Around Discharge Status [phone]) CI  [facility] Acute [facility] [phone] (pager)

## 2014-03-31 NOTE — Evaluation (Signed)
Physical Therapy Evaluation Patient Details Name: Brenda Flores MRN: [id number] DOB: Nov 11, 1945 Today's Date: 03/31/2014   History of Present Illness  Pt is a 69 year old female admitted to hospital with complaint of chest pain. Pt with negative troponins and possible upper respiratory infection.   Clinical Impression  Pt with decreased endurance and activity tolerance secondary to SOB. Pt demonstrated good balance during ambulation by successfully accepting challenges. Pt stated she could go live with her sister-in-law for a while upon discharge which PT recommended based on pt's low endurance and fatigue, and sister-in-law's more accessible home. Pt did complain of some lightheadedness but orthostatics were normal(see Vitals section below).     Follow Up Recommendations Home health PT;Supervision - Intermittent    Equipment Recommendations  None recommended by PT    Recommendations for Other Services       Precautions / Restrictions Precautions Precautions: Fall Restrictions Weight Bearing Restrictions: No      Mobility  Bed Mobility Overal bed mobility: Needs Assistance Bed Mobility: Supine to Sit     Supine to sit: Modified independent (Device/Increase time)     General bed mobility comments: Pt able to perform bed mobility without physical assist from PT or cuing. Required increased time to perform.   Transfers Overall transfer level: Modified independent Equipment used: None             General transfer comment: Pt able to safely stand from lowered bed without physical assistance. Pt without unsteadiness of loss of balance.   Ambulation/Gait Ambulation/Gait assistance: Supervision Ambulation Distance (Feet): 180 Feet Assistive device: None Gait Pattern/deviations: Step-through pattern;Decreased step length - right;Decreased step length - left;Decreased stride length   Gait velocity interpretation: Below normal speed for age/gender General Gait  Details: Pt able to ambulate safely in hallway with without use of assistive device. Pt reaching for sink for support in room but able to ambulate in middle of hallway without support. Pt became fatigued quickly and stated some SOB despite SpO2 being 94%. Pt able to accept challenges during ambulation.   Stairs            Wheelchair Mobility    Modified Rankin (Stroke Patients Only)       Balance Overall balance assessment: Needs assistance         Standing balance support: During functional activity;No upper extremity supported Standing balance-Leahy Scale: Good Standing balance comment: Pt able to safely accept min-moderate challenge during ambulation.                              Pertinent Vitals/Pain Pain Assessment: Faces Faces Pain Scale: Hurts little more Pain Location: head and chest Pain Descriptors / Indicators: Constant;Throbbing Pain Intervention(s): Limited activity within patient's tolerance;Monitored during session  Supine:  BP:167/83  HR: 66  SpO2: 92% Sitting:  BP: 165/87  HR: 86  SpO2: 92-96% Ambulating: BP: 162/75  HR: 67  SpO2: 94%     Home Living Family/patient expects to be discharged to:: Private residence Living Arrangements: Alone Available Help at Discharge: Family Type of Home: House Home Access: Stairs to enter Entrance Stairs-Rails: None Entrance Stairs-Number of Steps: 3 Home Layout: Two level Home Equipment: Bedside commode;Shower seat Additional Comments: Pt stated she can go live with her sister-in-law when she gets back into town tomorrow morning (04/01/14). Her sister-in-law has a guest bedroom on the first floor and a few stairs to get in.     Prior Function  Level of Independence: Independent               Hand Dominance        Extremity/Trunk Assessment               Lower Extremity Assessment: Overall WFL for tasks assessed         Communication   Communication: No difficulties  Cognition  Arousal/Alertness: Awake/alert Behavior During Therapy: WFL for tasks assessed/performed Overall Cognitive Status: Within Functional Limits for tasks assessed                      General Comments      Exercises        Assessment/Plan    PT Assessment Patient needs continued PT services  PT Diagnosis Difficulty walking   PT Problem List Decreased strength;Decreased activity tolerance;Decreased balance;Decreased mobility;Pain  PT Treatment Interventions Gait training;DME instruction;Stair training;Functional mobility training;Therapeutic activities;Therapeutic exercise;Balance training;Patient/family education   PT Goals (Current goals can be found in the Care Plan section) Acute Rehab PT Goals PT Goal Formulation: With patient Time For Goal Achievement: 04/14/14 Potential to Achieve Goals: Good    Frequency Min 2X/week   Barriers to discharge Inaccessible home environment;Decreased caregiver support      Co-evaluation               End of Session Equipment Utilized During Treatment: Gait belt Activity Tolerance: Patient tolerated treatment well;Patient limited by fatigue Patient left: in chair;with call bell/phone within reach Nurse Communication: Mobility status         Time: 7253-6644 PT Time Calculation (min) (ACUTE ONLY): 37 min   Charges:   PT Evaluation $Initial PT Evaluation Tier I: 1 Procedure PT Treatments $Gait Training: 8-22 mins   PT G CodesJearld Shines SPT 03/31/2014, 11:09 AM   Jearld Shines, SPT  Acute Rehabilitation [phone]

## 2014-03-31 NOTE — Discharge Summary (Addendum)
Discharge Summary  [name] MR#: [id number]  DOB:[birth year]  Date of Admission: 03/28/2014 Date of Discharge: 03/31/2014  Patient's PCP: Brenda Greenland, MD  Attending Physician:Brenda Flores  Consults: None  Discharge Diagnoses: Active Problems:   Hypertension   Chest pain   Pain in the chest   Viral upper respiratory tract infection   Cough   Essential hypertension   Diabetes type 2, controlled   HLD (hyperlipidemia)  Brief Admitting History and Physical On admission: "69 yo female with cad, mvp?, C/o chest pain starting yesterday afternoon, left sided with radiation to the left arm. Pt notes that the pain got worse at about 7:30 pm and therefore decided to come to the ER. Nitro seemed to help some. After 45 minutes. Pt notes slight dyspnea, and has had cough (nonproductive) since 10/2013. Pt denies fever, chills, n/v, diarrhea, abd pain, brbpr, black stool."  Discharge Medications   Medication List    STOP taking these medications        doxycycline 100 MG capsule  Commonly known as:  VIBRAMYCIN      TAKE these medications        albuterol 108 (90 BASE) MCG/ACT inhaler  Commonly known as:  PROVENTIL HFA;VENTOLIN HFA  Inhale 1-2 puffs into the lungs every 6 (six) hours as needed for wheezing or shortness of breath.     amLODipine 10 MG tablet  Commonly known as:  NORVASC  Take 10 mg by mouth every morning.     atorvastatin 80 MG tablet  Commonly known as:  LIPITOR  Take 1 tablet (80 mg total) by mouth daily at 6 PM.     azithromycin 250 MG tablet  Commonly known as:  ZITHROMAX  Take 1 tablet (250 mg total) by mouth daily.     budesonide 0.25 MG/2ML nebulizer solution  Commonly known as:  PULMICORT  Take 2 mLs (0.25 mg total) by nebulization 2 (two) times daily.     butamben-tetracaine-benzocaine 04-22-12 % spray  Commonly known as:  CETACAINE  Apply 1 spray topically 3 x daily with food.     carvedilol 6.25 MG tablet  Commonly known  as:  COREG  Take 1 tablet (6.25 mg total) by mouth 2 (two) times daily with Flores meal.     chlorpheniramine-HYDROcodone 10-8 MG/5ML Lqcr  Commonly known as:  TUSSIONEX PENNKINETIC ER  Take 5 mLs by mouth every 12 (twelve) hours as needed for cough.     esomeprazole 40 MG capsule  Commonly known as:  NEXIUM  Take 1 capsule (40 mg total) by mouth 2 (two) times daily.     guaiFENesin-codeine 100-10 MG/5ML syrup  Take 5 mLs by mouth 3 (three) times daily as needed for cough.     ipratropium 0.06 % nasal spray  Commonly known as:  ATROVENT  Place 2 sprays into both nostrils 4 (four) times daily.     ipratropium-albuterol 0.5-2.5 (3) MG/3ML Soln  Commonly known as:  DUONEB  Take 3 mLs by nebulization every 6 (six) hours as needed (Shortness of breath).     lisinopril 5 MG tablet  Commonly known as:  PRINIVIL,ZESTRIL  Take 1 tablet (5 mg total) by mouth daily.     predniSONE 10 MG tablet  Commonly known as:  DELTASONE  Take 1 tablet (10 mg total) by mouth daily with breakfast.     sitaGLIPtin 100 MG tablet  Commonly known as:  JANUVIA  Take 100 mg by mouth daily.     tamsulosin 0.4 MG Caps  capsule  Commonly known as:  FLOMAX  Take 1 capsule (0.4 mg total) by mouth daily.     UNABLE TO FIND  Please excuse patient from any work complication from 54/00/8676 until 04/04/2014, as patient was hospitalized.        Hospital Course: Chest pain -Troponin 3 negative , patient ruled out for acute coronary syndrome. -Chest pain most likely secondary to upper respiratory tract infection with refractory cough -Nuclear stress; No acute ST or TW changes on ECG, no reversible ischemia or infarction, normal left ventricular wall motion, LVEF 68%, low risk stress test findings.  Viral Upper respiratory tract infection/Refractory cough with Dyspnea -Patient most likely suffering from upper respiratory tract infection viral in nature, influenza panel negative. -Patient was started on  azithromycin for possible bronchitis which she will continue at the time of discharge. -DuoNeb QID -Solu-Medrol 60 mg twice Flores day, transitioned to prednisone at the time of discharge to complete Flores five-day course. -Pulmicort nebulizer BID  -Guaifenesin-Codeine 10 mg q 4hr PRN -Cardiac echo; grade 1 diastolic dysfunction, nondiagnostic for her chest pain -Patient will likely benefit from outpatient pulmonary function test. -PT recommended arranging for home health at the time of discharge.  To be arranged by case manager prior to discharge.  Nausea -Phenergan for 12.5 mg Q 6hr PRN.  Resolved at the time of discharge.  Gerd -Stable.  Hypertension -Continue amlodipine 10 mg daily -Continue Coreg 6.25 mg BID -Continue lisinopril 5 mg daily  Diabetes type 2 controlled -Continue Tradjenta 30m daily -1/9 hemoglobin A1c= 6.2  HLD -Not within ADA guidelines -Continue Lipitor 80 mg daily  Procedures: 1/9 echocardiogram;LVEF = 65%-70%.  -(grade 1 diastolicdysfunction).  1/9 nuclear stress test; No acute ST or TW changes on ECG  Cultures Influenza panel negative  Antibiotics: Azithromycin to complete Flores course of antibiotics.  Physical Exam: General: Awake, Oriented, No acute distress. HEENT: EOMI. Neck: Supple CV: S1 and S2 Lungs: Clear to ascultation bilaterally Abdomen: Soft, Nontender, Nondistended, +bowel sounds. Ext: Good pulses. Trace edema.  Day of Discharge BP 148/78 mmHg  Pulse 78  Temp(Src) 98.2 F (36.8 C) (Oral)  Resp 18  Ht _0  (1.651 m)  Wt 84.188 kg (185 lb 9.6 oz)  BMI 30.89 kg/m2  SpO2 94%  Results for orders placed or performed during the hospital encounter of 03/28/14 (from the past 48 hour(s))  Glucose, capillary     Status: Abnormal   Collection Time: 03/29/14 11:43 AM  Result Value Ref Range   Glucose-Capillary 131 (H) 70 - 99 mg/dL  Influenza panel by PCR (type Flores & B, H1N1)     Status: None   Collection Time: 03/29/14 11:55 AM  Result  Value Ref Range   Influenza Flores By PCR NEGATIVE NEGATIVE   Influenza B By PCR NEGATIVE NEGATIVE   H1N1 flu by pcr NOT DETECTED NOT DETECTED    Comment:        The Xpert Flu assay (FDA approved for nasal aspirates or washes and nasopharyngeal swab specimens), is intended as an aid in the diagnosis of influenza and should not be used as Flores sole basis for treatment.   Troponin I (q 6hr x 3)     Status: None   Collection Time: 03/29/14  3:15 PM  Result Value Ref Range   Troponin I <0.03 <0.031 ng/mL    Comment:        NO INDICATION OF MYOCARDIAL INJURY. Please note change in reference range.   Glucose, capillary  Status: Abnormal   Collection Time: 03/29/14  4:25 PM  Result Value Ref Range   Glucose-Capillary 145 (H) 70 - 99 mg/dL   Comment 1 Documented in Chart    Comment 2 Notify RN   Glucose, capillary     Status: Abnormal   Collection Time: 03/29/14  9:23 PM  Result Value Ref Range   Glucose-Capillary 145 (H) 70 - 99 mg/dL  Glucose, capillary     Status: None   Collection Time: 03/30/14  7:30 AM  Result Value Ref Range   Glucose-Capillary 99 70 - 99 mg/dL  CBC with Differential     Status: Abnormal   Collection Time: 03/30/14  8:23 AM  Result Value Ref Range   WBC 10.2 4.0 - 10.5 K/uL   RBC 4.32 3.87 - 5.11 MIL/uL   Hemoglobin 11.9 (L) 12.0 - 15.0 g/dL   HCT 36.9 36.0 - 46.0 %   MCV 85.4 78.0 - 100.0 fL   MCH 27.5 26.0 - 34.0 pg   MCHC 32.2 30.0 - 36.0 g/dL   RDW 15.9 (H) 11.5 - 15.5 %   Platelets 193 150 - 400 K/uL   Neutrophils Relative % 67 43 - 77 %   Neutro Abs 6.7 1.7 - 7.7 K/uL   Lymphocytes Relative 25 12 - 46 %   Lymphs Abs 2.6 0.7 - 4.0 K/uL   Monocytes Relative 8 3 - 12 %   Monocytes Absolute 0.9 0.1 - 1.0 K/uL   Eosinophils Relative 0 0 - 5 %   Eosinophils Absolute 0.0 0.0 - 0.7 K/uL   Basophils Relative 0 0 - 1 %   Basophils Absolute 0.0 0.0 - 0.1 K/uL  Comprehensive metabolic panel     Status: Abnormal   Collection Time: 03/30/14  8:23 AM   Result Value Ref Range   Sodium 141 135 - 145 mmol/L    Comment: Please note change in reference range.   Potassium 3.7 3.5 - 5.1 mmol/L    Comment: Please note change in reference range.   Chloride 112 96 - 112 mEq/L   CO2 24 19 - 32 mmol/L   Glucose, Bld 121 (H) 70 - 99 mg/dL   BUN 20 6 - 23 mg/dL   Creatinine, Ser 0.79 0.50 - 1.10 mg/dL   Calcium 8.6 8.4 - 10.5 mg/dL   Total Protein 5.9 (L) 6.0 - 8.3 g/dL   Albumin 3.0 (L) 3.5 - 5.2 g/dL   AST 23 0 - 37 U/L   ALT 21 0 - 35 U/L   Alkaline Phosphatase 61 39 - 117 U/L   Total Bilirubin 0.3 0.3 - 1.2 mg/dL   GFR calc non Af Amer 84 (L) >90 mL/min   GFR calc Af Amer >90 >90 mL/min    Comment: (NOTE) The eGFR has been calculated using the CKD EPI equation. This calculation has not been validated in all clinical situations. eGFR's persistently <90 mL/min signify possible Chronic Kidney Disease.    Anion gap 5 5 - 15  Magnesium     Status: None   Collection Time: 03/30/14  8:23 AM  Result Value Ref Range   Magnesium 2.1 1.5 - 2.5 mg/dL  Glucose, capillary     Status: Abnormal   Collection Time: 03/30/14 12:00 PM  Result Value Ref Range   Glucose-Capillary 124 (H) 70 - 99 mg/dL  Glucose, capillary     Status: Abnormal   Collection Time: 03/30/14  4:42 PM  Result Value Ref Range   Glucose-Capillary 142 (  H) 70 - 99 mg/dL  Glucose, capillary     Status: Abnormal   Collection Time: 03/30/14  9:09 PM  Result Value Ref Range   Glucose-Capillary 142 (H) 70 - 99 mg/dL  Basic metabolic panel     Status: Abnormal   Collection Time: 03/31/14  4:13 AM  Result Value Ref Range   Sodium 140 135 - 145 mmol/L    Comment: Please note change in reference range.   Potassium 3.6 3.5 - 5.1 mmol/L    Comment: Please note change in reference range.   Chloride 112 96 - 112 mEq/L   CO2 24 19 - 32 mmol/L   Glucose, Bld 127 (H) 70 - 99 mg/dL   BUN 18 6 - 23 mg/dL   Creatinine, Ser 0.64 0.50 - 1.10 mg/dL   Calcium 8.4 8.4 - 10.5 mg/dL   GFR  calc non Af Amer >90 >90 mL/min   GFR calc Af Amer >90 >90 mL/min    Comment: (NOTE) The eGFR has been calculated using the CKD EPI equation. This calculation has not been validated in all clinical situations. eGFR's persistently <90 mL/min signify possible Chronic Kidney Disease.    Anion gap 4 (L) 5 - 15  CBC with Differential     Status: Abnormal   Collection Time: 03/31/14  4:13 AM  Result Value Ref Range   WBC 9.1 4.0 - 10.5 K/uL   RBC 4.03 3.87 - 5.11 MIL/uL   Hemoglobin 11.2 (L) 12.0 - 15.0 g/dL   HCT 34.5 (L) 36.0 - 46.0 %   MCV 85.6 78.0 - 100.0 fL   MCH 27.8 26.0 - 34.0 pg   MCHC 32.5 30.0 - 36.0 g/dL   RDW 16.1 (H) 11.5 - 15.5 %   Platelets 185 150 - 400 K/uL   Neutrophils Relative % 74 43 - 77 %   Neutro Abs 6.7 1.7 - 7.7 K/uL   Lymphocytes Relative 19 12 - 46 %   Lymphs Abs 1.8 0.7 - 4.0 K/uL   Monocytes Relative 7 3 - 12 %   Monocytes Absolute 0.7 0.1 - 1.0 K/uL   Eosinophils Relative 0 0 - 5 %   Eosinophils Absolute 0.0 0.0 - 0.7 K/uL   Basophils Relative 0 0 - 1 %   Basophils Absolute 0.0 0.0 - 0.1 K/uL  Glucose, capillary     Status: None   Collection Time: 03/31/14  7:32 AM  Result Value Ref Range   Glucose-Capillary 85 70 - 99 mg/dL   Comment 1 Notify RN     Dg Chest 2 View  03/28/2014   CLINICAL DATA:  Chest pain and short of breath  EXAM: CHEST  2 VIEW  COMPARISON:  CT 10/15/2013  FINDINGS: Normal cardiac silhouette. There is cysts within the liver which tenth the diaphragm and unchanged from prior. No effusion, infiltrate, or pneumothorax. Degenerative osteophytosis of the thoracic spine.  IMPRESSION: No acute cardiopulmonary process.   Electronically Signed   By: Suzy Bouchard M.D.   On: 03/28/2014 23:16   Nm Myocar Multi W/spect W/wall Motion / Ef  03/29/2014   CLINICAL DATA:  Chest pain  EXAM: MYOCARDIAL IMAGING WITH SPECT (REST AND PHARMACOLOGIC-STRESS)  GATED LEFT VENTRICULAR WALL MOTION STUDY  LEFT VENTRICULAR EJECTION FRACTION  TECHNIQUE:  Standard myocardial SPECT imaging was performed after resting intravenous injection of 10 mCi Tc-61m sestamibi. Subsequently, intravenous infusion of Lexiscan was performed under the supervision of the Cardiology staff. At peak effect of the drug, 30 mCi Tc-21m  sestamibi was injected intravenously and standard myocardial SPECT imaging was performed. Quantitative gated imaging was also performed to evaluate left ventricular wall motion, and estimate left ventricular ejection fraction.  COMPARISON:  None.  FINDINGS: Perfusion: No decreased activity in the left ventricle on stress imaging to suggest reversible ischemia or infarction.  Wall Motion: Normal left ventricular wall motion. No left ventricular dilation.  Left Ventricular Ejection Fraction: 68 %  End diastolic volume 74 ml  End systolic volume 24 ml  IMPRESSION: 1. No reversible ischemia or infarction.  2. Normal left ventricular wall motion.  3. Left ventricular ejection fraction 68%  4. Low-risk stress test findings*.  *2012 Appropriate Use Criteria for Coronary Revascularization Focused Update: J Am Coll Cardiol. 4132;44(0):102-725. http://content.airportbarriers.com.aspx?articleid=1201161   Electronically Signed   By: Julian Hy M.D.   On: 03/29/2014 11:31     Disposition: Home  Diet: Diabetic diet  Activity: Resume as tolerated   Follow-up Appts: Discharge Instructions    DME Nebulizer/meds    Complete by:  As directed      Diet Carb Modified    Complete by:  As directed      Discharge instructions    Complete by:  As directed   Please followup with PCP in 1 week. Please discuss with your PCP about arranging for outpatient pulmonary function test. If any worsening respiratory status please seek urgent medical care.     Increase activity slowly    Complete by:  As directed            TESTS THAT NEED FOLLOW-UP None  Time spent on discharge, talking to the patient, and coordinating care: 25  mins.   Signed: Bynum Bellows, MD 03/31/2014, 10:50 AM

## 2014-03-31 NOTE — Progress Notes (Signed)
Nutrition Brief Note  Patient identified on the Malnutrition Screening Tool (MST) Report. Patient with minimal weight loss of 2 lbs over the past 10 months.  Wt Readings from Last 15 Encounters:  03/31/14 185 lb 9.6 oz (84.188 kg)  06/14/13 187 lb (84.823 kg)  04/05/12 187 lb 4.8 oz (84.959 kg)  10/15/11 198 lb (89.812 kg)  04/26/11 198 lb 12.8 oz (90.175 kg)  04/22/11 198 lb 1.9 oz (89.867 kg)  04/12/11 199 lb (90.266 kg)  04/06/11 202 lb (91.627 kg)    Body mass index is 30.89 kg/(m^2). Patient meets criteria for obesity, class 1 based on current BMI.   Current diet order is heart healthy CHO modified, patient is consuming approximately 70% of meals at this time. Labs and medications reviewed.   No nutrition interventions warranted at this time. If nutrition issues arise, please consult RD.   Molli Barrows, RD, LDN, [employer] Pager [phone] After Hours Pager [phone]

## 2014-05-16 DIAGNOSIS — I10 Essential (primary) hypertension: Secondary | ICD-10-CM | POA: Diagnosis not present

## 2014-05-16 DIAGNOSIS — E1165 Type 2 diabetes mellitus with hyperglycemia: Secondary | ICD-10-CM | POA: Diagnosis not present

## 2014-05-16 DIAGNOSIS — L600 Ingrowing nail: Secondary | ICD-10-CM | POA: Diagnosis not present

## 2014-08-07 DIAGNOSIS — I10 Essential (primary) hypertension: Secondary | ICD-10-CM | POA: Diagnosis not present

## 2014-08-07 DIAGNOSIS — R079 Chest pain, unspecified: Secondary | ICD-10-CM | POA: Diagnosis not present

## 2014-08-07 DIAGNOSIS — N182 Chronic kidney disease, stage 2 (mild): Secondary | ICD-10-CM | POA: Diagnosis not present

## 2014-08-07 DIAGNOSIS — I129 Hypertensive chronic kidney disease with stage 1 through stage 4 chronic kidney disease, or unspecified chronic kidney disease: Secondary | ICD-10-CM | POA: Diagnosis not present

## 2014-09-23 ENCOUNTER — Emergency Department (HOSPITAL_COMMUNITY)
Admission: EM | Admit: 2014-09-23 | Discharge: 2014-09-23 | Disposition: A | Discharge status: Home / Self Care | Payer: Medicare Other | Source: Home / Self Care

## 2014-09-23 DIAGNOSIS — M1712 Unilateral primary osteoarthritis, left knee: Secondary | ICD-10-CM | POA: Diagnosis not present

## 2014-09-23 DIAGNOSIS — M7661 Achilles tendinitis, right leg: Secondary | ICD-10-CM | POA: Diagnosis not present

## 2015-01-12 ENCOUNTER — Ambulatory Visit (INDEPENDENT_AMBULATORY_CARE_PROVIDER_SITE_OTHER): Payer: No Typology Code available for payment source | Admitting: Podiatry

## 2015-01-12 ENCOUNTER — Encounter: Payer: Self-pay | Admitting: Podiatry

## 2015-01-12 ENCOUNTER — Ambulatory Visit: Payer: Self-pay | Admitting: Podiatry

## 2015-01-12 VITALS — BP 147/78 | HR 71 | Ht 65.0 in | Wt 193.0 lb

## 2015-01-12 DIAGNOSIS — M79605 Pain in left leg: Secondary | ICD-10-CM

## 2015-01-12 DIAGNOSIS — M7730 Calcaneal spur, unspecified foot: Secondary | ICD-10-CM

## 2015-01-12 DIAGNOSIS — M216X9 Other acquired deformities of unspecified foot: Secondary | ICD-10-CM

## 2015-01-12 DIAGNOSIS — M7660 Achilles tendinitis, unspecified leg: Secondary | ICD-10-CM

## 2015-01-12 DIAGNOSIS — L600 Ingrowing nail: Secondary | ICD-10-CM | POA: Diagnosis not present

## 2015-01-12 DIAGNOSIS — M216X1 Other acquired deformities of right foot: Secondary | ICD-10-CM

## 2015-01-12 DIAGNOSIS — I2510 Atherosclerotic heart disease of native coronary artery without angina pectoris: Secondary | ICD-10-CM

## 2015-01-12 DIAGNOSIS — M79675 Pain in left toe(s): Secondary | ICD-10-CM

## 2015-01-12 DIAGNOSIS — M79673 Pain in unspecified foot: Secondary | ICD-10-CM | POA: Diagnosis not present

## 2015-01-12 DIAGNOSIS — M7661 Achilles tendinitis, right leg: Secondary | ICD-10-CM

## 2015-01-12 NOTE — Patient Instructions (Signed)
Seen for painful feet. Need ingrown nail surgery and need custom orthotics. Return Wednesday for nail procedure and prepare for orthotics.

## 2015-01-12 NOTE — Progress Notes (Signed)
SUBJECTIVE: 69 y.o. year old female diabetic patient presents with complaining of pain on tips of both great toes x duration 1-2 months.  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  Has had right heel pain that is still hurting all day. Patient points back of right posterior heel being source of the heel pain. Been diabetic x 2 years and under control.  Patient relates having left posterior heel spur surgery many years ago.  REVIEW OF SYSTEMS: Constitutional: negative for chills, fatigue, fevers, night sweats and weight loss Eyes: negative Ears, nose, mouth, throat, and face: negative Respiratory: negative Cardiovascular: Has Mitral valve prolapse but is functioning normal.  Gastrointestinal: negative Genitourinary:negative Integument/breast: negative Hematologic/lymphatic: negative Musculoskeletal:Both legs hurt with skin discoloration and pain after been long hours. Left knee problem due to osteoarthritis.  Neurological: negative Endocrine: negative Allergic/Immunologic: negative  OBJECTIVE: DERMATOLOGIC EXAMINATION: Nails: Incurvated nail both great toe with pain L>R.  No active drainage but very sore on left great toe lateral border.  VASCULAR EXAMINATION OF LOWER LIMBS: All pedal pulses are palpable bilateral. NEUROLOGIC EXAMINATION OF THE LOWER LIMBS: All epicritic and tactile sensations grossly intact.  MUSCULOSKELETAL EXAMINATION: Positive for tight Achilles tendon right. Enlarged palpable prominence over the posterior aspect of calcaneus right.   RADIOGRAPHIC FINDINGS: Positive of posterior calcaneal spur bilateral. No subungual spurring over the distal phalanx of both great toe. No gross deformities on forefoot or mid foot area.  ASSESSMENT: Symptomatic ingrown nail left lateral border of hallux. Achilles tendonitis right. Ankle equinus right.   PLAN: Reviewed findings and available treatment options. Debrided and removed ingrown nail left great  toe. Patient will return for ingrown nail surgery later part of this week.

## 2015-01-14 ENCOUNTER — Ambulatory Visit (INDEPENDENT_AMBULATORY_CARE_PROVIDER_SITE_OTHER): Payer: No Typology Code available for payment source | Admitting: Podiatry

## 2015-01-14 ENCOUNTER — Encounter: Payer: Self-pay | Admitting: Podiatry

## 2015-01-14 VITALS — BP 173/90 | HR 65

## 2015-01-14 DIAGNOSIS — L600 Ingrowing nail: Secondary | ICD-10-CM | POA: Diagnosis not present

## 2015-01-14 DIAGNOSIS — M79675 Pain in left toe(s): Secondary | ICD-10-CM | POA: Diagnosis not present

## 2015-01-14 NOTE — Progress Notes (Signed)
SUBJECTIVE: 69 y.o. year old female diabetic patient presents stating that left great toe is still sore and wants to have ingrown nail surgery on left great toe. Right great toe is also sore but will have it done another time.  Patient is wearing open toed shoes on both feet.   OBJECTIVE: DERMATOLOGIC EXAMINATION: Nails: Incurvated nail both great toe with pain L>R.  No active drainage but very sore on left great toe lateral border.  VASCULAR EXAMINATION OF LOWER LIMBS: All pedal pulses are palpable bilateral. NEUROLOGIC EXAMINATION OF THE LOWER LIMBS: All epicritic and tactile sensations grossly intact.  MUSCULOSKELETAL EXAMINATION: Positive for tight Achilles tendon right. Enlarged palpable prominence over the posterior aspect of calcaneus right.   ASSESSMENT: Symptomatic ingrown nail left lateral border of hallux. Achilles tendonitis right. Ankle equinus right.   PLAN: Reviewed findings and available treatment options. Procedure done: Phenol and Alcohol matrixectomy left great toe lateral border. Affected left great toe was anesthetized with total 40ml mixture of 50/50 0.5% Marcaine plain and 1% Xylocaine plain. Affected left great toe nail lateral border was reflected with a nail elevator and excised with nail nipper. Proximal nail matrix tissue was cauterized with Phenol soaked cotton applicator x 4 and neutralized with Alcohol soaked cotton applicator. The wound was dressed with Amerigel ointment dressing. Home care instructions and supply dispensed.  Return in 1 week for follow up.

## 2015-01-14 NOTE — Patient Instructions (Signed)
Ingrown nail surgery was done on left great toe lateral border. Follow soaking instruction.  Some redness and drainage is expected. Call the office if the area gets feverish with increased redness and drainage.  

## 2015-01-20 ENCOUNTER — Encounter: Payer: Self-pay | Admitting: Podiatry

## 2015-01-20 ENCOUNTER — Ambulatory Visit (INDEPENDENT_AMBULATORY_CARE_PROVIDER_SITE_OTHER): Payer: No Typology Code available for payment source | Admitting: Podiatry

## 2015-01-20 VITALS — BP 156/82 | HR 67

## 2015-01-20 DIAGNOSIS — M79671 Pain in right foot: Secondary | ICD-10-CM

## 2015-01-20 DIAGNOSIS — L600 Ingrowing nail: Secondary | ICD-10-CM

## 2015-01-20 DIAGNOSIS — M79604 Pain in right leg: Secondary | ICD-10-CM

## 2015-01-20 NOTE — Progress Notes (Signed)
SUBJECTIVE: 69 y.o. year old female diabetic patient presents for follow up on left great toe ingrown nail surgery. Also wants to have the right great toe nail fixed while she is not able to return to work wearing open toed shoes.   OBJECTIVE: DERMATOLOGIC EXAMINATION: Post op nail left great toe lateral border clean and healing well. Incurvated nail right great toe with pain.  VASCULAR EXAMINATION OF LOWER LIMBS: All pedal pulses are palpable bilateral. NEUROLOGIC EXAMINATION OF THE LOWER LIMBS: All epicritic and tactile sensations grossly intact.  MUSCULOSKELETAL EXAMINATION: Positive for tight Achilles tendon right. Enlarged palpable prominence over the posterior aspect of calcaneus right.   ASSESSMENT: Post op nail left great toe healing well without infection. Right great toe ingrown symptomatic.   PLAN: Reviewed findings and available treatment options. Left great toe nail cleansed with Iodine and applied DSS.  Procedure done today:  Phenol and Alcohol matrixectomy right great toe lateral border. Affected right great toe was anesthetized with total 64ml mixture of 50/50 0.5% Marcaine plain and 1% Xylocaine plain. Affected right great toe nail lateral border was reflected with a nail elevator and excised with nail nipper. Proximal nail matrix tissue was cauterized with Phenol soaked cotton applicator x 4 and neutralized with Alcohol soaked cotton applicator. The wound was dressed with Amerigel ointment dressing. Home care instructions and supply dispensed.  Return in 1 week for follow up

## 2015-01-20 NOTE — Patient Instructions (Signed)
Ingrown nail surgery was done on right great toe. Follow soaking instruction.  Some redness and drainage is expected. Call the office if the area gets feverish with increased redness and drainage.

## 2015-01-27 ENCOUNTER — Encounter: Payer: Self-pay | Admitting: Podiatry

## 2015-01-27 ENCOUNTER — Ambulatory Visit (INDEPENDENT_AMBULATORY_CARE_PROVIDER_SITE_OTHER): Payer: No Typology Code available for payment source | Admitting: Podiatry

## 2015-01-27 VITALS — BP 144/85 | HR 70

## 2015-01-27 DIAGNOSIS — Z9889 Other specified postprocedural states: Secondary | ICD-10-CM

## 2015-01-27 NOTE — Patient Instructions (Signed)
Post op wound healed well on both great toe. Ok to return to work.  Return as needed.

## 2015-01-27 NOTE — Progress Notes (Signed)
Post op nails both great toes. All healed and dry. Ok to return to work.

## 2015-05-01 DIAGNOSIS — R51 Headache: Secondary | ICD-10-CM | POA: Diagnosis not present

## 2015-05-01 DIAGNOSIS — N182 Chronic kidney disease, stage 2 (mild): Secondary | ICD-10-CM | POA: Diagnosis not present

## 2015-05-01 DIAGNOSIS — N08 Glomerular disorders in diseases classified elsewhere: Secondary | ICD-10-CM | POA: Diagnosis not present

## 2015-05-01 DIAGNOSIS — I129 Hypertensive chronic kidney disease with stage 1 through stage 4 chronic kidney disease, or unspecified chronic kidney disease: Secondary | ICD-10-CM | POA: Diagnosis not present

## 2015-05-01 DIAGNOSIS — E1122 Type 2 diabetes mellitus with diabetic chronic kidney disease: Secondary | ICD-10-CM | POA: Diagnosis not present

## 2015-05-01 DIAGNOSIS — R0789 Other chest pain: Secondary | ICD-10-CM | POA: Diagnosis not present

## 2015-06-29 ENCOUNTER — Encounter (HOSPITAL_COMMUNITY): Payer: Self-pay | Admitting: Emergency Medicine

## 2015-06-29 ENCOUNTER — Ambulatory Visit (HOSPITAL_COMMUNITY)
Admission: EM | Admit: 2015-06-29 | Discharge: 2015-06-29 | Disposition: A | Discharge status: Home / Self Care | Payer: Medicare Other | Attending: Emergency Medicine | Admitting: Emergency Medicine

## 2015-06-29 DIAGNOSIS — R059 Cough, unspecified: Secondary | ICD-10-CM

## 2015-06-29 DIAGNOSIS — R0982 Postnasal drip: Secondary | ICD-10-CM

## 2015-06-29 DIAGNOSIS — R05 Cough: Secondary | ICD-10-CM

## 2015-06-29 NOTE — ED Provider Notes (Signed)
CSN: [id number]     Arrival date & time 06/29/15  1306 History   First MD Initiated Contact with Patient 06/29/15 1420     Chief Complaint  Patient presents with  . Cough   (Consider location/radiation/quality/duration/timing/severity/associated sxs/prior Treatment) HPI Comments: 70 year old female presents to the urgent care with a complaint of cough. She states last week she was diagnosed with having the flu but her symptoms have abated except for the cough. She denies asthma or smoking. She denies perceived PND. Denies shortness of breath or chest pain. 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  Patient is a 70 y.o. female presenting with cough.  Cough Associated symptoms: no rhinorrhea, no shortness of breath and no sore throat     Past Medical History  Diagnosis Date  . Osteoporosis   . CAD (coronary artery disease) CARDIOLOGIST-  DR Cristopher Peru--  LOV  2013    non-obs CAD by cath in 7/07 (mLAD 30-40%, EF 65%)  . History of rectal polyps     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  . Hypertension   . Hyperlipidemia   . History of mitral valve prolapse     PER PT REPORTED MVP  PER DR HARSHAW FROM CARDIAC CATH IN 1987 (APPROX)  . GERD (gastroesophageal reflux disease)   . Liver, polycystic   . Right ureteral stone   . Left nephrolithiasis     NON-OBSTRUCTIVE  . Lumbar radiculopathy   . DJD (degenerative joint disease)     KNEES  . DDD (degenerative disc disease), lumbar   . Type 2 diabetes mellitus [facility])    Past Surgical History  Procedure Laterality Date  . Heel spur surgery Left 2003  . Cardiac catheterization  10-17-2005   DR COOPER    NON-OBSTRUCTIVE CAD/   mLAD 30-40%/   NORMAL LVF/  EF 65%  . Transthoracic echocardiogram  04-07-2011    MILD LVH/  MILD FOCAL BASAL SEPTAL HYPERTROPHY/  EF  55-60% /  GRADE I DIASTOLIC DYSFUNCTION  . Cardiovascular stress test  04-12-2011  DR Carleene Overlie TAYLOR    LOW RISK STUDY/  NO ISCHEMIA/  EF 68%   . Cataract extraction w/ intraocular lens implant Right 2003  . Total abdominal hysterectomy w/ bilateral salpingoophorectomy  1978  . Colonoscopy w/ polypectomy  03-17-2011  . Cystoscopy with ureteroscopy Right 06/14/2013    Procedure: RIGHT  URETEROSCOPY,ureteral and urethral dilitation;  Surgeon: Claybon Jabs, MD;  Location: [facility];  Service: Urology;  Laterality: Right;   Family History  Problem Relation Age of Onset  . Cancer Mother     breast  . Cancer Father     colon  . Cancer Brother     ?lung   Social History  Substance Use Topics  . Smoking status: Never Smoker   . Smokeless tobacco: Never Used  . Alcohol Use: No   OB History    No data available     Review of Systems  Constitutional: Negative for activity change and fatigue.  HENT: Negative for congestion, postnasal drip, rhinorrhea and sore throat.   Respiratory: Positive for cough. Negative for shortness of breath.   Gastrointestinal: Negative.   Genitourinary: Negative.   Neurological: Negative.   All other systems reviewed and are negative.   Allergies  Darvocet and Zofran  Home Medications   Prior to Admission medications   Medication Sig Start Date End Date Taking? Authorizing Provider  albuterol (PROVENTIL HFA;VENTOLIN  HFA) 108 (90 BASE) MCG/ACT inhaler Inhale 1-2 puffs into the lungs every 6 (six) hours as needed for wheezing or shortness of breath. 10/16/13   Ezequiel Essex, MD  amLODipine (NORVASC) 10 MG tablet Take 10 mg by mouth every morning. 04/06/12   Barton Dubois, MD  atorvastatin (LIPITOR) 80 MG tablet Take 1 tablet (80 mg total) by mouth daily at 6 PM. 03/31/14   Bynum Bellows, MD  azithromycin (ZITHROMAX) 250 MG tablet Take 1 tablet (250 mg total) by mouth daily. 03/31/14   Srikar Janna Arch, MD  budesonide (PULMICORT) 0.25 MG/2ML nebulizer solution Take 2 mLs (0.25 mg total) by nebulization 2 (two) times daily. 03/31/14   Bynum Bellows, MD   butamben-tetracaine-benzocaine (CETACAINE) 04-22-12 % spray Apply 1 spray topically 3 x daily with food. 03/31/14   Bynum Bellows, MD  carvedilol (COREG) 6.25 MG tablet Take 1 tablet (6.25 mg total) by mouth 2 (two) times daily with a meal. 03/31/14   Bynum Bellows, MD  chlorpheniramine-HYDROcodone (TUSSIONEX PENNKINETIC ER) 10-8 MG/5ML LQCR Take 5 mLs by mouth every 12 (twelve) hours as needed for cough. 10/08/13   Billy Fischer, MD  esomeprazole (NEXIUM) 40 MG capsule Take 1 capsule (40 mg total) by mouth 2 (two) times daily. 04/06/12   Barton Dubois, MD  guaiFENesin-codeine 100-10 MG/5ML syrup Take 5 mLs by mouth 3 (three) times daily as needed for cough. 10/16/13   Ezequiel Essex, MD  ipratropium (ATROVENT) 0.06 % nasal spray Place 2 sprays into both nostrils 4 (four) times daily. 10/08/13   Billy Fischer, MD  ipratropium-albuterol (DUONEB) 0.5-2.5 (3) MG/3ML SOLN Take 3 mLs by nebulization every 6 (six) hours as needed (Shortness of breath). 03/31/14   Srikar Janna Arch, MD  lisinopril (PRINIVIL,ZESTRIL) 5 MG tablet Take 1 tablet (5 mg total) by mouth daily. 03/31/14   Srikar Janna Arch, MD  predniSONE (DELTASONE) 10 MG tablet Take 1 tablet (10 mg total) by mouth daily with breakfast. 03/31/14   Bynum Bellows, MD  sitaGLIPtin (JANUVIA) 100 MG tablet Take 100 mg by mouth daily.    Historical Provider, MD  tamsulosin (FLOMAX) 0.4 MG CAPS capsule Take 1 capsule (0.4 mg total) by mouth daily. 06/05/13   Sherwood Gambler, MD  UNABLE TO FIND Please excuse patient from any work complication from [id number] until 04/04/2014, as patient was hospitalized. 03/31/14   Bynum Bellows, MD   Meds Ordered and Administered this Visit  Medications - No data to display  BP 130/75 mmHg  Pulse 60  Temp(Src) 99.2 F (37.3 C) (Oral)  Resp 16  SpO2 100% No data found.   Physical Exam  Constitutional: She is oriented to person, place, and time. She appears well-developed and well-nourished. No distress.  HENT:   Mouth/Throat: No oropharyngeal exudate.  Bilateral TMs are normal. Oropharynx with minor injection and mild to moderate clear PND. No exudates or swelling  Eyes: Conjunctivae and EOM are normal.  Neck: Normal range of motion. Neck supple.  Cardiovascular: Normal rate and normal heart sounds.   Pulmonary/Chest: Effort normal and breath sounds normal. No respiratory distress. She has no wheezes. She has no rales.  Musculoskeletal: She exhibits no edema.  Neurological: She is alert and oriented to person, place, and time. She exhibits normal muscle tone.  Skin: Skin is warm and dry.  Psychiatric: She has a normal mood and affect.  Nursing note and vitals reviewed.   ED Course  Procedures (including critical care time)  Labs Review  Labs Reviewed - No data to display  Imaging Review No results found.   Visual Acuity Review  Right Eye Distance:   Left Eye Distance:   Bilateral Distance:    Right Eye Near:   Left Eye Near:    Bilateral Near:         MDM   1. Cough   2. PND (post-nasal drip)    Your cough is likely due to drainage in the back of your throat from your sinuses. Recommended she take Delsym to help with cough and take either Zyrtec, Claritin or Allegra to minimize drainage.     Janne Napoleon, NP 06/29/15 1435

## 2015-06-29 NOTE — ED Notes (Addendum)
Here with cough that worsens at bedtime Lingering post Flu last week Denies chest pain,sob or swelling Need excused from work th return tomorrow without fever,chills,n,v Temp 99.2 Right arm pain reported x1 week

## 2015-06-29 NOTE — Discharge Instructions (Signed)
Cough, Adult Your cough is likely due to drainage in the back of your throat from your sinuses. Recommended she take Delsym to help with cough and take either Zyrtec, Claritin or Allegra to minimize drainage. Coughing is a reflex that clears your throat and your airways. Coughing helps to heal and protect your lungs. It is normal to cough occasionally, but a cough that happens with other symptoms or lasts a long time may be a sign of a condition that needs treatment. A cough may last only 2-3 weeks (acute), or it may last longer than 8 weeks (chronic). CAUSES Coughing is commonly caused by:  Breathing in substances that irritate your lungs.  A viral or bacterial respiratory infection.  Allergies.  Asthma.  Postnasal drip.  Smoking.  Acid backing up from the stomach into the esophagus (gastroesophageal reflux).  Certain medicines.  Chronic lung problems, including COPD (or rarely, lung cancer).  Other medical conditions such as heart failure. HOME CARE INSTRUCTIONS  Pay attention to any changes in your symptoms. Take these actions to help with your discomfort:  Take medicines only as told by your health care provider.  If you were prescribed an antibiotic medicine, take it as told by your health care provider. Do not stop taking the antibiotic even if you start to feel better.  Talk with your health care provider before you take a cough suppressant medicine.  Drink enough fluid to keep your urine clear or pale yellow.  If the air is dry, use a cold steam vaporizer or humidifier in your bedroom or your home to help loosen secretions.  Avoid anything that causes you to cough at work or at home.  If your cough is worse at night, try sleeping in a semi-upright position.  Avoid cigarette smoke. If you smoke, quit smoking. If you need help quitting, ask your health care provider.  Avoid caffeine.  Avoid alcohol.  Rest as needed. SEEK MEDICAL CARE IF:   You have new  symptoms.  You cough up pus.  Your cough does not get better after 2-3 weeks, or your cough gets worse.  You cannot control your cough with suppressant medicines and you are losing sleep.  You develop pain that is getting worse or pain that is not controlled with pain medicines.  You have a fever.  You have unexplained weight loss.  You have night sweats. SEEK IMMEDIATE MEDICAL CARE IF:  You cough up blood.  You have difficulty breathing.  Your heartbeat is very fast.   This information is not intended to replace advice given to you by your health care provider. Make sure you discuss any questions you have with your health care provider.   Document Released: 09/03/2010 Document Revised: 11/26/2014 Document Reviewed: 05/14/2014 Elsevier Interactive Patient Education [employer].

## 2015-07-11 ENCOUNTER — Encounter (HOSPITAL_COMMUNITY): Payer: Self-pay | Admitting: Emergency Medicine

## 2015-07-11 ENCOUNTER — Emergency Department (HOSPITAL_COMMUNITY): Payer: Medicare Other

## 2015-07-11 ENCOUNTER — Emergency Department (HOSPITAL_COMMUNITY)
Admission: EM | Admit: 2015-07-11 | Discharge: 2015-07-11 | Disposition: A | Discharge status: Home / Self Care | Payer: Medicare Other | Attending: Emergency Medicine | Admitting: Emergency Medicine

## 2015-07-11 DIAGNOSIS — H539 Unspecified visual disturbance: Secondary | ICD-10-CM

## 2015-07-11 DIAGNOSIS — E785 Hyperlipidemia, unspecified: Secondary | ICD-10-CM | POA: Insufficient documentation

## 2015-07-11 DIAGNOSIS — R51 Headache: Secondary | ICD-10-CM | POA: Diagnosis not present

## 2015-07-11 DIAGNOSIS — M179 Osteoarthritis of knee, unspecified: Secondary | ICD-10-CM | POA: Diagnosis not present

## 2015-07-11 DIAGNOSIS — Z79899 Other long term (current) drug therapy: Secondary | ICD-10-CM | POA: Insufficient documentation

## 2015-07-11 DIAGNOSIS — H538 Other visual disturbances: Secondary | ICD-10-CM | POA: Diagnosis not present

## 2015-07-11 DIAGNOSIS — R042 Hemoptysis: Secondary | ICD-10-CM | POA: Diagnosis not present

## 2015-07-11 DIAGNOSIS — Z7951 Long term (current) use of inhaled steroids: Secondary | ICD-10-CM | POA: Diagnosis not present

## 2015-07-11 DIAGNOSIS — I2510 Atherosclerotic heart disease of native coronary artery without angina pectoris: Secondary | ICD-10-CM | POA: Insufficient documentation

## 2015-07-11 DIAGNOSIS — I10 Essential (primary) hypertension: Secondary | ICD-10-CM | POA: Diagnosis not present

## 2015-07-11 DIAGNOSIS — L02412 Cutaneous abscess of left axilla: Secondary | ICD-10-CM | POA: Insufficient documentation

## 2015-07-11 DIAGNOSIS — Z7984 Long term (current) use of oral hypoglycemic drugs: Secondary | ICD-10-CM | POA: Diagnosis not present

## 2015-07-11 DIAGNOSIS — Z792 Long term (current) use of antibiotics: Secondary | ICD-10-CM | POA: Insufficient documentation

## 2015-07-11 DIAGNOSIS — E119 Type 2 diabetes mellitus without complications: Secondary | ICD-10-CM | POA: Diagnosis not present

## 2015-07-11 DIAGNOSIS — Z7952 Long term (current) use of systemic steroids: Secondary | ICD-10-CM | POA: Diagnosis not present

## 2015-07-11 DIAGNOSIS — R519 Headache, unspecified: Secondary | ICD-10-CM

## 2015-07-11 LAB — I-STAT CHEM 8, ED
BUN: 15 mg/dL (ref 6–20)
CHLORIDE: 107 mmol/L (ref 101–111)
Calcium, Ion: 1.11 mmol/L — ABNORMAL LOW (ref 1.13–1.30)
Creatinine, Ser: 0.6 mg/dL (ref 0.44–1.00)
GLUCOSE: 94 mg/dL (ref 65–99)
HCT: 41 % (ref 36.0–46.0)
Hemoglobin: 13.9 g/dL (ref 12.0–15.0)
Potassium: 3.7 mmol/L (ref 3.5–5.1)
Sodium: 143 mmol/L (ref 135–145)
TCO2: 25 mmol/L (ref 0–100)

## 2015-07-11 LAB — CBC WITH DIFFERENTIAL/PLATELET
BASOS PCT: 0 %
Basophils Absolute: 0 10*3/uL (ref 0.0–0.1)
EOS PCT: 3 %
Eosinophils Absolute: 0.2 10*3/uL (ref 0.0–0.7)
HCT: 38.6 % (ref 36.0–46.0)
Hemoglobin: 12.6 g/dL (ref 12.0–15.0)
LYMPHS ABS: 2.3 10*3/uL (ref 0.7–4.0)
Lymphocytes Relative: 40 %
MCH: 27.8 pg (ref 26.0–34.0)
MCHC: 32.6 g/dL (ref 30.0–36.0)
MCV: 85.2 fL (ref 78.0–100.0)
MONOS PCT: 6 %
Monocytes Absolute: 0.3 10*3/uL (ref 0.1–1.0)
Neutro Abs: 3 10*3/uL (ref 1.7–7.7)
Neutrophils Relative %: 51 %
PLATELETS: 213 10*3/uL (ref 150–400)
RBC: 4.53 MIL/uL (ref 3.87–5.11)
RDW: 15.3 % (ref 11.5–15.5)
WBC: 5.8 10*3/uL (ref 4.0–10.5)

## 2015-07-11 LAB — C-REACTIVE PROTEIN: CRP: 0.7 mg/dL (ref <1.0)

## 2015-07-11 LAB — SEDIMENTATION RATE: Sed Rate: 42 mm/hr — ABNORMAL HIGH (ref 0–22)

## 2015-07-11 MED ORDER — LIDOCAINE-EPINEPHRINE 2 %-1:100000 IJ SOLN: 20.0000 mL | Freq: Once | INTRAMUSCULAR | Status: DC

## 2015-07-11 MED ORDER — IBUPROFEN 200 MG PO TABS
400.0000 mg | ORAL_TABLET | Freq: Once | ORAL | Status: AC
  Administered 2015-07-11: 400 mg via ORAL
  Filled 2015-07-11: qty 2

## 2015-07-11 MED ORDER — ACETAMINOPHEN 325 MG PO TABS
650.0000 mg | ORAL_TABLET | Freq: Once | ORAL | Status: AC
  Administered 2015-07-11: 650 mg via ORAL
  Filled 2015-07-11: qty 2

## 2015-07-11 MED ORDER — HYDROCODONE-ACETAMINOPHEN 5-325 MG PO TABS: 2.0000 | ORAL_TABLET | ORAL | Status: DC | PRN

## 2015-07-11 MED ORDER — LIDOCAINE-EPINEPHRINE (PF) 2 %-1:200000 IJ SOLN
20.0000 mL | Freq: Once | INTRAMUSCULAR | Status: AC
  Administered 2015-07-11: 20 mL via INTRADERMAL
  Filled 2015-07-11: qty 20

## 2015-07-11 NOTE — ED Provider Notes (Signed)
CSN: [id number]     Arrival date & time 07/11/15  1002 History   First MD Initiated Contact with Patient 07/11/15 1011     Chief Complaint  Patient presents with  . Headache     (Consider location/radiation/quality/duration/timing/severity/associated sxs/prior Treatment) HPI Complains of right-sided parietal temporal occipital headache gradual onset yesterday, accompanied by blurred vision in her right eye. Pain gradual onset, waxes and wanes she's treated herself with Tylenol without relief. No fever no trauma. Associates symptoms include photophobia. She denies nausea denies feeling ill. No other associated symptoms. Patient also reports she coughed one time this morning with a tiny amount of blood. She denies any shortness of breath. She also complains of a "boil" at left axilla for one week which is red and painful. No other associated symptoms. Past Medical History  Diagnosis Date  . Osteoporosis   . CAD (coronary artery disease) CARDIOLOGIST-  DR Cristopher Peru--  LOV  2013    non-obs CAD by cath in 7/07 (mLAD 30-40%, EF 65%)  . History of rectal polyps     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  . Hypertension   . Hyperlipidemia   . History of mitral valve prolapse     PER PT REPORTED MVP  PER DR HARSHAW FROM CARDIAC CATH IN 1987 (APPROX)  . GERD (gastroesophageal reflux disease)   . Liver, polycystic   . Right ureteral stone   . Left nephrolithiasis     NON-OBSTRUCTIVE  . Lumbar radiculopathy   . DJD (degenerative joint disease)     KNEES  . DDD (degenerative disc disease), lumbar   . Type 2 diabetes mellitus [facility])    Past Surgical History  Procedure Laterality Date  . Heel spur surgery Left 2003  . Cardiac catheterization  10-17-2005   DR COOPER    NON-OBSTRUCTIVE CAD/   mLAD 30-40%/   NORMAL LVF/  EF 65%  . Transthoracic echocardiogram  04-07-2011    MILD LVH/  MILD FOCAL BASAL SEPTAL HYPERTROPHY/  EF  55-60% /  GRADE I DIASTOLIC DYSFUNCTION  .  Cardiovascular stress test  04-12-2011  DR Carleene Overlie TAYLOR    LOW RISK STUDY/  NO ISCHEMIA/  EF 68%  . Cataract extraction w/ intraocular lens implant Right 2003  . Total abdominal hysterectomy w/ bilateral salpingoophorectomy  1978  . Colonoscopy w/ polypectomy  03-17-2011  . Cystoscopy with ureteroscopy Right 06/14/2013    Procedure: RIGHT  URETEROSCOPY,ureteral and urethral dilitation;  Surgeon: Claybon Jabs, MD;  Location: [facility];  Service: Urology;  Laterality: Right;   Family History  Problem Relation Age of Onset  . Cancer Mother     breast  . Cancer Father     colon  . Cancer Brother     ?lung   Social History  Substance Use Topics  . Smoking status: Never Smoker   . Smokeless tobacco: Never Used  . Alcohol Use: No   OB History    No data available     Review of Systems  Constitutional: Negative.   HENT: Negative.   Eyes: Positive for photophobia and visual disturbance.  Respiratory: Positive for cough.        Coughed one time, blood tinged  Cardiovascular: Negative.   Gastrointestinal: Negative.   Musculoskeletal: Negative.   Skin: Positive for wound.       Abscess left axilla  Neurological: Negative.   Psychiatric/Behavioral: Negative.   All other systems reviewed and are negative.     Allergies  Darvocet and Zofran  Home Medications   Prior to Admission medications   Medication Sig Start Date End Date Taking? Authorizing Provider  acetaminophen (TYLENOL) 500 MG tablet Take 1,000 mg by mouth every 6 (six) hours as needed for headache.   Yes Historical Provider, MD  albuterol (PROVENTIL HFA;VENTOLIN HFA) 108 (90 BASE) MCG/ACT inhaler Inhale 1-2 puffs into the lungs every 6 (six) hours as needed for wheezing or shortness of breath. 10/16/13  Yes Ezequiel Essex, MD  amLODipine (NORVASC) 10 MG tablet Take 10 mg by mouth every morning. 04/06/12  Yes Barton Dubois, MD  carvedilol (COREG) 6.25 MG tablet Take 1 tablet (6.25 mg total) by mouth  2 (two) times daily with a meal. 03/31/14  Yes Srikar Janna Arch, MD  sitaGLIPtin (JANUVIA) 100 MG tablet Take 100 mg by mouth daily.   Yes Historical Provider, MD  atorvastatin (LIPITOR) 80 MG tablet Take 1 tablet (80 mg total) by mouth daily at 6 PM. Patient not taking: Reported on 07/11/2015 03/31/14   Bynum Bellows, MD  azithromycin (ZITHROMAX) 250 MG tablet Take 1 tablet (250 mg total) by mouth daily. Patient not taking: Reported on 07/11/2015 03/31/14   Bynum Bellows, MD  budesonide (PULMICORT) 0.25 MG/2ML nebulizer solution Take 2 mLs (0.25 mg total) by nebulization 2 (two) times daily. Patient not taking: Reported on 07/11/2015 03/31/14   Bynum Bellows, MD  butamben-tetracaine-benzocaine (CETACAINE) 04-22-12 % spray Apply 1 spray topically 3 x daily with food. Patient not taking: Reported on 07/11/2015 03/31/14   Bynum Bellows, MD  chlorpheniramine-HYDROcodone [facility] PENNKINETIC ER) 10-8 MG/5ML LQCR Take 5 mLs by mouth every 12 (twelve) hours as needed for cough. Patient not taking: Reported on 07/11/2015 10/08/13   Billy Fischer, MD  esomeprazole (NEXIUM) 40 MG capsule Take 1 capsule (40 mg total) by mouth 2 (two) times daily. Patient not taking: Reported on 07/11/2015 04/06/12   Barton Dubois, MD  guaiFENesin-codeine 100-10 MG/5ML syrup Take 5 mLs by mouth 3 (three) times daily as needed for cough. Patient not taking: Reported on 07/11/2015 10/16/13   Ezequiel Essex, MD  ipratropium (ATROVENT) 0.06 % nasal spray Place 2 sprays into both nostrils 4 (four) times daily. Patient not taking: Reported on 07/11/2015 10/08/13   Billy Fischer, MD  ipratropium-albuterol (DUONEB) 0.5-2.5 (3) MG/3ML SOLN Take 3 mLs by nebulization every 6 (six) hours as needed (Shortness of breath). Patient not taking: Reported on 07/11/2015 03/31/14   Bynum Bellows, MD  lisinopril (PRINIVIL,ZESTRIL) 5 MG tablet Take 1 tablet (5 mg total) by mouth daily. Patient not taking: Reported on 07/11/2015 03/31/14   Bynum Bellows, MD   predniSONE (DELTASONE) 10 MG tablet Take 1 tablet (10 mg total) by mouth daily with breakfast. Patient not taking: Reported on 07/11/2015 03/31/14   Bynum Bellows, MD  tamsulosin (FLOMAX) 0.4 MG CAPS capsule Take 1 capsule (0.4 mg total) by mouth daily. Patient not taking: Reported on 07/11/2015 06/05/13   Sherwood Gambler, MD  UNABLE TO FIND Please excuse patient from any work complication from [id number] until 04/04/2014, as patient was hospitalized. Patient not taking: Reported on 07/11/2015 03/31/14   Bynum Bellows, MD   BP 141/73 mmHg  Pulse 66  Temp(Src) 98.5 F (36.9 C) (Oral)  Resp 16  SpO2 99% Physical Exam  Constitutional: She is oriented to person, place, and time. She appears well-developed and well-nourished. No distress.  HENT:  Head: Normocephalic and atraumatic.  Eyes: Conjunctivae are normal. Pupils are equal, round,  and reactive to light. Right eye exhibits no discharge. Left eye exhibits no discharge.  No sign conjunctivae erythema. Corneas appear grossly normal.  Neck: Neck supple. No tracheal deviation present. No thyromegaly present.  Cardiovascular: Normal rate and regular rhythm.   No murmur heard. Pulmonary/Chest: Effort normal and breath sounds normal.  Abdominal: Soft. Bowel sounds are normal. She exhibits no distension. There is no tenderness.  Obese  Musculoskeletal: Normal range of motion. She exhibits no edema or tenderness.  Neurological: She is alert and oriented to person, place, and time. No cranial nerve deficit. She exhibits normal muscle tone. Coordination normal.  Gait normal Romberg normal pronator drift normal DTRs symmetric bilaterally at knee jerk ankle jerk and biceps toes downward going bilaterally finger to nose normal motor strength 5 over 5 overall  Skin: Skin is warm and dry. No rash noted.  2 cm abscess at left axilla  Psychiatric: She has a normal mood and affect.  Nursing note and vitals reviewed.   ED Course  Procedures (including  critical care time) Labs Review Labs Reviewed - No data to display  Imaging Review No results found. I have personally reviewed and evaluated these images and lab results as part of my medical decision-making.   EKG Interpretation None     INCISION AND DRAINAGE Performed by: Orlie Dakin Consent: Verbal consent obtained. Risks and benefits: risks, benefits and alternatives were discussed Type: abscess  Body area: left axilla  Anesthesia: local infiltration  Incision was made with a scalpel.  Local anesthetic: lidocaine 2% with epinephrine  Anesthetic total: 3 ml  Complexity: complex Blunt dissection to break up loculations  Drainage: purulent  Drainage amount: copious    Patient tolerance: Patient tolerated the procedure well with no immediate complications.   Results for orders placed or performed during the hospital encounter of 07/11/15  CBC with Differential/Platelet  Result Value Ref Range   WBC 5.8 4.0 - 10.5 K/uL   RBC 4.53 3.87 - 5.11 MIL/uL   Hemoglobin 12.6 12.0 - 15.0 g/dL   HCT 38.6 36.0 - 46.0 %   MCV 85.2 78.0 - 100.0 fL   MCH 27.8 26.0 - 34.0 pg   MCHC 32.6 30.0 - 36.0 g/dL   RDW 15.3 11.5 - 15.5 %   Platelets 213 150 - 400 K/uL   Neutrophils Relative % 51 %   Neutro Abs 3.0 1.7 - 7.7 K/uL   Lymphocytes Relative 40 %   Lymphs Abs 2.3 0.7 - 4.0 K/uL   Monocytes Relative 6 %   Monocytes Absolute 0.3 0.1 - 1.0 K/uL   Eosinophils Relative 3 %   Eosinophils Absolute 0.2 0.0 - 0.7 K/uL   Basophils Relative 0 %   Basophils Absolute 0.0 0.0 - 0.1 K/uL  Sedimentation rate  Result Value Ref Range   Sed Rate 42 (H) 0 - 22 mm/hr  I-stat chem 8, ed  Result Value Ref Range   Sodium 143 135 - 145 mmol/L   Potassium 3.7 3.5 - 5.1 mmol/L   Chloride 107 101 - 111 mmol/L   BUN 15 6 - 20 mg/dL   Creatinine, Ser 0.60 0.44 - 1.00 mg/dL   Glucose, Bld 94 65 - 99 mg/dL   Calcium, Ion 1.11 (L) 1.13 - 1.30 mmol/L   TCO2 25 0 - 100 mmol/L   Hemoglobin  13.9 12.0 - 15.0 g/dL   HCT 41.0 36.0 - 46.0 %   Dg Chest 2 View  07/11/2015  CLINICAL DATA:  Hemoptysis x 1 this  a.m., accompanied with headache to right side. H/o MVP, type 2 diabetes, asthma, controlled HTN. Former smoker. EXAM: CHEST - 2 VIEW COMPARISON:  03/28/2014 FINDINGS: Lungs are clear. Heart size and mediastinal contours are within normal limits. Focal eventration of the right diaphragmatic leaflet as before. No effusion. Minimal spurring in the mid thoracic spine. IMPRESSION: No acute cardiopulmonary disease. Electronically Signed   By: Lucrezia Europe M.D.   On: 07/11/2015 12:06   Ct Head Wo Contrast  07/11/2015  CLINICAL DATA:  70 year old female right posterior headache and accompanying blurred vision next yield prior head CT 10/05/2011 EXAM: CT HEAD WITHOUT CONTRAST TECHNIQUE: Contiguous axial images were obtained from the base of the skull through the vertex without intravenous contrast. COMPARISON:  None. FINDINGS: Negative for acute intracranial hemorrhage, acute infarction, mass, mass effect, hydrocephalus or midline shift. Gray-white differentiation is preserved throughout. No acute soft tissue or calvarial abnormality. The globes and orbits are symmetric and unremarkable. Normal aeration of the mastoid air cells and visualized paranasal sinuses. IMPRESSION: Negative head CT. Electronically Signed   By: Jacqulynn Cadet M.D.   On: 07/11/2015 11:49   MDM: Case discussed with Dr. Katy Fitch via telephone. Moderate concern for temporal arteritis given mildly elevated sedimentation rate. He requests C-reactive protein. If elevated, he wishes to be called to evaluate patient in ED. If normal she can be sent home with pain medicine and follow up in the office in 2 days as outpatient  Chest xray viewed by me Pt signed out to Dr. Jeneen Rinks at 4pm    Diagnosis #1 headache #2 acute visual change #3 hemoptysis by history #4 cutaneous axis of left axilla Final diagnoses:  None        Orlie Dakin, MD 07/11/15 1645

## 2015-07-11 NOTE — ED Notes (Signed)
Pt reports posterior R HA that began yesterday am accompanied by R eye blurred vision, light sensitivity and sound sensitivity.

## 2015-07-11 NOTE — Discharge Instructions (Signed)
°  Call Dr. Katy Fitch Monday Morning for a same day appointment. Inform the office staff that you were ween in the ER on Saturday, and that the Er physician spoke with  Dr. Katy Fitch and yo are to be seen Monday.  General Headache Without Cause A headache is pain or discomfort felt around the head or neck area. The specific cause of a headache may not be found. There are many causes and types of headaches. A few common ones are:  Tension headaches.  Migraine headaches.  Cluster headaches.  Chronic daily headaches. HOME CARE INSTRUCTIONS  Watch your condition for any changes. Take these steps to help with your condition: Managing Pain  Take over-the-counter and prescription medicines only as told by your health care provider.  Lie down in a dark, quiet room when you have a headache.  If directed, apply ice to the head and neck area:  Put ice in a plastic bag.  Place a towel between your skin and the bag.  Leave the ice on for 20 minutes, 2-3 times per day.  Use a heating pad or hot shower to apply heat to the head and neck area as told by your health care provider.  Keep lights dim if bright lights bother you or make your headaches worse. Eating and Drinking  Eat meals on a regular schedule.  Limit alcohol use.  Decrease the amount of caffeine you drink, or stop drinking caffeine. General Instructions  Keep all follow-up visits as told by your health care provider. This is important.  Keep a headache journal to help find out what may trigger your headaches. For example, write down:  What you eat and drink.  How much sleep you get.  Any change to your diet or medicines.  Try massage or other relaxation techniques.  Limit stress.  Sit up straight, and do not tense your muscles.  Do not use tobacco products, including cigarettes, chewing tobacco, or e-cigarettes. If you need help quitting, ask your health care provider.  Exercise regularly as told by your health care  provider.  Sleep on a regular schedule. Get 7-9 hours of sleep, or the amount recommended by your health care provider. SEEK MEDICAL CARE IF:   Your symptoms are not helped by medicine.  You have a headache that is different from the usual headache.  You have nausea or you vomit.  You have a fever. SEEK IMMEDIATE MEDICAL CARE IF:   Your headache becomes severe.  You have repeated vomiting.  You have a stiff neck.  You have a loss of vision.  You have problems with speech.  You have pain in the eye or ear.  You have muscular weakness or loss of muscle control.  You lose your balance or have trouble walking.  You feel faint or pass out.  You have confusion.   This information is not intended to replace advice given to you by your health care provider. Make sure you discuss any questions you have with your health care provider.   Document Released: 03/07/2005 Document Revised: 11/26/2014 Document Reviewed: 06/30/2014 Elsevier Interactive Patient Education [employer].

## 2015-07-11 NOTE — ED Provider Notes (Signed)
Per Dr. Revonda Humphrey, and was to be discharged if her C-reactive protein was normal. 0.7. She will be directed to follow-up with ophthalmology on Monday. Pressure from her pain medicine. ER with acute changes.  Tanna Furry, MD 07/11/15 1736

## 2015-07-11 NOTE — ED Notes (Signed)
Wound cleaned and dressed by RN

## 2015-07-13 DIAGNOSIS — H25811 Combined forms of age-related cataract, right eye: Secondary | ICD-10-CM | POA: Diagnosis not present

## 2015-07-13 DIAGNOSIS — R51 Headache: Secondary | ICD-10-CM | POA: Diagnosis not present

## 2015-07-13 DIAGNOSIS — Z961 Presence of intraocular lens: Secondary | ICD-10-CM | POA: Diagnosis not present

## 2015-09-17 DIAGNOSIS — W57XXXA Bitten or stung by nonvenomous insect and other nonvenomous arthropods, initial encounter: Secondary | ICD-10-CM | POA: Diagnosis not present

## 2015-09-17 DIAGNOSIS — R51 Headache: Secondary | ICD-10-CM | POA: Diagnosis not present

## 2015-09-17 DIAGNOSIS — M79671 Pain in right foot: Secondary | ICD-10-CM | POA: Diagnosis not present

## 2016-03-08 DIAGNOSIS — M25571 Pain in right ankle and joints of right foot: Secondary | ICD-10-CM | POA: Diagnosis not present

## 2016-03-22 DIAGNOSIS — M25571 Pain in right ankle and joints of right foot: Secondary | ICD-10-CM | POA: Diagnosis not present

## 2016-04-18 ENCOUNTER — Encounter (HOSPITAL_COMMUNITY): Payer: Self-pay | Admitting: *Deleted

## 2016-04-18 ENCOUNTER — Ambulatory Visit (HOSPITAL_COMMUNITY)
Admission: EM | Admit: 2016-04-18 | Discharge: 2016-04-18 | Disposition: A | Discharge status: Home / Self Care | Payer: Medicare Other | Attending: Emergency Medicine | Admitting: Emergency Medicine

## 2016-04-18 DIAGNOSIS — R6889 Other general symptoms and signs: Secondary | ICD-10-CM

## 2016-04-18 DIAGNOSIS — R509 Fever, unspecified: Secondary | ICD-10-CM

## 2016-04-18 DIAGNOSIS — R05 Cough: Secondary | ICD-10-CM

## 2016-04-18 DIAGNOSIS — R059 Cough, unspecified: Secondary | ICD-10-CM

## 2016-04-18 MED ORDER — OSELTAMIVIR PHOSPHATE 75 MG PO CAPS: 75.0000 mg | ORAL_CAPSULE | Freq: Two times a day (BID) | ORAL | 0 refills | Status: DC

## 2016-04-18 MED ORDER — BENZONATATE 100 MG PO CAPS: 200.0000 mg | ORAL_CAPSULE | Freq: Three times a day (TID) | ORAL | 0 refills | Status: DC | PRN

## 2016-04-18 MED ORDER — TAMSULOSIN HCL 0.4 MG PO CAPS: 0.4000 mg | ORAL_CAPSULE | Freq: Every day | ORAL | 0 refills | Status: DC

## 2016-04-18 NOTE — ED Provider Notes (Signed)
CSN: [id number]     Arrival date & time 04/18/16  1623 History   First MD Initiated Contact with Patient 04/18/16 1718     Chief Complaint  Patient presents with  . Fever  . Cough  . Generalized Body Aches   (Consider location/radiation/quality/duration/timing/severity/associated sxs/prior Treatment) Patient c/o cough, fever, aches, and weakness.     The history is provided by the patient.  Fever  Max temp prior to arrival:  103 Temp source:  Subjective and oral Severity:  Severe Onset quality:  Sudden Duration:  1 day Timing:  Constant Progression:  Worsening Relieved by:  None tried Worsened by:  Exertion Ineffective treatments:  None tried Associated symptoms: chills and cough     Past Medical History:  Diagnosis Date  . CAD (coronary artery disease) CARDIOLOGIST-  DR Cristopher Peru--  LOV  2013   non-obs CAD by cath in 7/07 (mLAD 30-40%, EF 65%)  . DDD (degenerative disc disease), lumbar   . DJD (degenerative joint disease)    KNEES  . GERD (gastroesophageal reflux disease)   . History of mitral valve prolapse    PER PT REPORTED MVP  PER DR HARSHAW FROM CARDIAC CATH IN 1987 (APPROX)  . 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 Hyperlipidemia   . Hypertension   . Left nephrolithiasis    NON-OBSTRUCTIVE  . Liver, polycystic   . Lumbar radiculopathy   . Osteoporosis   . Right ureteral stone   . Type 2 diabetes mellitus ([place])    Past Surgical History:  Procedure Laterality Date  . CARDIAC CATHETERIZATION  10-17-2005   DR COOPER   NON-OBSTRUCTIVE CAD/   mLAD 30-40%/   NORMAL LVF/  EF 65%  . CARDIOVASCULAR STRESS TEST  04-12-2011  DR Carleene Overlie TAYLOR   LOW RISK STUDY/  NO ISCHEMIA/  EF 68%  . CATARACT EXTRACTION W/ INTRAOCULAR LENS IMPLANT Right 2003  . COLONOSCOPY W/ POLYPECTOMY  03-17-2011  . CYSTOSCOPY WITH URETEROSCOPY Right 06/14/2013   Procedure: RIGHT  URETEROSCOPY,ureteral and urethral dilitation;  Surgeon: Claybon Jabs, MD;  Location: [facility];  Service: Urology;  Laterality: Right;  . HEEL SPUR SURGERY Left 2003  . TOTAL ABDOMINAL HYSTERECTOMY W/ BILATERAL SALPINGOOPHORECTOMY  1978  . TRANSTHORACIC ECHOCARDIOGRAM  04-07-2011   MILD LVH/  MILD FOCAL BASAL SEPTAL HYPERTROPHY/  EF  55-60% /  GRADE I DIASTOLIC DYSFUNCTION   Family History  Problem Relation Age of Onset  . Cancer Mother     breast  . Cancer Father     colon  . Cancer Brother     ?lung   Social History  Substance Use Topics  . Smoking status: Never Smoker  . Smokeless tobacco: Never Used  . Alcohol use No   OB History    No data available     Review of Systems  Constitutional: Positive for chills and fever.  HENT: Negative.   Eyes: Negative.   Respiratory: Positive for cough.   Cardiovascular: Negative.   Gastrointestinal: Negative.   Endocrine: Negative.   Genitourinary: Negative.   Musculoskeletal: Negative.   Allergic/Immunologic: Negative.   Neurological: Negative.   Hematological: Negative.   Psychiatric/Behavioral: Negative.     Allergies  Darvocet [propoxyphene n-acetaminophen] and Zofran  Home Medications   Prior to Admission medications   Medication Sig Start Date End Date Taking? Authorizing Provider  acetaminophen (TYLENOL) 500 MG tablet Take 1,000 mg by mouth every 6 (six) hours  as needed for headache.   Yes Historical Provider, MD  albuterol (PROVENTIL HFA;VENTOLIN HFA) 108 (90 BASE) MCG/ACT inhaler Inhale 1-2 puffs into the lungs every 6 (six) hours as needed for wheezing or shortness of breath. 10/16/13  Yes Ezequiel Essex, MD  amLODipine (NORVASC) 10 MG tablet Take 10 mg by mouth every morning. 04/06/12  Yes Barton Dubois, MD  carvedilol (COREG) 6.25 MG tablet Take 1 tablet (6.25 mg total) by mouth 2 (two) times daily with a meal. 03/31/14  Yes Srikar Janna Arch, MD  sitaGLIPtin (JANUVIA) 100 MG tablet Take 100 mg by mouth daily.   Yes Historical Provider, MD  atorvastatin  (LIPITOR) 80 MG tablet Take 1 tablet (80 mg total) by mouth daily at 6 PM. Patient not taking: Reported on 07/11/2015 03/31/14   Bynum Bellows, MD  azithromycin (ZITHROMAX) 250 MG tablet Take 1 tablet (250 mg total) by mouth daily. Patient not taking: Reported on 07/11/2015 03/31/14   Bynum Bellows, MD  benzonatate (TESSALON) 100 MG capsule Take 2 capsules (200 mg total) by mouth 3 (three) times daily as needed for cough. 04/18/16   Lysbeth Penner, FNP  budesonide (PULMICORT) 0.25 MG/2ML nebulizer solution Take 2 mLs (0.25 mg total) by nebulization 2 (two) times daily. Patient not taking: Reported on 07/11/2015 03/31/14   Bynum Bellows, MD  butamben-tetracaine-benzocaine (CETACAINE) 04-22-12 % spray Apply 1 spray topically 3 x daily with food. Patient not taking: Reported on 07/11/2015 03/31/14   Bynum Bellows, MD  chlorpheniramine-HYDROcodone [facility] PENNKINETIC ER) 10-8 MG/5ML LQCR Take 5 mLs by mouth every 12 (twelve) hours as needed for cough. Patient not taking: Reported on 07/11/2015 10/08/13   Billy Fischer, MD  esomeprazole (NEXIUM) 40 MG capsule Take 1 capsule (40 mg total) by mouth 2 (two) times daily. Patient not taking: Reported on 07/11/2015 04/06/12   Barton Dubois, MD  guaiFENesin-codeine 100-10 MG/5ML syrup Take 5 mLs by mouth 3 (three) times daily as needed for cough. Patient not taking: Reported on 07/11/2015 10/16/13   Ezequiel Essex, MD  HYDROcodone-acetaminophen (NORCO/VICODIN) 5-325 MG tablet Take 2 tablets by mouth every 4 (four) hours as needed. 07/11/15   Tanna Furry, MD  ipratropium (ATROVENT) 0.06 % nasal spray Place 2 sprays into both nostrils 4 (four) times daily. Patient not taking: Reported on 07/11/2015 10/08/13   Billy Fischer, MD  ipratropium-albuterol (DUONEB) 0.5-2.5 (3) MG/3ML SOLN Take 3 mLs by nebulization every 6 (six) hours as needed (Shortness of breath). Patient not taking: Reported on 07/11/2015 03/31/14   Bynum Bellows, MD  lisinopril (PRINIVIL,ZESTRIL) 5 MG  tablet Take 1 tablet (5 mg total) by mouth daily. Patient not taking: Reported on 07/11/2015 03/31/14   Bynum Bellows, MD  oseltamivir (TAMIFLU) 75 MG capsule Take 1 capsule (75 mg total) by mouth every 12 (twelve) hours. 04/18/16   Lysbeth Penner, FNP  predniSONE (DELTASONE) 10 MG tablet Take 1 tablet (10 mg total) by mouth daily with breakfast. Patient not taking: Reported on 07/11/2015 03/31/14   Bynum Bellows, MD  tamsulosin (FLOMAX) 0.4 MG CAPS capsule Take 1 capsule (0.4 mg total) by mouth daily. Patient not taking: Reported on 07/11/2015 06/05/13   Sherwood Gambler, MD  UNABLE TO FIND Please excuse patient from any work complication from [id number] until 04/04/2014, as patient was hospitalized. Patient not taking: Reported on 07/11/2015 03/31/14   Bynum Bellows, MD   Meds Ordered and Administered this Visit  Medications - No data to display  BP  131/69 (BP Location: Left Arm)   Pulse 85   Temp 100.4 F (38 C) (Oral)   Resp 20   SpO2 98%  No data found.   Physical Exam  Constitutional: She appears well-developed and well-nourished.  HENT:  Head: Normocephalic and atraumatic.  Right Ear: External ear normal.  Left Ear: External ear normal.  Mouth/Throat: Oropharynx is clear and moist.  Eyes: Conjunctivae and EOM are normal. Pupils are equal, round, and reactive to light.  Neck: Normal range of motion. Neck supple.  Cardiovascular: Normal rate, regular rhythm and normal heart sounds.   Pulmonary/Chest: Effort normal and breath sounds normal.  Abdominal: Soft. Bowel sounds are normal.  Nursing note and vitals reviewed.   Urgent Care Course     Procedures (including critical care time)  Labs Review Labs Reviewed - No data to display  Imaging Review No results found.   Visual Acuity Review  Right Eye Distance:   Left Eye Distance:   Bilateral Distance:    Right Eye Near:   Left Eye Near:    Bilateral Near:         MDM   1. Flu-like symptoms   2. Cough    Tylenol 975mg  po now Oral rehydration Tamiflu 75mg  one po bid x 5 days #10 Tessalon Perles 200mg  one po tid #6 Push po fluids, rest, tylenol and motrin otc prn as directed for fever, arthralgias, and myalgias.  Follow up prn if sx's continue or persist.   Lysbeth Penner, FNP 04/18/16 [place], FNP 04/18/16 [place], FNP 04/18/16 [phone]

## 2016-04-18 NOTE — ED Triage Notes (Signed)
Patient reports fever, cough, and generalized body aches started yesterday. Last dose of antipyretic at noon.

## 2016-04-28 ENCOUNTER — Emergency Department (HOSPITAL_COMMUNITY)
Admission: EM | Admit: 2016-04-28 | Discharge: 2016-04-28 | Disposition: A | Discharge status: Home / Self Care | Payer: Medicare Other | Attending: Emergency Medicine | Admitting: Emergency Medicine

## 2016-04-28 ENCOUNTER — Encounter (HOSPITAL_COMMUNITY): Payer: Self-pay

## 2016-04-28 ENCOUNTER — Emergency Department (HOSPITAL_COMMUNITY): Payer: Medicare Other

## 2016-04-28 DIAGNOSIS — Z79899 Other long term (current) drug therapy: Secondary | ICD-10-CM | POA: Insufficient documentation

## 2016-04-28 DIAGNOSIS — E119 Type 2 diabetes mellitus without complications: Secondary | ICD-10-CM | POA: Insufficient documentation

## 2016-04-28 DIAGNOSIS — I5030 Unspecified diastolic (congestive) heart failure: Secondary | ICD-10-CM | POA: Insufficient documentation

## 2016-04-28 DIAGNOSIS — I2510 Atherosclerotic heart disease of native coronary artery without angina pectoris: Secondary | ICD-10-CM | POA: Diagnosis not present

## 2016-04-28 DIAGNOSIS — I110 Hypertensive heart disease with heart failure: Secondary | ICD-10-CM | POA: Diagnosis not present

## 2016-04-28 DIAGNOSIS — I10 Essential (primary) hypertension: Secondary | ICD-10-CM | POA: Diagnosis not present

## 2016-04-28 DIAGNOSIS — J111 Influenza due to unidentified influenza virus with other respiratory manifestations: Secondary | ICD-10-CM | POA: Diagnosis not present

## 2016-04-28 DIAGNOSIS — R05 Cough: Secondary | ICD-10-CM | POA: Diagnosis not present

## 2016-04-28 DIAGNOSIS — R69 Illness, unspecified: Secondary | ICD-10-CM

## 2016-04-28 DIAGNOSIS — R079 Chest pain, unspecified: Secondary | ICD-10-CM | POA: Diagnosis not present

## 2016-04-28 LAB — CBG MONITORING, ED: Glucose-Capillary: 95 mg/dL (ref 65–99)

## 2016-04-28 MED ORDER — ALBUTEROL SULFATE HFA 108 (90 BASE) MCG/ACT IN AERS
2.0000 | INHALATION_SPRAY | RESPIRATORY_TRACT | Status: DC | PRN
  Administered 2016-04-28: 2 via RESPIRATORY_TRACT
  Filled 2016-04-28: qty 6.7

## 2016-04-28 MED ORDER — ACETAMINOPHEN 325 MG PO TABS
650.0000 mg | ORAL_TABLET | Freq: Once | ORAL | Status: AC
  Administered 2016-04-28: 650 mg via ORAL
  Filled 2016-04-28: qty 2

## 2016-04-28 MED ORDER — BENZONATATE 100 MG PO CAPS: 100.0000 mg | ORAL_CAPSULE | Freq: Three times a day (TID) | ORAL | 0 refills | Status: DC

## 2016-04-28 NOTE — Discharge Instructions (Signed)
Take Tylenol as directed for pain every 4 hours. You've been prescribed cough medicine. Use your albuterol inhaler 2 puffs every 4 hours as needed for cough or shortness of breath. See your primary care physician if not feeling better by next week. Your blood pressure should be rechecked that your primary care physician's office in one week. Today's was elevated at 197/89

## 2016-04-28 NOTE — ED Provider Notes (Signed)
[facility] DEPT Provider Note   CSN: [id number] Arrival date & time: 04/28/16  1651     History   Chief Complaint Chief Complaint  Patient presents with  . Cough  . Back Pain    HPI Brenda Flores is a 71 y.o. female.Patient with cough and upper back pain worse with coughing for the past 1.5 weeks. She also complains of mild sore throat and hoarse voice. No fever. She's been treated with Tamiflu, without relief. Other associated symptoms include one episode of diarrhea today and "sinus headache". Meaning pressure in her face no vomiting. No other associated symptoms.  HPI  Past Medical History:  Diagnosis Date  . CAD (coronary artery disease) CARDIOLOGIST-  DR Cristopher Peru--  LOV  2013   non-obs CAD by cath in 7/07 (mLAD 30-40%, EF 65%)  . DDD (degenerative disc disease), lumbar   . DJD (degenerative joint disease)    KNEES  . GERD (gastroesophageal reflux disease)   . History of mitral valve prolapse    PER PT REPORTED MVP  PER DR HARSHAW FROM CARDIAC CATH IN 1987 (APPROX)  . 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 Hyperlipidemia   . Hypertension   . Left nephrolithiasis    NON-OBSTRUCTIVE  . Liver, polycystic   . Lumbar radiculopathy   . Osteoporosis   . Right ureteral stone   . Type 2 diabetes mellitus [facility])     Patient Active Problem List   Diagnosis Date Noted  . Status post nail surgery 01/27/2015  . Ingrown nail 01/12/2015  . Achilles tendonitis 01/12/2015  . Equinus deformity of foot, acquired 01/12/2015  . Calcaneal spur 01/12/2015  . Pain in the chest   . Viral upper respiratory tract infection   . Cough   . Essential hypertension   . Diabetes type 2, controlled ([place])   . HLD (hyperlipidemia)   . Chest pain 03/28/2014  . Right ureteral stone 06/14/2013  . History of mitral valve prolapse 04/05/2012  . 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 Liver cyst   . Diastolic heart failure,  NYHA class 1 ([place]) 04/08/2011  . Dyslipidemia 04/07/2011  . Lumbar radiculopathy 04/07/2011  . CAD in native artery 04/07/2011  . Lumbar disc disease 04/07/2011  . Osteoarthritis of left knee 04/07/2011  . Chest pain 04/06/2011  . Hypertension 04/06/2011  . Headache(784.0) 04/06/2011  . Leg pain, left 04/06/2011    Past Surgical History:  Procedure Laterality Date  . CARDIAC CATHETERIZATION  10-17-2005   DR COOPER   NON-OBSTRUCTIVE CAD/   mLAD 30-40%/   NORMAL LVF/  EF 65%  . CARDIOVASCULAR STRESS TEST  04-12-2011  DR Carleene Overlie TAYLOR   LOW RISK STUDY/  NO ISCHEMIA/  EF 68%  . CATARACT EXTRACTION W/ INTRAOCULAR LENS IMPLANT Right 2003  . COLONOSCOPY W/ POLYPECTOMY  03-17-2011  . CYSTOSCOPY WITH URETEROSCOPY Right 06/14/2013   Procedure: RIGHT  URETEROSCOPY,ureteral and urethral dilitation;  Surgeon: Claybon Jabs, MD;  Location: [facility];  Service: Urology;  Laterality: Right;  . HEEL SPUR SURGERY Left 2003  . TOTAL ABDOMINAL HYSTERECTOMY W/ BILATERAL SALPINGOOPHORECTOMY  1978  . TRANSTHORACIC ECHOCARDIOGRAM  04-07-2011   MILD LVH/  MILD FOCAL BASAL SEPTAL HYPERTROPHY/  EF  55-60% /  GRADE I DIASTOLIC DYSFUNCTION    OB History    No data available       Home Medications    Prior to Admission medications  Medication Sig Start Date End Date Taking? Authorizing Provider  acetaminophen (TYLENOL) 500 MG tablet Take 2,000 mg by mouth every 6 (six) hours as needed for mild pain, moderate pain, fever or headache.    Yes Historical Provider, MD  albuterol (PROVENTIL HFA;VENTOLIN HFA) 108 (90 BASE) MCG/ACT inhaler Inhale 1-2 puffs into the lungs every 6 (six) hours as needed for wheezing or shortness of breath. 10/16/13  Yes Ezequiel Essex, MD  amLODipine (NORVASC) 10 MG tablet Take 10 mg by mouth at bedtime.    Yes Barton Dubois, MD  carvedilol (COREG) 6.25 MG tablet Take 1 tablet (6.25 mg total) by mouth 2 (two) times daily with a meal. 03/31/14  Yes Srikar Janna Arch, MD    sitaGLIPtin (JANUVIA) 100 MG tablet Take 100 mg by mouth at bedtime.    Yes Historical Provider, MD    Family History Family History  Problem Relation Age of Onset  . Cancer Mother     breast  . Cancer Father     colon  . Cancer Brother     ?lung    Social History Social History  Substance Use Topics  . Smoking status: Never Smoker  . Smokeless tobacco: Never Used  . Alcohol use No     Allergies   Darvocet [propoxyphene n-acetaminophen] and Zofran   Review of Systems Review of Systems  Constitutional: Negative.   HENT: Positive for sore throat.   Respiratory: Positive for cough.   Cardiovascular: Negative.   Gastrointestinal: Negative.   Musculoskeletal: Positive for back pain.  Skin: Negative.   Allergic/Immunologic: Positive for immunocompromised state.       Diabetic  Neurological: Positive for headaches.  Psychiatric/Behavioral: Negative.   All other systems reviewed and are negative.    Physical Exam Updated Vital Signs BP 197/89 (BP Location: Right Arm)   Pulse 61   Temp 98 F (36.7 C) (Oral)   Resp 16   SpO2 96%   Physical Exam  Constitutional: She is oriented to person, place, and time. She appears well-developed and well-nourished. No distress.  HENT:  Head: Normocephalic and atraumatic.  Right Ear: External ear normal.  Left Ear: External ear normal.  Bilateral tympanic membranes normal. Oral pharynx minimally reddened  Eyes: Conjunctivae are normal. Pupils are equal, round, and reactive to light.  Neck: Neck supple. No tracheal deviation present. No thyromegaly present.  Cardiovascular: Normal rate and regular rhythm.   No murmur heard. Pulmonary/Chest: Effort normal and breath sounds normal.  Occasional cough  Abdominal: Soft. Bowel sounds are normal. She exhibits no distension. There is no tenderness.  Musculoskeletal: Normal range of motion. She exhibits no edema or tenderness.  Neurological: She is alert and oriented to person,  place, and time. Coordination normal.  Skin: Skin is warm and dry. No rash noted.  Psychiatric: She has a normal mood and affect.  Nursing note and vitals reviewed.    ED Treatments / Results  Labs (all labs ordered are listed, but only abnormal results are displayed) Labs Reviewed - No data to display  EKG  EKG Interpretation None       Radiology Dg Chest 2 View  Result Date: 04/28/2016 CLINICAL DATA:  Dry cough, upper chest pain EXAM: CHEST  2 VIEW COMPARISON:  07/11/2015 FINDINGS: Borderline enlargement of cardiac silhouette. Mediastinal contours and pulmonary vascularity normal. Lungs clear. No pleural effusion or pneumothorax. Focal eventration RIGHT diaphragm unchanged. Mild degenerative disc disease changes mid thoracic spine. IMPRESSION: No acute abnormalities. Electronically Signed   By: Elta Guadeloupe  Thornton Papas M.D.   On: 04/28/2016 17:34    Procedures Procedures (including critical care time)  Medications Ordered in ED Medications  albuterol (PROVENTIL HFA;VENTOLIN HFA) 108 (90 Base) MCG/ACT inhaler 2 puff (2 puffs Inhalation Given 04/28/16 2135)  acetaminophen (TYLENOL) tablet 650 mg (not administered)     Initial Impression / Assessment and Plan / ED Course  I have reviewed the triage vital signs and the nursing notes.  Pertinent labs & imaging results that were available during my care of the patient were reviewed by me and considered in my medical decision making (see chart for details).     Chest x-ray viewed by me. Plan patient be given albuterol HFA with spacer. Prescription Tessalon Perles. Tylenol for aches. Follow-up with PMD for reexamination next week. Blood pressure recheck one week Results for orders placed or performed during the hospital encounter of 04/28/16  CBG monitoring, ED  Result Value Ref Range   Glucose-Capillary 95 65 - 99 mg/dL   Comment 1 Document in Chart    Dg Chest 2 View  Result Date: 04/28/2016 CLINICAL DATA:  Dry cough, upper chest pain  EXAM: CHEST  2 VIEW COMPARISON:  07/11/2015 FINDINGS: Borderline enlargement of cardiac silhouette. Mediastinal contours and pulmonary vascularity normal. Lungs clear. No pleural effusion or pneumothorax. Focal eventration RIGHT diaphragm unchanged. Mild degenerative disc disease changes mid thoracic spine. IMPRESSION: No acute abnormalities. Electronically Signed   By: Lavonia Dana M.D.   On: 04/28/2016 17:34   Final Clinical Impressions(s) / ED Diagnoses  Diagnosis influenza-like illness #2 elevated blood pressure Final diagnoses:  None    New Prescriptions New Prescriptions   No medications on file     Orlie Dakin, MD 04/28/16 2214

## 2016-04-28 NOTE — ED Triage Notes (Signed)
Pt was told she had flu x 1 week ago.  Pt was given tamilflu and tessalon. Pt continues to cough.  Pain in back and chest with the cough.  Laryngitis.  Diarrhea.  Some fever yesterday.  No sore throat.

## 2016-05-10 ENCOUNTER — Ambulatory Visit: Payer: Medicare Other | Admitting: Internal Medicine

## 2016-05-11 ENCOUNTER — Ambulatory Visit (INDEPENDENT_AMBULATORY_CARE_PROVIDER_SITE_OTHER): Payer: Medicare Other | Admitting: Primary Care

## 2016-05-11 ENCOUNTER — Encounter: Payer: Self-pay | Admitting: Primary Care

## 2016-05-11 VITALS — BP 130/82 | HR 67 | Temp 98.1°F | Ht 65.0 in | Wt 182.8 lb

## 2016-05-11 DIAGNOSIS — I10 Essential (primary) hypertension: Secondary | ICD-10-CM | POA: Diagnosis not present

## 2016-05-11 DIAGNOSIS — E785 Hyperlipidemia, unspecified: Secondary | ICD-10-CM | POA: Diagnosis not present

## 2016-05-11 DIAGNOSIS — I2510 Atherosclerotic heart disease of native coronary artery without angina pectoris: Secondary | ICD-10-CM

## 2016-05-11 DIAGNOSIS — J069 Acute upper respiratory infection, unspecified: Secondary | ICD-10-CM | POA: Diagnosis not present

## 2016-05-11 DIAGNOSIS — E119 Type 2 diabetes mellitus without complications: Secondary | ICD-10-CM

## 2016-05-11 LAB — LIPID PANEL
CHOLESTEROL: 229 mg/dL — AB (ref 0–200)
HDL: 48.2 mg/dL (ref >39.00)
LDL Cholesterol: 149 mg/dL — ABNORMAL HIGH (ref 0–99)
NonHDL: 181.12
TRIGLYCERIDES: 163 mg/dL — AB (ref 0.0–149.0)
Total CHOL/HDL Ratio: 5
VLDL: 32.6 mg/dL (ref 0.0–40.0)

## 2016-05-11 LAB — HEMOGLOBIN A1C: Hgb A1c MFr Bld: 6.3 % (ref 4.6–6.5)

## 2016-05-11 LAB — MICROALBUMIN / CREATININE URINE RATIO
CREATININE, U: 666.2 mg/dL
MICROALB/CREAT RATIO: 1.6 mg/g (ref 0.0–30.0)
Microalb, Ur: 10.5 mg/dL — ABNORMAL HIGH (ref 0.0–1.9)

## 2016-05-11 LAB — COMPREHENSIVE METABOLIC PANEL
ALK PHOS: 59 U/L (ref 39–117)
ALT: 13 U/L (ref 0–35)
AST: 16 U/L (ref 0–37)
Albumin: 4 g/dL (ref 3.5–5.2)
BUN: 14 mg/dL (ref 6–23)
CHLORIDE: 107 meq/L (ref 96–112)
CO2: 32 meq/L (ref 19–32)
Calcium: 9.2 mg/dL (ref 8.4–10.5)
Creatinine, Ser: 1.06 mg/dL (ref 0.40–1.20)
GFR: 66.03 mL/min (ref >60.00)
GLUCOSE: 84 mg/dL (ref 70–99)
Potassium: 3.8 mEq/L (ref 3.5–5.1)
SODIUM: 143 meq/L (ref 135–145)
TOTAL PROTEIN: 7 g/dL (ref 6.0–8.3)
Total Bilirubin: 0.3 mg/dL (ref 0.2–1.2)

## 2016-05-11 MED ORDER — AZITHROMYCIN 250 MG PO TABS: ORAL_TABLET | ORAL | 0 refills | Status: DC

## 2016-05-11 NOTE — Assessment & Plan Note (Signed)
Reviewed prior records. She is asymptomatic. Encouraged healthy lifestyle.

## 2016-05-11 NOTE — Assessment & Plan Note (Signed)
Stable in clinic today, continue Norvasc and Coreg.

## 2016-05-11 NOTE — Patient Instructions (Signed)
Complete lab work prior to leaving today. I will notify you of your results once received.   Start Azithromycin antibiotics. Take 2 tablets by mouth today, then 1 tablet daily for 4 additional days.  Continue the albuterol inhaler, Delsym/Robitussin as needed for cough.  Ensure you are staying hydrated with fluids.  Please schedule a physical with me in 6 months. You may also schedule a lab only appointment 3-4 days prior. We will discuss your lab results in detail during your physical.  It was a pleasure to meet you today! Please don't hesitate to call me with any questions. Welcome to [facility]!

## 2016-05-11 NOTE — Assessment & Plan Note (Signed)
Lipids pending today, not on statin treatment. Will likely need statin treatment given history of CAD, diabetes, HTN. Will await labs.

## 2016-05-11 NOTE — Assessment & Plan Note (Signed)
Managed on Januvia. Check A1C and urine microalbumin today. Continue current regimen for now.

## 2016-05-11 NOTE — Progress Notes (Signed)
Subjective:    Patient ID: Brenda Flores, female    DOB: 1945/08/29, 71 y.o.   MRN: [id number]  HPI  Brenda Flores is a 71 year old female who presents today to establish care and discuss the problems mentioned below. Will obtain old records.  1) Type 2 Diabetes: Diagnosed 2 years ago. Currently managed on Januvia 100 mg. Her last A1C was checked over 1 year ago. She denies numbness/tingling, dizziness. She does experience nocturia, she takes her BP medications at night.  2) Essential Hypertension: Currently managed on amlodipine 10 mg and carvedilol 6.25 mg BID. She presented to the emergency department in early February 2018 for flu-like symptoms and was noticed to have a BP of 197/89. Her BP today is 130/82. She restarted her BP meds 2 months ago. She denies chest pain, lower extremity edema, dizziness.  3) CAD/Tricuspid Valve Regurgitation: Echocardiogram in 2016 with EF of 65-70%, mild tricuspid valve regurgitation. Nuclear stress test in 2016 with no acute ST or t-wave changes, no reversible ischemia or infarction. Cardiac catheterization in 2007 with 30-40% blockage. She does not follow with cardiology.   4) Cough: Present for the past 4 weeks. She was initially seen at Urgent Care in late January, diagnosed with the flu, treated with Tamiflu. She was evaluated again at [facility] in early February with same symptoms. She underwent chest xray in early February which was clear. She was provided with an albuterol inhaler and tessalon pearls, and advised to follow up with PCP.   Since her ED and UC visits she's experiencing fatigue, shortness of breath. Her cough is non productive and is more persistent. She denies fevers. She's been taking Mucinex, Delsym, and sugar free Robitussin without much improvement.   Review of Systems  Constitutional: Positive for chills and fatigue. Negative for fever.  HENT: Positive for congestion.   Eyes: Negative for visual disturbance.  Respiratory:  Positive for cough and shortness of breath. Negative for wheezing.   Cardiovascular: Negative for chest pain.  Neurological: Negative for dizziness, numbness and headaches.       Past Medical History:  Diagnosis Date  . CAD (coronary artery disease) CARDIOLOGIST-  DR Cristopher Peru--  LOV  2013   non-obs CAD by cath in 7/07 (mLAD 30-40%, EF 65%)  . DDD (degenerative disc disease), lumbar   . DJD (degenerative joint disease)    KNEES  . GERD (gastroesophageal reflux disease)   . History of mitral valve prolapse    PER PT REPORTED MVP  PER DR HARSHAW FROM CARDIAC CATH IN 1987 (APPROX)  . 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 Hyperlipidemia   . Hypertension   . Left nephrolithiasis    NON-OBSTRUCTIVE  . Liver, polycystic   . Lumbar radiculopathy   . Osteoporosis   . Right ureteral stone   . Type 2 diabetes mellitus ([place])      Social History   Social History  . Marital status: Widowed    Spouse name: N/A  . Number of children: N/A  . Years of education: N/A   Occupational History  . Not on file.   Social History Main Topics  . Smoking status: Never Smoker  . Smokeless tobacco: Never Used  . Alcohol use No  . Drug use: No  . Sexual activity: Not on file   Other Topics Concern  . Not on file   Social History Narrative  . No narrative on file  Past Surgical History:  Procedure Laterality Date  . CARDIAC CATHETERIZATION  10-17-2005   DR COOPER   NON-OBSTRUCTIVE CAD/   mLAD 30-40%/   NORMAL LVF/  EF 65%  . CARDIOVASCULAR STRESS TEST  04-12-2011  DR Carleene Overlie TAYLOR   LOW RISK STUDY/  NO ISCHEMIA/  EF 68%  . CATARACT EXTRACTION W/ INTRAOCULAR LENS IMPLANT Right 2003  . COLONOSCOPY W/ POLYPECTOMY  03-17-2011  . CYSTOSCOPY WITH URETEROSCOPY Right 06/14/2013   Procedure: RIGHT  URETEROSCOPY,ureteral and urethral dilitation;  Surgeon: Claybon Jabs, MD;  [facility];  Service: Urology;   Laterality: Right;  . HEEL SPUR SURGERY Left 2003  . TOTAL ABDOMINAL HYSTERECTOMY W/ BILATERAL SALPINGOOPHORECTOMY  1978  . TRANSTHORACIC ECHOCARDIOGRAM  04-07-2011   MILD LVH/  MILD FOCAL BASAL SEPTAL HYPERTROPHY/  EF  55-60% /  GRADE I DIASTOLIC DYSFUNCTION    Family History  Problem Relation Age of Onset  . Cancer Mother     breast  . Cancer Father     colon  . Cancer Brother     ?lung    Allergies  Allergen Reactions  . Darvocet [Propoxyphene N-Acetaminophen] Nausea And Vomiting  . Zofran Itching    Current Outpatient Prescriptions on File Prior to Visit  Medication Sig Dispense Refill  . albuterol (PROVENTIL HFA;VENTOLIN HFA) 108 (90 BASE) MCG/ACT inhaler Inhale 1-2 puffs into the lungs every 6 (six) hours as needed for wheezing or shortness of breath. 1 Inhaler 0  . amLODipine (NORVASC) 10 MG tablet Take 10 mg by mouth at bedtime.     . carvedilol (COREG) 6.25 MG tablet Take 1 tablet (6.25 mg total) by mouth 2 (two) times daily with a meal. 30 tablet 0  . sitaGLIPtin (JANUVIA) 100 MG tablet Take 100 mg by mouth at bedtime.      No current facility-administered medications on file prior to visit.     BP 130/82   Pulse 67   Temp 98.1 F (36.7 C) (Oral)   Ht 5\' 5"  (1.651 m)   Wt 182 lb 12.8 oz (82.9 kg)   SpO2 98%   BMI 30.42 kg/m    Objective:   Physical Exam  Constitutional: She appears well-nourished. She appears ill.  HENT:  Right Ear: Tympanic membrane and ear canal normal.  Left Ear: Tympanic membrane and ear canal normal.  Nose: Right sinus exhibits no maxillary sinus tenderness and no frontal sinus tenderness. Left sinus exhibits no maxillary sinus tenderness and no frontal sinus tenderness.  Mouth/Throat: Oropharynx is clear and moist.  Eyes: Conjunctivae are normal.  Neck: Neck supple.  Cardiovascular: Normal rate and regular rhythm.   Pulmonary/Chest: Effort normal and breath sounds normal. She has no wheezes. She has no rales.  Persistent dry  cough during exam  Lymphadenopathy:    She has no cervical adenopathy.  Skin: Skin is warm and dry.          Assessment & Plan:  URI vs Acute Bronchitis:  Cough, fatigue x 1 month. No improvement with Tamiflu or conservative treatment. Exam today with persistent cough, no obvious consolidation. Given duration of symptoms without improvement with concervative treatment, will treat. Rx for Zpak sent to pharmacy. Continue OTC cough suppressant. Fluids, rest, follow up PRN.

## 2016-05-11 NOTE — Progress Notes (Signed)
Pre visit review using our clinic review tool, if applicable. No additional management support is needed unless otherwise documented below in the visit note. 

## 2016-05-17 ENCOUNTER — Other Ambulatory Visit: Payer: Self-pay | Admitting: Primary Care

## 2016-05-17 DIAGNOSIS — R809 Proteinuria, unspecified: Secondary | ICD-10-CM

## 2016-05-17 DIAGNOSIS — E1129 Type 2 diabetes mellitus with other diabetic kidney complication: Secondary | ICD-10-CM

## 2016-05-17 DIAGNOSIS — E785 Hyperlipidemia, unspecified: Secondary | ICD-10-CM

## 2016-05-17 MED ORDER — SIMVASTATIN 20 MG PO TABS: 20.0000 mg | ORAL_TABLET | Freq: Every day | ORAL | 3 refills | Status: DC

## 2016-05-17 MED ORDER — LISINOPRIL 5 MG PO TABS: 5.0000 mg | ORAL_TABLET | Freq: Every day | ORAL | 3 refills | Status: DC

## 2016-05-20 ENCOUNTER — Encounter (HOSPITAL_BASED_OUTPATIENT_CLINIC_OR_DEPARTMENT_OTHER): Payer: Self-pay | Admitting: *Deleted

## 2016-05-23 ENCOUNTER — Encounter (HOSPITAL_BASED_OUTPATIENT_CLINIC_OR_DEPARTMENT_OTHER)
Admission: RE | Admit: 2016-05-23 | Discharge: 2016-05-23 | Disposition: A | Discharge status: Home / Self Care | Payer: Medicare Other | Source: Ambulatory Visit | Attending: Orthopedic Surgery | Admitting: Orthopedic Surgery

## 2016-05-23 ENCOUNTER — Encounter (HOSPITAL_BASED_OUTPATIENT_CLINIC_OR_DEPARTMENT_OTHER): Payer: Self-pay | Admitting: *Deleted

## 2016-05-23 DIAGNOSIS — M21961 Unspecified acquired deformity of right lower leg: Secondary | ICD-10-CM | POA: Diagnosis not present

## 2016-05-23 DIAGNOSIS — I10 Essential (primary) hypertension: Secondary | ICD-10-CM | POA: Diagnosis not present

## 2016-05-23 DIAGNOSIS — E119 Type 2 diabetes mellitus without complications: Secondary | ICD-10-CM | POA: Diagnosis not present

## 2016-05-23 DIAGNOSIS — M66361 Spontaneous rupture of flexor tendons, right lower leg: Secondary | ICD-10-CM | POA: Diagnosis not present

## 2016-05-23 DIAGNOSIS — K219 Gastro-esophageal reflux disease without esophagitis: Secondary | ICD-10-CM | POA: Diagnosis not present

## 2016-05-23 DIAGNOSIS — I2510 Atherosclerotic heart disease of native coronary artery without angina pectoris: Secondary | ICD-10-CM | POA: Diagnosis not present

## 2016-05-23 DIAGNOSIS — M1990 Unspecified osteoarthritis, unspecified site: Secondary | ICD-10-CM | POA: Diagnosis not present

## 2016-05-23 NOTE — H&P (Signed)
Pleasant 71 year old female who presents to our clinic today with right heel pain.  She states this has been ongoing for the past year and has significantly worsened over the past three months.  All of her pain is over the distal Achilles.  She describes this as constant and stabbing in nature.  Walking as well as any pressure on her heel seems to make it worse.  The only way she gets any relief is resting her foot in the air barefoot.  She has tried Advil and Tylenol without relief of symptoms.  Of note, she does have a history of left heel Haglund's deformity with excision of os calcis with debridement and primary repair of Achilles tendon several years back.  She states this feels very similar.  She did have a boot leftover from this which she has worn at home which does help a little.  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    Past Medical, Family and Social History reviewed in detail on patient questionnaire and signed.   Review of systems as detailed on HPI; all others reviewed and are negative.    EXAMINATION: Well-developed, well-nourished female in no acute distress.  Alert and oriented x 3.  Examination of her right foot reveals marked pain and swelling over the distal Achilles.  Some tenderness at the pre-Achilles bursa.  No tenderness over the plantar fascial insertion.  She can dorsiflex her heel to about 20 degrees.  She is neurovascularly intact distally.  X-RAYS: X-rays reveal a marked retrocalcaneal spur.   IMPRESSION: Right foot Haglund's deformity.  PLAN: At this point, there is nothing short of right foot excision os calcis debridement and then primary repair of Achilles tendon.  We are going to go ahead and fill out paperwork to proceed with this.  Risks, benefits, and possible complications reviewed.  Rehab and recovery time discussed.  All questions were answered.  Paperwork completed.  Of note, Sherrion lives alone and will  likely need home health assistance the first 7-10 days following surgery.

## 2016-05-24 ENCOUNTER — Other Ambulatory Visit: Payer: Self-pay | Admitting: *Deleted

## 2016-05-24 MED ORDER — CARVEDILOL 6.25 MG PO TABS: 6.2500 mg | ORAL_TABLET | Freq: Two times a day (BID) | ORAL | 3 refills | Status: DC

## 2016-05-24 MED ORDER — CARVEDILOL 6.25 MG PO TABS
6.2500 mg | ORAL_TABLET | Freq: Two times a day (BID) | ORAL | 3 refills | Status: DC
Start: 2016-05-24 — End: 2016-12-21

## 2016-05-26 ENCOUNTER — Encounter (HOSPITAL_BASED_OUTPATIENT_CLINIC_OR_DEPARTMENT_OTHER): Payer: Self-pay | Admitting: Anesthesiology

## 2016-05-26 ENCOUNTER — Encounter (HOSPITAL_BASED_OUTPATIENT_CLINIC_OR_DEPARTMENT_OTHER)
Admission: RE | Disposition: A | Discharge status: Home / Self Care | Payer: Self-pay | Source: Ambulatory Visit | Attending: Orthopedic Surgery

## 2016-05-26 ENCOUNTER — Ambulatory Visit (HOSPITAL_BASED_OUTPATIENT_CLINIC_OR_DEPARTMENT_OTHER)
Admission: RE | Admit: 2016-05-26 | Discharge: 2016-05-26 | Disposition: A | Discharge status: Home / Self Care | Payer: Medicare Other | Source: Ambulatory Visit | Attending: Orthopedic Surgery | Admitting: Orthopedic Surgery

## 2016-05-26 ENCOUNTER — Ambulatory Visit (HOSPITAL_BASED_OUTPATIENT_CLINIC_OR_DEPARTMENT_OTHER): Payer: Medicare Other | Admitting: Anesthesiology

## 2016-05-26 DIAGNOSIS — M21961 Unspecified acquired deformity of right lower leg: Secondary | ICD-10-CM | POA: Insufficient documentation

## 2016-05-26 DIAGNOSIS — I10 Essential (primary) hypertension: Secondary | ICD-10-CM | POA: Insufficient documentation

## 2016-05-26 DIAGNOSIS — G8918 Other acute postprocedural pain: Secondary | ICD-10-CM | POA: Diagnosis not present

## 2016-05-26 DIAGNOSIS — I2510 Atherosclerotic heart disease of native coronary artery without angina pectoris: Secondary | ICD-10-CM | POA: Insufficient documentation

## 2016-05-26 DIAGNOSIS — M66361 Spontaneous rupture of flexor tendons, right lower leg: Secondary | ICD-10-CM | POA: Diagnosis not present

## 2016-05-26 DIAGNOSIS — M25774 Osteophyte, right foot: Secondary | ICD-10-CM | POA: Diagnosis not present

## 2016-05-26 DIAGNOSIS — M5416 Radiculopathy, lumbar region: Secondary | ICD-10-CM | POA: Diagnosis not present

## 2016-05-26 DIAGNOSIS — E119 Type 2 diabetes mellitus without complications: Secondary | ICD-10-CM | POA: Insufficient documentation

## 2016-05-26 DIAGNOSIS — M1990 Unspecified osteoarthritis, unspecified site: Secondary | ICD-10-CM | POA: Diagnosis not present

## 2016-05-26 DIAGNOSIS — E785 Hyperlipidemia, unspecified: Secondary | ICD-10-CM | POA: Diagnosis not present

## 2016-05-26 DIAGNOSIS — K219 Gastro-esophageal reflux disease without esophagitis: Secondary | ICD-10-CM | POA: Insufficient documentation

## 2016-05-26 DIAGNOSIS — M66871 Spontaneous rupture of other tendons, right ankle and foot: Secondary | ICD-10-CM | POA: Diagnosis not present

## 2016-05-26 HISTORY — DX: Other specified postprocedural states: Z98.890

## 2016-05-26 HISTORY — PX: HEEL SPUR RESECTION: SHX6410

## 2016-05-26 HISTORY — PX: ACHILLES TENDON SURGERY: SHX542

## 2016-05-26 HISTORY — DX: Nausea with vomiting, unspecified: R11.2

## 2016-05-26 HISTORY — DX: Bronchitis, not specified as acute or chronic: J40

## 2016-05-26 HISTORY — DX: Strain of right Achilles tendon, initial encounter: S86.011A

## 2016-05-26 LAB — GLUCOSE, CAPILLARY
Glucose-Capillary: 103 mg/dL — ABNORMAL HIGH (ref 65–99)
Glucose-Capillary: 106 mg/dL — ABNORMAL HIGH (ref 65–99)

## 2016-05-26 SURGERY — REPAIR, TENDON, ACHILLES
Anesthesia: General | Site: Leg Lower | Laterality: Right

## 2016-05-26 MED ORDER — FENTANYL CITRATE (PF) 100 MCG/2ML IJ SOLN
INTRAMUSCULAR | Status: DC | PRN
  Administered 2016-05-26: 100 ug via INTRAVENOUS

## 2016-05-26 MED ORDER — LIDOCAINE HCL (CARDIAC) 20 MG/ML IV SOLN
INTRAVENOUS | Status: DC | PRN
Start: 2016-05-26 — End: 2016-05-26
  Administered 2016-05-26: 30 mg via INTRAVENOUS

## 2016-05-26 MED ORDER — CHLORHEXIDINE GLUCONATE 4 % EX LIQD: 60.0000 mL | Freq: Once | CUTANEOUS | Status: DC

## 2016-05-26 MED ORDER — HYDROMORPHONE HCL 1 MG/ML IJ SOLN
INTRAMUSCULAR | Status: AC
  Filled 2016-05-26: qty 1

## 2016-05-26 MED ORDER — MIDAZOLAM HCL 2 MG/2ML IJ SOLN
INTRAMUSCULAR | Status: AC
  Filled 2016-05-26: qty 2

## 2016-05-26 MED ORDER — HYDROMORPHONE HCL 1 MG/ML IJ SOLN
0.2500 mg | INTRAMUSCULAR | Status: DC | PRN
  Administered 2016-05-26 (×4): 0.5 mg via INTRAVENOUS

## 2016-05-26 MED ORDER — PROMETHAZINE HCL 25 MG/ML IJ SOLN
6.2500 mg | INTRAMUSCULAR | Status: DC | PRN
  Administered 2016-05-26: 6.25 mg via INTRAVENOUS

## 2016-05-26 MED ORDER — DEXAMETHASONE SODIUM PHOSPHATE 4 MG/ML IJ SOLN
INTRAMUSCULAR | Status: DC | PRN
Start: 2016-05-26 — End: 2016-05-26
  Administered 2016-05-26: 10 mg via INTRAVENOUS

## 2016-05-26 MED ORDER — EPHEDRINE SULFATE 50 MG/ML IJ SOLN
INTRAMUSCULAR | Status: DC | PRN
  Administered 2016-05-26: 25 mg via INTRAVENOUS

## 2016-05-26 MED ORDER — LACTATED RINGERS IV SOLN
INTRAVENOUS | Status: DC
  Administered 2016-05-26 (×2): via INTRAVENOUS

## 2016-05-26 MED ORDER — PROMETHAZINE HCL 25 MG/ML IJ SOLN
INTRAMUSCULAR | Status: AC
  Filled 2016-05-26: qty 1

## 2016-05-26 MED ORDER — ONDANSETRON HCL 4 MG PO TABS: 4.0000 mg | ORAL_TABLET | Freq: Three times a day (TID) | ORAL | 0 refills | Status: DC | PRN

## 2016-05-26 MED ORDER — LIDOCAINE 2% (20 MG/ML) 5 ML SYRINGE
INTRAMUSCULAR | Status: AC
  Filled 2016-05-26: qty 5

## 2016-05-26 MED ORDER — SCOPOLAMINE 1 MG/3DAYS TD PT72
1.0000 | MEDICATED_PATCH | Freq: Once | TRANSDERMAL | Status: DC | PRN
  Administered 2016-05-26: 1.5 mg via TRANSDERMAL

## 2016-05-26 MED ORDER — SUCCINYLCHOLINE CHLORIDE 200 MG/10ML IV SOSY
PREFILLED_SYRINGE | INTRAVENOUS | Status: AC
  Filled 2016-05-26: qty 10

## 2016-05-26 MED ORDER — FENTANYL CITRATE (PF) 100 MCG/2ML IJ SOLN
INTRAMUSCULAR | Status: AC
  Filled 2016-05-26: qty 2

## 2016-05-26 MED ORDER — MIDAZOLAM HCL 2 MG/2ML IJ SOLN
1.0000 mg | INTRAMUSCULAR | Status: DC | PRN
  Administered 2016-05-26: 2 mg via INTRAVENOUS

## 2016-05-26 MED ORDER — ONDANSETRON HCL 4 MG/2ML IJ SOLN
INTRAMUSCULAR | Status: AC
  Filled 2016-05-26: qty 2

## 2016-05-26 MED ORDER — MEPERIDINE HCL 25 MG/ML IJ SOLN: 6.2500 mg | INTRAMUSCULAR | Status: DC | PRN

## 2016-05-26 MED ORDER — SUCCINYLCHOLINE CHLORIDE 20 MG/ML IJ SOLN
INTRAMUSCULAR | Status: DC | PRN
  Administered 2016-05-26: 50 mg via INTRAVENOUS

## 2016-05-26 MED ORDER — PHENYLEPHRINE HCL 10 MG/ML IJ SOLN
INTRAMUSCULAR | Status: DC | PRN
  Administered 2016-05-26 (×6): 80 ug via INTRAVENOUS

## 2016-05-26 MED ORDER — CEFAZOLIN SODIUM-DEXTROSE 2-4 GM/100ML-% IV SOLN
2.0000 g | INTRAVENOUS | Status: AC
  Administered 2016-05-26: 2 g via INTRAVENOUS

## 2016-05-26 MED ORDER — SCOPOLAMINE 1 MG/3DAYS TD PT72
MEDICATED_PATCH | TRANSDERMAL | Status: AC
  Filled 2016-05-26: qty 1

## 2016-05-26 MED ORDER — FENTANYL CITRATE (PF) 100 MCG/2ML IJ SOLN
50.0000 ug | INTRAMUSCULAR | Status: DC | PRN
  Administered 2016-05-26: 50 ug via INTRAVENOUS

## 2016-05-26 MED ORDER — LACTATED RINGERS IV SOLN: INTRAVENOUS | Status: DC

## 2016-05-26 MED ORDER — SODIUM CHLORIDE 0.9 % IJ SOLN
INTRAMUSCULAR | Status: AC
  Filled 2016-05-26: qty 10

## 2016-05-26 MED ORDER — PHENYLEPHRINE 40 MCG/ML (10ML) SYRINGE FOR IV PUSH (FOR BLOOD PRESSURE SUPPORT)
PREFILLED_SYRINGE | INTRAVENOUS | Status: AC
  Filled 2016-05-26: qty 20

## 2016-05-26 MED ORDER — OXYCODONE-ACETAMINOPHEN 10-325 MG PO TABS: ORAL_TABLET | ORAL | 0 refills | Status: DC

## 2016-05-26 MED ORDER — PROPOFOL 500 MG/50ML IV EMUL
INTRAVENOUS | Status: AC
  Filled 2016-05-26: qty 50

## 2016-05-26 MED ORDER — DEXAMETHASONE SODIUM PHOSPHATE 10 MG/ML IJ SOLN
INTRAMUSCULAR | Status: AC
  Filled 2016-05-26: qty 1

## 2016-05-26 MED ORDER — CEFAZOLIN SODIUM-DEXTROSE 2-4 GM/100ML-% IV SOLN
INTRAVENOUS | Status: AC
  Filled 2016-05-26: qty 100

## 2016-05-26 MED ORDER — HYDROCODONE-ACETAMINOPHEN 7.5-325 MG PO TABS: 1.0000 | ORAL_TABLET | Freq: Once | ORAL | Status: DC | PRN

## 2016-05-26 MED ORDER — BUPIVACAINE-EPINEPHRINE (PF) 0.5% -1:200000 IJ SOLN
INTRAMUSCULAR | Status: DC | PRN
  Administered 2016-05-26: 30 mL via PERINEURAL

## 2016-05-26 MED ORDER — GLYCOPYRROLATE 0.2 MG/ML IJ SOLN
INTRAMUSCULAR | Status: DC | PRN
  Administered 2016-05-26: 0.2 mg via INTRAVENOUS

## 2016-05-26 MED ORDER — MIDAZOLAM HCL 5 MG/5ML IJ SOLN
INTRAMUSCULAR | Status: DC | PRN
  Administered 2016-05-26: 2 mg via INTRAVENOUS

## 2016-05-26 SURGICAL SUPPLY — 72 items
BANDAGE ACE 4X5 VEL STRL LF (GAUZE/BANDAGES/DRESSINGS) ×3 IMPLANT
BANDAGE ACE 6X5 VEL STRL LF (GAUZE/BANDAGES/DRESSINGS) ×3 IMPLANT
BANDAGE ESMARK 6X9 LF (GAUZE/BANDAGES/DRESSINGS) ×2 IMPLANT
BLADE AVERAGE 25X9 (BLADE) ×3 IMPLANT
BLADE SURG 15 STRL LF DISP TIS (BLADE) ×4 IMPLANT
BLADE SURG 15 STRL SS (BLADE) ×2
BNDG COHESIVE 4X5 TAN STRL (GAUZE/BANDAGES/DRESSINGS) ×3 IMPLANT
BNDG ESMARK 6X9 LF (GAUZE/BANDAGES/DRESSINGS) ×3
CANISTER SUCT 1200ML W/VALVE (MISCELLANEOUS) ×3 IMPLANT
COVER BACK TABLE 60X90IN (DRAPES) ×3 IMPLANT
CUFF TOURNIQUET SINGLE 34IN LL (TOURNIQUET CUFF) ×3 IMPLANT
DRAPE EXTREMITY T 121X128X90 (DRAPE) ×3 IMPLANT
DRAPE IMP U-DRAPE 54X76 (DRAPES) ×3 IMPLANT
DRAPE OEC MINIVIEW 54X84 (DRAPES) IMPLANT
DRAPE U-SHAPE 47X51 STRL (DRAPES) ×3 IMPLANT
DRSG PAD ABDOMINAL 8X10 ST (GAUZE/BANDAGES/DRESSINGS) ×3 IMPLANT
DURAPREP 26ML APPLICATOR (WOUND CARE) ×3 IMPLANT
ELECT REM PT RETURN 9FT ADLT (ELECTROSURGICAL) ×3
ELECTRODE REM PT RTRN 9FT ADLT (ELECTROSURGICAL) ×2 IMPLANT
GAUZE SPONGE 4X4 12PLY STRL (GAUZE/BANDAGES/DRESSINGS) ×3 IMPLANT
GAUZE XEROFORM 1X8 LF (GAUZE/BANDAGES/DRESSINGS) ×3 IMPLANT
GLOVE BIOGEL PI IND STRL 7.0 (GLOVE) ×8 IMPLANT
GLOVE BIOGEL PI INDICATOR 7.0 (GLOVE) ×4
GLOVE ECLIPSE 6.5 STRL STRAW (GLOVE) ×3 IMPLANT
GLOVE ECLIPSE 7.0 STRL STRAW (GLOVE) IMPLANT
GLOVE SURG ORTHO 8.0 STRL STRW (GLOVE) ×3 IMPLANT
GOWN STRL REUS W/ TWL LRG LVL3 (GOWN DISPOSABLE) ×2 IMPLANT
GOWN STRL REUS W/ TWL XL LVL3 (GOWN DISPOSABLE) ×4 IMPLANT
GOWN STRL REUS W/TWL LRG LVL3 (GOWN DISPOSABLE) ×1
GOWN STRL REUS W/TWL XL LVL3 (GOWN DISPOSABLE) ×2
IMPL SYS BIOCOMP ACH SPEED (Anchor) ×2 IMPLANT
IMPLANT SYS BIOCOMP ACH SPEED (Anchor) ×3 IMPLANT
NDL SUT 6 .5 CRC .975X.05 MAYO (NEEDLE) ×2 IMPLANT
NEEDLE 1/2 CIR CATGUT .05X1.09 (NEEDLE) IMPLANT
NEEDLE HYPO 22GX1.5 SAFETY (NEEDLE) IMPLANT
NEEDLE MAYO TAPER (NEEDLE) ×1
PACK BASIN DAY SURGERY FS (CUSTOM PROCEDURE TRAY) ×3 IMPLANT
PAD CAST 4YDX4 CTTN HI CHSV (CAST SUPPLIES) ×4 IMPLANT
PADDING CAST ABS 4INX4YD NS (CAST SUPPLIES) ×2
PADDING CAST ABS COTTON 4X4 ST (CAST SUPPLIES) ×4 IMPLANT
PADDING CAST COTTON 4X4 STRL (CAST SUPPLIES) ×2
PADDING CAST COTTON 6X4 STRL (CAST SUPPLIES) IMPLANT
PENCIL BUTTON HOLSTER BLD 10FT (ELECTRODE) ×3 IMPLANT
SLEEVE SCD COMPRESS KNEE MED (MISCELLANEOUS) ×3 IMPLANT
SPLINT FAST PLASTER 5X30 (CAST SUPPLIES) ×20
SPLINT FIBERGLASS 4X30 (CAST SUPPLIES) IMPLANT
SPLINT PLASTER CAST FAST 5X30 (CAST SUPPLIES) ×40 IMPLANT
SPONGE LAP 4X18 X RAY DECT (DISPOSABLE) ×3 IMPLANT
STAPLER VISISTAT 35W (STAPLE) IMPLANT
STOCKINETTE 6  STRL (DRAPES) ×1
STOCKINETTE 6 STRL (DRAPES) ×2 IMPLANT
SUCTION FRAZIER HANDLE 10FR (MISCELLANEOUS)
SUCTION TUBE FRAZIER 10FR DISP (MISCELLANEOUS) IMPLANT
SUT ETHILON 3 0 PS 1 (SUTURE) ×6 IMPLANT
SUT FIBERWIRE #2 38 T-5 BLUE (SUTURE)
SUT VIC AB 0 CT1 27 (SUTURE)
SUT VIC AB 0 CT1 27XBRD ANBCTR (SUTURE) IMPLANT
SUT VIC AB 1 CT1 27 (SUTURE)
SUT VIC AB 1 CT1 27XBRD ANBCTR (SUTURE) IMPLANT
SUT VIC AB 2-0 CT1 (SUTURE) ×3 IMPLANT
SUT VIC AB 2-0 SH 27 (SUTURE)
SUT VIC AB 2-0 SH 27XBRD (SUTURE) IMPLANT
SUT VIC AB 3-0 SH 27 (SUTURE)
SUT VIC AB 3-0 SH 27X BRD (SUTURE) IMPLANT
SUTURE FIBERWR #2 38 T-5 BLUE (SUTURE) IMPLANT
SYR BULB 3OZ (MISCELLANEOUS) ×3 IMPLANT
SYR CONTROL 10ML LL (SYRINGE) IMPLANT
TOWEL OR 17X24 6PK STRL BLUE (TOWEL DISPOSABLE) ×6 IMPLANT
TOWEL OR NON WOVEN STRL DISP B (DISPOSABLE) ×3 IMPLANT
TUBE CONNECTING 20X1/4 (TUBING) ×3 IMPLANT
UNDERPAD 30X30 (UNDERPADS AND DIAPERS) ×3 IMPLANT
YANKAUER SUCT BULB TIP NO VENT (SUCTIONS) ×3 IMPLANT

## 2016-05-26 NOTE — Anesthesia Preprocedure Evaluation (Signed)
Anesthesia Evaluation  Patient identified by MRN, date of birth, ID band Patient awake    Reviewed: Allergy & Precautions, NPO status , Patient's Chart, lab work & pertinent test results, reviewed documented beta blocker date and time   History of Anesthesia Complications (+) PONV and history of anesthetic complications  Airway Mallampati: II       Dental no notable dental hx. (+) Teeth Intact   Pulmonary neg pulmonary ROS,    Pulmonary exam normal breath sounds clear to auscultation       Cardiovascular hypertension, Pt. on medications and Pt. on home beta blockers + CAD  Normal cardiovascular exam+ Valvular Problems/Murmurs MVP  Rhythm:Regular Rate:Normal  mLAD 30-40% cath '07   Neuro/Psych Lumbar radiculopathy  Neuromuscular disease negative psych ROS   GI/Hepatic GERD  Medicated and Controlled,  Endo/Other  diabetes, Well Controlled, Type 2, Oral Hypoglycemic Agents  Renal/GU Renal diseaseHx/o renal calculi  negative genitourinary   Musculoskeletal  (+) Arthritis , Osteoarthritis,  Right achilles tendon rupture   Abdominal (+) + obese,   Peds  Hematology negative hematology ROS (+)   Anesthesia Other Findings   Reproductive/Obstetrics                             Anesthesia Physical Anesthesia Plan  ASA: III  Anesthesia Plan: General and Regional   Post-op Pain Management:  Regional for Post-op pain   Induction: Intravenous  Airway Management Planned: LMA  Additional Equipment:   Intra-op Plan:   Post-operative Plan: Extubation in OR  Informed Consent: I have reviewed the patients History and Physical, chart, labs and discussed the procedure including the risks, benefits and alternatives for the proposed anesthesia with the patient or authorized representative who has indicated his/her understanding and acceptance.     Plan Discussed with: CRNA, Anesthesiologist and  Surgeon  Anesthesia Plan Comments:         Anesthesia Quick Evaluation

## 2016-05-26 NOTE — Anesthesia Postprocedure Evaluation (Signed)
Anesthesia Post Note  Patient: Brenda Flores  Procedure(s) Performed: Procedure(s) (LRB): RIGHT ACHILLES TENDON REPAIR,CALCANEUS SPUR EXCISION (Right) HEEL SPUR EXCISION (Right)  Patient location during evaluation: PACU Anesthesia Type: General Level of consciousness: awake and alert Pain management: pain level controlled Vital Signs Assessment: post-procedure vital signs reviewed and stable Respiratory status: spontaneous breathing, nonlabored ventilation and respiratory function stable Cardiovascular status: blood pressure returned to baseline and stable Postop Assessment: no signs of nausea or vomiting Anesthetic complications: no       Last Vitals:  Vitals:   05/26/16 1245 05/26/16 1307  BP: 122/61 (!) 146/73  Pulse: 70 74  Resp: 12 18  Temp:  37.1 C    Last Pain:  Vitals:   05/26/16 1307  TempSrc:   PainSc: 3                  Welford Christmas A.

## 2016-05-26 NOTE — Discharge Instructions (Signed)
Care After Refer to this sheet in the next few weeks. These discharge instructions provide you with general information on caring for yourself after you leave the hospital. Your caregiver may also give you specific instructions. Your treatment has been planned according to the most current medical practices available, but unavoidable complications sometimes occur. If you have any problems or questions after discharge, please call your caregiver. HOME INSTRUCTIONS You may resume a normal diet and activities as directed. Walk with crutches NON WEIGHT BEARING Do NOT get cast wet.  Do NOT remove dressing until you come back to the doctor Only take over-the-counter or prescription medicines for pain, discomfort, or fever as directed by your caregiver.  Eat a well-balanced diet.  Avoid lifting or driving until you are instructed otherwise.  Make an appointment to see your caregiver for stitches (suture) or staple removal as directed.   SEEK MEDICAL CARE IF: You have swelling of your calf or leg.  You develop shortness of breath or chest pain.  You have redness, swelling, or increasing pain in the wound.  There is pus or any unusual drainage coming from the surgical site.  You notice a bad smell coming from the surgical site or dressing.  The surgical site breaks open after sutures or staples have been removed.  There is persistent bleeding from the suture or staple line.  You are getting worse or are not improving.  You have any other questions or concerns.  SEEK IMMEDIATE MEDICAL CARE IF:  You have a fever.  You develop a rash.  You have difficulty breathing.  You develop any reaction or side effects to medicines given.  Your knee motion is decreasing rather than improving.  MAKE SURE YOU:  Understand these instructions.  Will watch your condition.  Will get help right away if you are not doing well or get worse.   Regional Anesthesia Blocks  1. Numbness or the inability to move the  "blocked" extremity may last from 3-48 hours after placement. The length of time depends on the medication injected and your individual response to the medication. If the numbness is not going away after 48 hours, call your surgeon.  2. The extremity that is blocked will need to be protected until the numbness is gone and the  Strength has returned. Because you cannot feel it, you will need to take extra care to avoid injury. Because it may be weak, you may have difficulty moving it or using it. You may not know what position it is in without looking at it while the block is in effect.  3. For blocks in the legs and feet, returning to weight bearing and walking needs to be done carefully. You will need to wait until the numbness is entirely gone and the strength has returned. You should be able to move your leg and foot normally before you try and bear weight or walk. You will need someone to be with you when you first try to ensure you do not fall and possibly risk injury.  4. Bruising and tenderness at the needle site are common side effects and will resolve in a few days.  5. Persistent numbness or new problems with movement should be communicated to the surgeon or the [facility] [phone] [place] [phone]).  Post Anesthesia Home Care Instructions  Activity: Get plenty of rest for the remainder of the day. A responsible adult should stay with you for 24 hours following the procedure.  For  the next 24 hours, DO NOT: -Drive a car -Paediatric nurse -Drink alcoholic beverages -Take any medication unless instructed by your physician -Make any legal decisions or sign important papers.  Meals: Start with liquid foods such as gelatin or soup. Progress to regular foods as tolerated. Avoid greasy, spicy, heavy foods. If nausea and/or vomiting occur, drink only clear liquids until the nausea and/or vomiting subsides. Call your physician if vomiting  continues.  Special Instructions/Symptoms: Your throat may feel dry or sore from the anesthesia or the breathing tube placed in your throat during surgery. If this causes discomfort, gargle with warm salt water. The discomfort should disappear within 24 hours.  If you had a scopolamine patch placed behind your ear for the management of post- operative nausea and/or vomiting:  1. The medication in the patch is effective for 72 hours, after which it should be removed.  Wrap patch in a tissue and discard in the trash. Wash hands thoroughly with soap and water. 2. You may remove the patch earlier than 72 hours if you experience unpleasant side effects which may include dry mouth, dizziness or visual disturbances. 3. Avoid touching the patch. Wash your hands with soap and water after contact with the patch.

## 2016-05-26 NOTE — Transfer of Care (Signed)
Immediate Anesthesia Transfer of Care Note  Patient: Brenda Flores  Procedure(s) Performed: Procedure(s): RIGHT ACHILLES TENDON REPAIR,CALCANEUS SPUR EXCISION (Right) HEEL SPUR EXCISION (Right)  Patient Location: PACU  Anesthesia Type:GA combined with regional for post-op pain  Level of Consciousness: sedated  Airway & Oxygen Therapy: Patient Spontanous Breathing and Patient connected to face mask oxygen  Post-op Assessment: Report given to RN and Post -op Vital signs reviewed and stable  Post vital signs: Reviewed and stable  Last Vitals:  Vitals:   05/26/16 1144 05/26/16 1145  BP:  132/86  Pulse: 71 71  Resp:  17  Temp:      Last Pain:  Vitals:   05/26/16 0806  TempSrc: Oral  PainSc: 8          Complications: No apparent anesthesia complications

## 2016-05-26 NOTE — Anesthesia Procedure Notes (Signed)
Procedure Name: Intubation Date/Time: 05/26/2016 10:28 AM Performed by: Marrianne Mood Pre-anesthesia Checklist: Patient identified, Emergency Drugs available, Suction available, Patient being monitored and Timeout performed Patient Re-evaluated:Patient Re-evaluated prior to inductionOxygen Delivery Method: Circle system utilized Preoxygenation: Pre-oxygenation with 100% oxygen Intubation Type: IV induction Ventilation: Mask ventilation without difficulty Laryngoscope Size: Miller and 3 Grade View: Grade III Tube type: Oral Tube size: 7.0 mm Number of attempts: 1 Airway Equipment and Method: Stylet and Oral airway Placement Confirmation: ETT inserted through vocal cords under direct vision,  positive ETCO2 and breath sounds checked- equal and bilateral Secured at: 21 cm Tube secured with: Tape Dental Injury: Teeth and Oropharynx as per pre-operative assessment

## 2016-05-26 NOTE — Anesthesia Procedure Notes (Addendum)
Anesthesia Regional Block: Popliteal block   Pre-Anesthetic Checklist: ,, timeout performed, Correct Patient, Correct Site, Correct Laterality, Correct Procedure, Correct Position, site marked, Risks and benefits discussed,  Surgical consent,  Pre-op evaluation,  At surgeon's request and post-op pain management  Laterality: Right  Prep: chloraprep       Needles:  Injection technique: Single-shot  Needle Type: Echogenic Stimulator Needle     Needle Length: 9cm  Needle Gauge: 21   Needle insertion depth: 5 cm   Additional Needles:   Procedures: ultrasound guided,,,,,,,,  Narrative:  Start time: 05/26/2016 9:02 AM End time: 05/26/2016 9:08 AM Injection made incrementally with aspirations every 5 mL.  Performed by: Personally  Anesthesiologist: Josephine Igo  Additional Notes: Timeout performed. Patient sedated. Relevant anatomy ID'd using [place]. Incremental 2-64ml injection with frequent aspiration. Patient tolerated procedure well.

## 2016-05-26 NOTE — Interval H&P Note (Signed)
History and Physical Interval Note:  05/26/2016 7:45 AM  Brenda Flores  has presented today for surgery, with the diagnosis of RIGHT ANKLE SPONTANEOUS RUPTURE OF FLEXOR TENDON LOWER LEG  The various methods of treatment have been discussed with the patient and family. After consideration of risks, benefits and other options for treatment, the patient has consented to  Procedure(s): RIGHT ACHILLES TENDON REPAIR,CALCANEUS EXCISION (Right) as a surgical intervention .  The patient's history has been reviewed, patient examined, no change in status, stable for surgery.  I have reviewed the patient's chart and labs.  Questions were answered to the patient's satisfaction.     Ninetta Lights

## 2016-05-26 NOTE — Progress Notes (Signed)
Assisted Dr. Foster with right, ultrasound guided, popliteal block. Side rails up, monitors on throughout procedure. See vital signs in flow sheet. Tolerated Procedure well. 

## 2016-05-27 NOTE — Op Note (Signed)
NAME:  Brenda Flores, Brenda Flores NO.:  [id number]  MEDICAL RECORD NO.:  [id number]  LOCATION:                                 FACILITY:  PHYSICIAN:  Ninetta Lights, M.D. DATE OF BIRTH:  31-Jan-1946  DATE OF PROCEDURE:  05/26/2016 DATE OF DISCHARGE:                              OPERATIVE REPORT   PREOPERATIVE DIAGNOSES: 1. 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 2. Marked degeneration and partial tearing of Achilles tendon     attachment.  POSTOPERATIVE DIAGNOSES: 1. 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 2. Marked degeneration and partial tearing of Achilles tendon     attachment.  PROCEDURES: 1. Right heel exploration. 2. Detachment, debridement, and then primary repair of Achilles tendon     back to os calcis with an Arthrex speed bridge technique. 3. Debridement of Achilles __________ attachment spur and Haglund's     deformity from os calcis.  SURGEON:  Ninetta Lights, M.D.  ASSISTANT:  Elmyra Ricks, PA, present throughout the entire case and necessary for timely completion of procedure.  ANESTHESIA:  General.  BLOOD LOSS:  Minimal.  SPECIMENS:  None.  CULTURES:  None.  COMPLICATIONS:  None.  DRESSINGS:  Soft compressive short leg splint.  TOURNIQUET TIME:  45 minutes.  DESCRIPTION OF PROCEDURE:  The patient was brought to operating room, and after adequate anesthesia had been obtained, tourniquet applied, turned her to prone position with appropriate padding and support. Exsanguinated with elevation of E[place].  Tourniquet inflated to 350 mmHg.  Longitudinal incision in posterior aspect of right heel just lateral.  Skin and subcutaneous tissue divided.  Marked significant __________ attachment spur.  Distal Achilles was taken down, debrided to healthy tissue, retracted proximally.  The __________ attachment spur, Haglund deformity were all contoured and removed with the  oscillating saw and osteotome confirming good debridement with fluoroscopy.  I then placed 4 drill holes in the os calcis, 2 proximal and 2 distal.  The speed bridge technique was then used putting in the 2 proximal anchors, weaving them through the tendon, crossing them over, and then anchoring them distally with the other 2 SwiveLock anchors.  The side sutures from the proximal anchors were used to sew the tendon down there as well. Nice firm attachment.  I could get it plantigrade with the knee flexed. Wound irrigated.  Closed with Vicryl and nylon.  Sterile compressive dressing applied.  Short-leg splint applied.  Tourniquet deflated and removed.  Returned to a supine position.  Anesthesia reversed.  Brought to the recovery room.  Tolerated the surgery well.  No complications.     Ninetta Lights, M.D.   ______________________________ Ninetta Lights, M.D.    DFM/MEDQ  D:  05/26/2016  T:  05/26/2016  Job:  [id number]

## 2016-05-30 ENCOUNTER — Encounter (HOSPITAL_BASED_OUTPATIENT_CLINIC_OR_DEPARTMENT_OTHER): Payer: Self-pay | Admitting: Orthopedic Surgery

## 2016-06-03 DIAGNOSIS — M25774 Osteophyte, right foot: Secondary | ICD-10-CM | POA: Diagnosis not present

## 2016-06-10 ENCOUNTER — Other Ambulatory Visit: Payer: Self-pay | Admitting: Primary Care

## 2016-06-10 DIAGNOSIS — I10 Essential (primary) hypertension: Secondary | ICD-10-CM

## 2016-06-10 DIAGNOSIS — M25774 Osteophyte, right foot: Secondary | ICD-10-CM | POA: Diagnosis not present

## 2016-06-16 ENCOUNTER — Other Ambulatory Visit: Payer: Medicare Other

## 2016-06-21 ENCOUNTER — Other Ambulatory Visit (INDEPENDENT_AMBULATORY_CARE_PROVIDER_SITE_OTHER): Payer: Medicare Other

## 2016-06-21 DIAGNOSIS — I10 Essential (primary) hypertension: Secondary | ICD-10-CM

## 2016-06-21 LAB — BASIC METABOLIC PANEL
BUN: 18 mg/dL (ref 6–23)
CALCIUM: 9.5 mg/dL (ref 8.4–10.5)
CO2: 33 mEq/L — ABNORMAL HIGH (ref 19–32)
Chloride: 105 mEq/L (ref 96–112)
Creatinine, Ser: 0.84 mg/dL (ref 0.40–1.20)
GFR: 86.34 mL/min (ref >60.00)
Glucose, Bld: 97 mg/dL (ref 70–99)
Potassium: 4 mEq/L (ref 3.5–5.1)
SODIUM: 143 meq/L (ref 135–145)

## 2016-06-29 ENCOUNTER — Ambulatory Visit (INDEPENDENT_AMBULATORY_CARE_PROVIDER_SITE_OTHER): Payer: Medicare Other | Admitting: Primary Care

## 2016-06-29 ENCOUNTER — Encounter (INDEPENDENT_AMBULATORY_CARE_PROVIDER_SITE_OTHER): Payer: Self-pay

## 2016-06-29 VITALS — BP 148/96 | HR 72 | Temp 98.4°F | Ht 65.0 in | Wt 196.8 lb

## 2016-06-29 DIAGNOSIS — I2510 Atherosclerotic heart disease of native coronary artery without angina pectoris: Secondary | ICD-10-CM | POA: Diagnosis not present

## 2016-06-29 DIAGNOSIS — R110 Nausea: Secondary | ICD-10-CM

## 2016-06-29 DIAGNOSIS — I10 Essential (primary) hypertension: Secondary | ICD-10-CM

## 2016-06-29 DIAGNOSIS — E119 Type 2 diabetes mellitus without complications: Secondary | ICD-10-CM | POA: Diagnosis not present

## 2016-06-29 MED ORDER — PROMETHAZINE HCL 25 MG PO TABS: ORAL_TABLET | ORAL | 0 refills | Status: DC

## 2016-06-29 MED ORDER — AMLODIPINE BESYLATE 10 MG PO TABS: 10.0000 mg | ORAL_TABLET | Freq: Every day | ORAL | 3 refills | Status: DC

## 2016-06-29 MED ORDER — SITAGLIPTIN PHOSPHATE 100 MG PO TABS: 100.0000 mg | ORAL_TABLET | Freq: Every day | ORAL | 3 refills | Status: DC

## 2016-06-29 NOTE — Progress Notes (Signed)
Pre visit review using our clinic review tool, if applicable. No additional management support is needed unless otherwise documented below in the visit note. 

## 2016-06-29 NOTE — Patient Instructions (Signed)
I sent refills of your medications to the pharmacy.  Shortness of Breath/Wheezing/Cough: Use the albuterol inhaler. Inhale 2 puffs into the lungs every 6 to 8 hours as needed for wheezing and/or shortness of breath.   Please notify me if you require use of this inhaler for more than 1 week.   Cough/ Chest Congestion: Try taking Mucinex DM. This will help loosen up the mucous in your chest. Ensure you take this medication with a full glass of water.  Fevers/Chills/Body Aches: Ibuprofen 400 mg three times daily as needed. Do not take any extra tylenol as your pain medication has tylenol in it.  Please notify me if you develop persistent fevers of 101, start coughing up green mucous, notice increased fatigue or weakness, or feel worse after 1 week of onset of symptoms.   Increase consumption of water intake and rest.  It was a pleasure to see you today!

## 2016-06-29 NOTE — Progress Notes (Signed)
Subjective:    Patient ID: Brenda Flores, female    DOB: October 04, 1945, 71 y.o.   MRN: [id number]  HPI  Brenda Flores is a 71 year old female with history of CHF, HTN (managed on ACE), CAD who presents today with a chief complaint of cough. She also reports wheezing. Her symptoms began yesterday. Her cough is non productive. She's been taken anything OTC. She denies fevers, sore throat, nasal congestion, sinus pressure, lower extremity edema, chest pain.   Review of Systems  Constitutional: Negative for chills, fatigue and fever.  HENT: Negative for congestion, ear pain, sinus pressure and sore throat.   Respiratory: Positive for cough and wheezing. Negative for shortness of breath.        Past Medical History:  Diagnosis Date  . Achilles rupture, right   . Bronchitis   . CAD (coronary artery disease) Dr Burt Knack   non-obs CAD by cath in 7/07 (mLAD 30-40%, EF 65%)  . DDD (degenerative disc disease), lumbar   . DJD (degenerative joint disease)    KNEES  . GERD (gastroesophageal reflux disease)   . History of mitral valve prolapse    PER PT REPORTED MVP  PER DR HARSHAW FROM CARDIAC CATH IN 1987 (APPROX)  . 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 Hyperlipidemia   . Hypertension   . Left nephrolithiasis    NON-OBSTRUCTIVE  . Liver, polycystic   . Lumbar radiculopathy   . Osteoporosis   . PONV (postoperative nausea and vomiting)   . Right ureteral stone   . Type 2 diabetes mellitus ([place])      Social History   Social History  . Marital status: Widowed    Spouse name: N/A  . Number of children: N/A  . Years of education: N/A   Occupational History  . Not on file.   Social History Main Topics  . Smoking status: Never Smoker  . Smokeless tobacco: Never Used  . Alcohol use No  . Drug use: No  . Sexual activity: Not on file   Other Topics Concern  . Not on file   Social History Narrative  . No narrative on  file    Past Surgical History:  Procedure Laterality Date  . ACHILLES TENDON SURGERY Right 05/26/2016   Procedure: RIGHT ACHILLES TENDON REPAIR,CALCANEUS SPUR EXCISION;  Surgeon: Ninetta Lights, MD;  Location: [facility];  Service: Orthopedics;  Laterality: Right;  . CARDIAC CATHETERIZATION  10-17-2005   DR COOPER   NON-OBSTRUCTIVE CAD/   mLAD 30-40%/   NORMAL LVF/  EF 65%  . CARDIOVASCULAR STRESS TEST  04-12-2011  DR Carleene Overlie TAYLOR   LOW RISK STUDY/  NO ISCHEMIA/  EF 68%  . CATARACT EXTRACTION W/ INTRAOCULAR LENS IMPLANT Right 2003  . COLONOSCOPY W/ POLYPECTOMY  03-17-2011  . CYSTOSCOPY WITH URETEROSCOPY Right 06/14/2013   Procedure: RIGHT  URETEROSCOPY,ureteral and urethral dilitation;  Surgeon: Claybon Jabs, MD;  Location[facility];  Service: Urology;  Laterality: Right;  . HEEL SPUR RESECTION Right 05/26/2016   Procedure: HEEL SPUR EXCISION;  Surgeon: Ninetta Lights, MD;  Location: [facility];  Service: Orthopedics;  Laterality: Right;  . HEEL SPUR SURGERY Left 2003  . TOTAL ABDOMINAL HYSTERECTOMY W/ BILATERAL SALPINGOOPHORECTOMY  1978  . TRANSTHORACIC ECHOCARDIOGRAM  04-07-2011   MILD LVH/  MILD FOCAL BASAL SEPTAL HYPERTROPHY/  EF  55-60% /  GRADE I DIASTOLIC DYSFUNCTION  Family History  Problem Relation Age of Onset  . Cancer Mother     breast  . Cancer Father     colon  . Cancer Brother     ?lung    Allergies  Allergen Reactions  . Darvocet [Propoxyphene N-Acetaminophen] Nausea And Vomiting    Current Outpatient Prescriptions on File Prior to Visit  Medication Sig Dispense Refill  . albuterol (PROVENTIL HFA;VENTOLIN HFA) 108 (90 BASE) MCG/ACT inhaler Inhale 1-2 puffs into the lungs every 6 (six) hours as needed for wheezing or shortness of breath. 1 Inhaler 0  . carvedilol (COREG) 6.25 MG tablet Take 1 tablet (6.25 mg total) by mouth 2 (two) times daily with a meal. 60 tablet 3  . simvastatin (ZOCOR) 20 MG tablet Take 1  tablet (20 mg total) by mouth daily. 90 tablet 3  . lisinopril (PRINIVIL,ZESTRIL) 5 MG tablet Take 1 tablet (5 mg total) by mouth daily. For kidney protection. (Patient not taking: Reported on 06/29/2016) 90 tablet 3  . oxyCODONE-acetaminophen (PERCOCET) 10-325 MG tablet Take 1-2 tabs po q4-6 hours prn pain (Patient not taking: Reported on 06/29/2016) 60 tablet 0   No current facility-administered medications on file prior to visit.     BP (!) 148/96   Pulse 72   Temp 98.4 F (36.9 C) (Oral)   Ht 5\' 5"  (1.651 m)   Wt 196 lb 12.8 oz (89.3 kg)   SpO2 97%   BMI 32.75 kg/m    Objective:   Physical Exam  Constitutional: She appears well-nourished.  HENT:  Right Ear: Tympanic membrane and ear canal normal.  Left Ear: Tympanic membrane and ear canal normal.  Nose: Right sinus exhibits no maxillary sinus tenderness and no frontal sinus tenderness. Left sinus exhibits no maxillary sinus tenderness and no frontal sinus tenderness.  Mouth/Throat: Oropharynx is clear and moist.  Eyes: Conjunctivae are normal.  Neck: Neck supple.  Cardiovascular: Normal rate and regular rhythm.   Pulmonary/Chest: Effort normal and breath sounds normal. She has no wheezes. She has no rales.  Mild wheezing to left upper lobe  Lymphadenopathy:    She has no cervical adenopathy.  Skin: Skin is warm and dry.          Assessment & Plan:  URI vs Seasonal Allergies:  Cough, wheezing x 24 hours. Exam today with mild wheezing as noted above, but does not appear acutely ill and is in no distress. Xray from 04/2016 reviewed, borderline cardiomegaly. This doesn't seem to be a CHF exacerbation. No bacterial process identified. Will have her use albuterol PRN. Discussed symptoms may progress if URI. Return precautions provided.  Sheral Flow, NP

## 2016-07-12 DIAGNOSIS — M25774 Osteophyte, right foot: Secondary | ICD-10-CM | POA: Diagnosis not present

## 2016-07-14 DIAGNOSIS — M216X1 Other acquired deformities of right foot: Secondary | ICD-10-CM | POA: Diagnosis not present

## 2016-07-14 DIAGNOSIS — S86001D Unspecified injury of right Achilles tendon, subsequent encounter: Secondary | ICD-10-CM | POA: Diagnosis not present

## 2016-07-14 DIAGNOSIS — R531 Weakness: Secondary | ICD-10-CM | POA: Diagnosis not present

## 2016-07-14 DIAGNOSIS — R262 Difficulty in walking, not elsewhere classified: Secondary | ICD-10-CM | POA: Diagnosis not present

## 2016-07-19 DIAGNOSIS — M216X1 Other acquired deformities of right foot: Secondary | ICD-10-CM | POA: Diagnosis not present

## 2016-07-19 DIAGNOSIS — R262 Difficulty in walking, not elsewhere classified: Secondary | ICD-10-CM | POA: Diagnosis not present

## 2016-07-19 DIAGNOSIS — R531 Weakness: Secondary | ICD-10-CM | POA: Diagnosis not present

## 2016-07-19 DIAGNOSIS — S86001D Unspecified injury of right Achilles tendon, subsequent encounter: Secondary | ICD-10-CM | POA: Diagnosis not present

## 2016-07-21 DIAGNOSIS — S86001D Unspecified injury of right Achilles tendon, subsequent encounter: Secondary | ICD-10-CM | POA: Diagnosis not present

## 2016-07-21 DIAGNOSIS — M216X1 Other acquired deformities of right foot: Secondary | ICD-10-CM | POA: Diagnosis not present

## 2016-07-21 DIAGNOSIS — R531 Weakness: Secondary | ICD-10-CM | POA: Diagnosis not present

## 2016-07-21 DIAGNOSIS — R262 Difficulty in walking, not elsewhere classified: Secondary | ICD-10-CM | POA: Diagnosis not present

## 2016-07-26 DIAGNOSIS — S86001D Unspecified injury of right Achilles tendon, subsequent encounter: Secondary | ICD-10-CM | POA: Diagnosis not present

## 2016-07-26 DIAGNOSIS — M216X1 Other acquired deformities of right foot: Secondary | ICD-10-CM | POA: Diagnosis not present

## 2016-07-26 DIAGNOSIS — R262 Difficulty in walking, not elsewhere classified: Secondary | ICD-10-CM | POA: Diagnosis not present

## 2016-07-26 DIAGNOSIS — R531 Weakness: Secondary | ICD-10-CM | POA: Diagnosis not present

## 2016-07-28 DIAGNOSIS — R531 Weakness: Secondary | ICD-10-CM | POA: Diagnosis not present

## 2016-07-28 DIAGNOSIS — M216X1 Other acquired deformities of right foot: Secondary | ICD-10-CM | POA: Diagnosis not present

## 2016-07-28 DIAGNOSIS — S86001D Unspecified injury of right Achilles tendon, subsequent encounter: Secondary | ICD-10-CM | POA: Diagnosis not present

## 2016-07-28 DIAGNOSIS — R262 Difficulty in walking, not elsewhere classified: Secondary | ICD-10-CM | POA: Diagnosis not present

## 2016-08-09 DIAGNOSIS — S86001D Unspecified injury of right Achilles tendon, subsequent encounter: Secondary | ICD-10-CM | POA: Diagnosis not present

## 2016-08-09 DIAGNOSIS — R531 Weakness: Secondary | ICD-10-CM | POA: Diagnosis not present

## 2016-08-09 DIAGNOSIS — R262 Difficulty in walking, not elsewhere classified: Secondary | ICD-10-CM | POA: Diagnosis not present

## 2016-08-09 DIAGNOSIS — M216X1 Other acquired deformities of right foot: Secondary | ICD-10-CM | POA: Diagnosis not present

## 2016-08-11 DIAGNOSIS — R531 Weakness: Secondary | ICD-10-CM | POA: Diagnosis not present

## 2016-08-11 DIAGNOSIS — M216X1 Other acquired deformities of right foot: Secondary | ICD-10-CM | POA: Diagnosis not present

## 2016-08-11 DIAGNOSIS — R262 Difficulty in walking, not elsewhere classified: Secondary | ICD-10-CM | POA: Diagnosis not present

## 2016-08-11 DIAGNOSIS — S86011D Strain of right Achilles tendon, subsequent encounter: Secondary | ICD-10-CM | POA: Diagnosis not present

## 2016-08-18 DIAGNOSIS — R262 Difficulty in walking, not elsewhere classified: Secondary | ICD-10-CM | POA: Diagnosis not present

## 2016-08-18 DIAGNOSIS — M216X1 Other acquired deformities of right foot: Secondary | ICD-10-CM | POA: Diagnosis not present

## 2016-08-18 DIAGNOSIS — R531 Weakness: Secondary | ICD-10-CM | POA: Diagnosis not present

## 2016-08-18 DIAGNOSIS — S86001D Unspecified injury of right Achilles tendon, subsequent encounter: Secondary | ICD-10-CM | POA: Diagnosis not present

## 2016-08-23 DIAGNOSIS — M216X1 Other acquired deformities of right foot: Secondary | ICD-10-CM | POA: Diagnosis not present

## 2016-08-29 DIAGNOSIS — M216X1 Other acquired deformities of right foot: Secondary | ICD-10-CM | POA: Diagnosis not present

## 2016-08-29 DIAGNOSIS — R262 Difficulty in walking, not elsewhere classified: Secondary | ICD-10-CM | POA: Diagnosis not present

## 2016-08-29 DIAGNOSIS — R531 Weakness: Secondary | ICD-10-CM | POA: Diagnosis not present

## 2016-08-29 DIAGNOSIS — S86001D Unspecified injury of right Achilles tendon, subsequent encounter: Secondary | ICD-10-CM | POA: Diagnosis not present

## 2016-09-09 DIAGNOSIS — M216X1 Other acquired deformities of right foot: Secondary | ICD-10-CM | POA: Diagnosis not present

## 2016-09-23 ENCOUNTER — Emergency Department (HOSPITAL_COMMUNITY): Payer: Medicare Other

## 2016-09-23 ENCOUNTER — Encounter (HOSPITAL_COMMUNITY): Payer: Self-pay | Admitting: *Deleted

## 2016-09-23 ENCOUNTER — Emergency Department (HOSPITAL_COMMUNITY)
Admission: EM | Admit: 2016-09-23 | Discharge: 2016-09-23 | Disposition: A | Discharge status: Home / Self Care | Payer: Medicare Other | Attending: Emergency Medicine | Admitting: Emergency Medicine

## 2016-09-23 ENCOUNTER — Emergency Department (HOSPITAL_BASED_OUTPATIENT_CLINIC_OR_DEPARTMENT_OTHER)
Admit: 2016-09-23 | Discharge: 2016-09-23 | Disposition: A | Discharge status: Home / Self Care | Payer: Medicare Other | Attending: Emergency Medicine | Admitting: Emergency Medicine

## 2016-09-23 DIAGNOSIS — R0789 Other chest pain: Secondary | ICD-10-CM | POA: Diagnosis not present

## 2016-09-23 DIAGNOSIS — I5030 Unspecified diastolic (congestive) heart failure: Secondary | ICD-10-CM | POA: Diagnosis not present

## 2016-09-23 DIAGNOSIS — I110 Hypertensive heart disease with heart failure: Secondary | ICD-10-CM | POA: Insufficient documentation

## 2016-09-23 DIAGNOSIS — M216X1 Other acquired deformities of right foot: Secondary | ICD-10-CM | POA: Diagnosis not present

## 2016-09-23 DIAGNOSIS — I2510 Atherosclerotic heart disease of native coronary artery without angina pectoris: Secondary | ICD-10-CM | POA: Diagnosis not present

## 2016-09-23 DIAGNOSIS — R079 Chest pain, unspecified: Secondary | ICD-10-CM | POA: Diagnosis not present

## 2016-09-23 DIAGNOSIS — Z79899 Other long term (current) drug therapy: Secondary | ICD-10-CM | POA: Diagnosis not present

## 2016-09-23 DIAGNOSIS — E119 Type 2 diabetes mellitus without complications: Secondary | ICD-10-CM | POA: Diagnosis not present

## 2016-09-23 DIAGNOSIS — M79661 Pain in right lower leg: Secondary | ICD-10-CM | POA: Insufficient documentation

## 2016-09-23 DIAGNOSIS — M79609 Pain in unspecified limb: Secondary | ICD-10-CM

## 2016-09-23 DIAGNOSIS — Z7984 Long term (current) use of oral hypoglycemic drugs: Secondary | ICD-10-CM | POA: Diagnosis not present

## 2016-09-23 DIAGNOSIS — R2241 Localized swelling, mass and lump, right lower limb: Secondary | ICD-10-CM | POA: Diagnosis present

## 2016-09-23 LAB — BASIC METABOLIC PANEL
ANION GAP: 7 (ref 5–15)
BUN: 12 mg/dL (ref 6–20)
CALCIUM: 9 mg/dL (ref 8.9–10.3)
CHLORIDE: 105 mmol/L (ref 101–111)
CO2: 27 mmol/L (ref 22–32)
Creatinine, Ser: 0.77 mg/dL (ref 0.44–1.00)
GFR calc non Af Amer: >60 mL/min (ref >60)
GLUCOSE: 91 mg/dL (ref 65–99)
Potassium: 3.7 mmol/L (ref 3.5–5.1)
Sodium: 139 mmol/L (ref 135–145)

## 2016-09-23 LAB — CBC
HCT: 39.9 % (ref 36.0–46.0)
HEMOGLOBIN: 13 g/dL (ref 12.0–15.0)
MCH: 28.9 pg (ref 26.0–34.0)
MCHC: 32.6 g/dL (ref 30.0–36.0)
MCV: 88.7 fL (ref 78.0–100.0)
Platelets: 214 10*3/uL (ref 150–400)
RBC: 4.5 MIL/uL (ref 3.87–5.11)
RDW: 14.6 % (ref 11.5–15.5)
WBC: 5.9 10*3/uL (ref 4.0–10.5)

## 2016-09-23 LAB — I-STAT TROPONIN, ED: Troponin i, poc: 0 ng/mL (ref 0.00–0.08)

## 2016-09-23 MED ORDER — IOPAMIDOL (ISOVUE-370) INJECTION 76%
INTRAVENOUS | Status: AC
  Administered 2016-09-23: 100 mL
  Filled 2016-09-23: qty 100

## 2016-09-23 MED ORDER — ACETAMINOPHEN 325 MG PO TABS
650.0000 mg | ORAL_TABLET | Freq: Once | ORAL | Status: AC
  Administered 2016-09-23: 650 mg via ORAL
  Filled 2016-09-23: qty 2

## 2016-09-23 NOTE — ED Provider Notes (Signed)
[facility] DEPT Provider Note   CSN: [id number] Arrival date & time: 09/23/16  1043     History   Chief Complaint Chief Complaint  Patient presents with  . Leg Swelling  . Chest Pain    HPI Brenda Flores is a 71 y.o. female.  HPI Patient with recent right heel spur surgery and immobilization presents with right lower extremity swelling and pain that started 2 days ago. She also developed central chest pain associated with cough. Describes the pain as tightness. This is not worse with deep inspiration. Mother with history of DVTs. No fever or chills. Past Medical History:  Diagnosis Date  . Achilles rupture, right   . Bronchitis   . CAD (coronary artery disease) Dr Burt Knack   non-obs CAD by cath in 7/07 (mLAD 30-40%, EF 65%)  . DDD (degenerative disc disease), lumbar   . DJD (degenerative joint disease)    KNEES  . GERD (gastroesophageal reflux disease)   . History of mitral valve prolapse    PER PT REPORTED MVP  PER DR HARSHAW FROM CARDIAC CATH IN 1987 (APPROX)  . 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 Hyperlipidemia   . Hypertension   . Left nephrolithiasis    NON-OBSTRUCTIVE  . Liver, polycystic   . Lumbar radiculopathy   . Osteoporosis   . PONV (postoperative nausea and vomiting)   . Right ureteral stone   . Type 2 diabetes mellitus [facility])     Patient Active Problem List   Diagnosis Date Noted  . Diabetes type 2, controlled ([place])   . HLD (hyperlipidemia)   . History of mitral valve prolapse 04/05/2012  . Liver cyst   . Diastolic heart failure, NYHA class 1 ([place]) 04/08/2011  . Lumbar radiculopathy 04/07/2011  . CAD in native artery 04/07/2011  . Lumbar disc disease 04/07/2011  . Osteoarthritis of left knee 04/07/2011  . Hypertension 04/06/2011    Past Surgical History:  Procedure Laterality Date  . ACHILLES TENDON SURGERY Right 05/26/2016   Procedure: RIGHT ACHILLES TENDON REPAIR,CALCANEUS SPUR  EXCISION;  Surgeon: Ninetta Lights, MD;  Location: [facility];  Service: Orthopedics;  Laterality: Right;  . CARDIAC CATHETERIZATION  10-17-2005   DR COOPER   NON-OBSTRUCTIVE CAD/   mLAD 30-40%/   NORMAL LVF/  EF 65%  . CARDIOVASCULAR STRESS TEST  04-12-2011  DR Carleene Overlie TAYLOR   LOW RISK STUDY/  NO ISCHEMIA/  EF 68%  . CATARACT EXTRACTION W/ INTRAOCULAR LENS IMPLANT Right 2003  . COLONOSCOPY W/ POLYPECTOMY  03-17-2011  . CYSTOSCOPY WITH URETEROSCOPY Right 06/14/2013   Procedure: RIGHT  URETEROSCOPY,ureteral and urethral dilitation;  Surgeon: Claybon Jabs, MD;  Location: [facility];  Service: Urology;  Laterality: Right;  . HEEL SPUR RESECTION Right 05/26/2016   Procedure: HEEL SPUR EXCISION;  Surgeon: Ninetta Lights, MD;  Location: [facility];  Service: Orthopedics;  Laterality: Right;  . HEEL SPUR SURGERY Left 2003  . TOTAL ABDOMINAL HYSTERECTOMY W/ BILATERAL SALPINGOOPHORECTOMY  1978  . TRANSTHORACIC ECHOCARDIOGRAM  04-07-2011   MILD LVH/  MILD FOCAL BASAL SEPTAL HYPERTROPHY/  EF  55-60% /  GRADE I DIASTOLIC DYSFUNCTION    OB History    No data available       Home Medications    Prior to Admission medications   Medication Sig Start Date End Date Taking? Authorizing Provider  amLODipine (NORVASC) 10 MG tablet Take 1 tablet (10 mg  total) by mouth at bedtime. 06/29/16  Yes Pleas Koch, NP  carvedilol (COREG) 6.25 MG tablet Take 1 tablet (6.25 mg total) by mouth 2 (two) times daily with a meal. 05/24/16  Yes Pleas Koch, NP  sitaGLIPtin (JANUVIA) 100 MG tablet Take 1 tablet (100 mg total) by mouth at bedtime. 06/29/16  Yes Pleas Koch, NP  traMADol (ULTRAM) 50 MG tablet Take 50 mg by mouth every 8 (eight) hours as needed for pain. 09/20/16  Yes [provider]  albuterol (PROVENTIL HFA;VENTOLIN HFA) 108 (90 BASE) MCG/ACT inhaler Inhale 1-2 puffs into the lungs every 6 (six) hours as needed for wheezing or shortness of  breath. 10/16/13   Rancour, Annie Main, MD  lisinopril (PRINIVIL,ZESTRIL) 5 MG tablet Take 1 tablet (5 mg total) by mouth daily. For kidney protection. Patient not taking: Reported on 06/29/2016 05/17/16   Pleas Koch, NP  oxyCODONE-acetaminophen [facility]) 10-325 MG tablet Take 1-2 tabs po q4-6 hours prn pain Patient not taking: Reported on 06/29/2016 05/26/16   Aundra Dubin, PA-C  promethazine (PHENERGAN) 25 MG tablet Take 1 Tablet By Mouth Every 4 to 6 Hours As Needed 06/29/16   Pleas Koch, NP  simvastatin (ZOCOR) 20 MG tablet Take 1 tablet (20 mg total) by mouth daily. Patient not taking: Reported on 09/23/2016 05/17/16   Pleas Koch, NP    Family History Family History  Problem Relation Age of Onset  . Cancer Mother        breast  . Cancer Father        colon  . Cancer Brother        ?lung    Social History Social History  Substance Use Topics  . Smoking status: Never Smoker  . Smokeless tobacco: Never Used  . Alcohol use No     Allergies   Darvocet [propoxyphene n-acetaminophen]   Review of Systems Review of Systems  Constitutional: Negative for chills, diaphoresis and fever.  Respiratory: Positive for cough, chest tightness and shortness of breath.   Cardiovascular: Positive for chest pain and leg swelling. Negative for palpitations.  Gastrointestinal: Negative for abdominal pain, constipation, diarrhea, nausea and vomiting.  Musculoskeletal: Positive for arthralgias and myalgias. Negative for back pain, neck pain and neck stiffness.  Skin: Negative for rash and wound.  Neurological: Negative for dizziness, weakness, light-headedness, numbness and headaches.  All other systems reviewed and are negative.    Physical Exam Updated Vital Signs BP (!) 163/75   Pulse 60   Temp 98.2 F (36.8 C) (Oral)   Resp 17   Ht 5\' 5"  (1.651 m)   Wt 83 kg (183 lb)   SpO2 98%   BMI 30.45 kg/m   Physical Exam  Constitutional: She is oriented to person,  place, and time. She appears well-developed and well-nourished. No distress.  HENT:  Head: Normocephalic and atraumatic.  Mouth/Throat: Oropharynx is clear and moist. No oropharyngeal exudate.  Eyes: EOM are normal. Pupils are equal, round, and reactive to light.  Neck: Normal range of motion. Neck supple. No JVD present.  Cardiovascular: Normal rate and regular rhythm.  Exam reveals no gallop and no friction rub.   No murmur heard. Pulmonary/Chest: Effort normal and breath sounds normal. No respiratory distress. She has no wheezes. She has no rales. She exhibits no tenderness.  Abdominal: Soft. Bowel sounds are normal. There is no tenderness. There is no rebound and no guarding.  Musculoskeletal: Normal range of motion. She exhibits edema and tenderness.  Patient  has mild right lower extremity swelling compared to left and tenderness to palpation over the medial calf and popliteal region. Distal pulses are 2+.  Neurological: She is alert and oriented to person, place, and time.  Moving all extremities without focal deficit. Sensation fully intact.  Skin: Skin is warm and dry. Capillary refill takes less than 2 seconds. No rash noted. She is not diaphoretic. No erythema.  Psychiatric: She has a normal mood and affect. Her behavior is normal.  Nursing note and vitals reviewed.    ED Treatments / Results  Labs (all labs ordered are listed, but only abnormal results are displayed) Labs Reviewed  [place], ED    EKG  EKG Interpretation  Date/Time:  Friday September 23 2016 10:56:12 EDT Ventricular Rate:  72 PR Interval:  158 QRS Duration: 92 QT Interval:  414 QTC Calculation: 453 R Axis:   -48 Text Interpretation:  Normal sinus rhythm Left anterior fascicular block Moderate voltage criteria for LVH, may be normal variant Cannot rule out Septal infarct , age undetermined Abnormal ECG Confirmed by Lita Mains  MD, Alene Bergerson ([id number]) on 09/23/2016 12:06:42 PM         Radiology Dg Chest 2 View  Result Date: 09/23/2016 CLINICAL DATA:  Chest pain EXAM: CHEST - 2 VIEW COMPARISON:  04/28/2016 FINDINGS: Somewhat coarse and attenuated bronchovascular markings as before. No focal infiltrate or overt edema. Heart size upper limits normal. No effusion.  No pneumothorax. Anterior vertebral endplate spurring at multiple levels in the mid thoracic spine. IMPRESSION: No acute cardiopulmonary disease. Electronically Signed   By: Lucrezia Europe M.D.   On: 09/23/2016 11:38   Ct Angio Chest Pe W And/or Wo Contrast  Result Date: 09/23/2016 CLINICAL DATA:  Mid chest pain and 3 day history of leg swelling. EXAM: CT ANGIOGRAPHY CHEST WITH CONTRAST TECHNIQUE: Multidetector CT imaging of the chest was performed using the standard protocol during bolus administration of intravenous contrast. Multiplanar CT image reconstructions and MIPs were obtained to evaluate the vascular anatomy. CONTRAST:  100 cc Isovue 370 COMPARISON:  CT chest 10/07/2013 FINDINGS: Cardiovascular: Heart is enlarged. No pericardial effusion. No thoracic aortic aneurysm. No filling defect in the opacified pulmonary arteries to suggest the presence of an acute pulmonary embolus. Mediastinum/Nodes: No mediastinal lymphadenopathy. There is no hilar lymphadenopathy. The esophagus has normal imaging features. There is no axillary lymphadenopathy. Lungs/Pleura: Low lung volumes with probable compressive atelectasis dependently. Areas of mosaic attenuation in both lungs may be related to small airway disease. No pulmonary edema. No evidence of pleural effusion. Upper Abdomen: As seen on prior study, a large hepatic cysts are evident. Incompletely visualize 9 cm cyst is noted towards the hepatic dome. Musculoskeletal: Bone windows reveal no worrisome lytic or sclerotic osseous lesions. Review of the MIP images confirms the above findings. IMPRESSION: 1. No evidence for acute pulmonary embolus. 2. Scattered areas of mosaic  attenuation in the lungs may reflect small airways disease. 3. Multiple large hepatic cysts, similar to prior study. Electronically Signed   By: Misty Stanley M.D.   On: 09/23/2016 14:32    Procedures Procedures (including critical care time)  Medications Ordered in ED Medications  acetaminophen (TYLENOL) tablet 650 mg (not administered)  iopamidol (ISOVUE-370) 76 % injection (100 mLs  Contrast Given 09/23/16 1400)     Initial Impression / Assessment and Plan / ED Course  I have reviewed the triage vital signs and the nursing notes.  Pertinent labs & imaging results that were  available during my care of the patient were reviewed by me and considered in my medical decision making (see chart for details).     Patient's chest pain has been constant for the past 2 days. Associated with cough and mild shortness of breath. Likely URI related. CT angiogram without evidence of PE. Troponin is negative. Patient has nonspecific T wave inversions in inferior leads. Similar to previous EKGs. 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 Will need follow-up with her orthopedist. Return precautions have been given.  Final Clinical Impressions(s) / ED Diagnoses   Final diagnoses:  Atypical chest pain  Right calf pain    New Prescriptions New Prescriptions   No medications on file     Julianne Rice, MD 09/23/16 1515

## 2016-09-23 NOTE — ED Notes (Signed)
Pt transported to US

## 2016-09-23 NOTE — Progress Notes (Signed)
VASCULAR LAB PRELIMINARY  PRELIMINARY  PRELIMINARY  PRELIMINARY  Right lower extremity venous duplex completed.    Preliminary report:  No evidence of DVT or superficial thrombosis of the right lower extremity. There an area of mixed echoes and fluid in the popliteal fossa consistent with a possible Baker's cys.t  Brenda Flores, RVS 09/23/2016, 1:26 PM

## 2016-09-23 NOTE — ED Triage Notes (Signed)
Pt c/o Mid non radiating CP onset x 2 days, pt reports R leg pain & swelling onset x 2 days, pt had recent foot surgery for bone spur, pt told by Percell Miller, MD to come here for eval, pt reports SOB, denies n/v/d, A&O x4

## 2016-09-23 NOTE — ED Notes (Signed)
Patient transported to X-ray 

## 2016-09-23 NOTE — ED Notes (Signed)
Patient transported to CT 

## 2016-10-04 DIAGNOSIS — M25561 Pain in right knee: Secondary | ICD-10-CM | POA: Diagnosis not present

## 2016-10-27 ENCOUNTER — Other Ambulatory Visit: Payer: Self-pay | Admitting: Primary Care

## 2016-10-27 DIAGNOSIS — E785 Hyperlipidemia, unspecified: Secondary | ICD-10-CM

## 2016-10-27 DIAGNOSIS — E119 Type 2 diabetes mellitus without complications: Secondary | ICD-10-CM

## 2016-11-07 ENCOUNTER — Other Ambulatory Visit: Payer: Medicare Other

## 2016-11-10 ENCOUNTER — Encounter: Payer: Medicare Other | Admitting: Primary Care

## 2016-11-28 ENCOUNTER — Other Ambulatory Visit: Payer: Self-pay | Admitting: Primary Care

## 2016-11-28 ENCOUNTER — Encounter: Payer: Self-pay | Admitting: Primary Care

## 2016-11-28 ENCOUNTER — Ambulatory Visit (INDEPENDENT_AMBULATORY_CARE_PROVIDER_SITE_OTHER): Payer: Medicare Other | Admitting: Primary Care

## 2016-11-28 VITALS — BP 126/80 | HR 61 | Temp 98.3°F | Ht 65.0 in | Wt 186.8 lb

## 2016-11-28 DIAGNOSIS — E785 Hyperlipidemia, unspecified: Secondary | ICD-10-CM | POA: Diagnosis not present

## 2016-11-28 DIAGNOSIS — Z1211 Encounter for screening for malignant neoplasm of colon: Secondary | ICD-10-CM | POA: Diagnosis not present

## 2016-11-28 DIAGNOSIS — I10 Essential (primary) hypertension: Secondary | ICD-10-CM

## 2016-11-28 DIAGNOSIS — E119 Type 2 diabetes mellitus without complications: Secondary | ICD-10-CM | POA: Diagnosis not present

## 2016-11-28 DIAGNOSIS — Z0000 Encounter for general adult medical examination without abnormal findings: Secondary | ICD-10-CM | POA: Diagnosis not present

## 2016-11-28 DIAGNOSIS — E2839 Other primary ovarian failure: Secondary | ICD-10-CM

## 2016-11-28 DIAGNOSIS — I2510 Atherosclerotic heart disease of native coronary artery without angina pectoris: Secondary | ICD-10-CM | POA: Diagnosis not present

## 2016-11-28 DIAGNOSIS — Z1231 Encounter for screening mammogram for malignant neoplasm of breast: Secondary | ICD-10-CM | POA: Diagnosis not present

## 2016-11-28 DIAGNOSIS — Z1159 Encounter for screening for other viral diseases: Secondary | ICD-10-CM | POA: Diagnosis not present

## 2016-11-28 DIAGNOSIS — Z1239 Encounter for other screening for malignant neoplasm of breast: Secondary | ICD-10-CM

## 2016-11-28 DIAGNOSIS — Z23 Encounter for immunization: Secondary | ICD-10-CM

## 2016-11-28 MED ORDER — ZOSTER VAC RECOMB ADJUVANTED 50 MCG/0.5ML IM SUSR: 0.5000 mL | Freq: Once | INTRAMUSCULAR | 1 refills | Status: AC

## 2016-11-28 NOTE — Assessment & Plan Note (Signed)
Continue statin and BP control. Lipid panel pending.

## 2016-11-28 NOTE — Assessment & Plan Note (Signed)
Prevnar due, provided today. Rx for Shingrix printed, she will take this to her pharmacy in 1 month. Declines influenza vaccination. Mammogram and bone density due, ordered. Colonoscopy due, ordered. Eye exam due, referral placed. Discussed to increase vegetables, fruit, whole grains. Limit candy, sweets, sugary drinks. Discussed to advance exercise as directed per ortho. Exam unremarkable, labs pending. All recommendations provided at end of visit.   I have personally reviewed and have noted: 1. The patient's medical and social history 2. Their use of alcohol, tobacco or illicit drugs 3. Their current medications and supplements 4. The patient's functional ability including ADL's, fall  risks, home safety risks and  hearing or visual  impairment. 5. Diet and physical activities 6. Evidence for depression or mood disorder

## 2016-11-28 NOTE — Assessment & Plan Note (Signed)
Repeat A1C pending. Discussed to improve diet and start some form of activity. Prevnar 13 provided today. Managed on ACE and statin.

## 2016-11-28 NOTE — Assessment & Plan Note (Signed)
Lipid panel pending. Continue statin. Discussed the importance of a healthy diet and regular exercise in order for weight loss, and to reduce the risk of other medical problems.

## 2016-11-28 NOTE — Progress Notes (Signed)
Patient ID: Brenda Flores, female   DOB: [birth year], 71 y.o.   MRN: [id number]  HPI: Brenda Flores is a 71 year old female   Past Medical History:  Diagnosis Date  . Achilles rupture, right   . Bronchitis   . CAD (coronary artery disease) Dr Burt Knack   non-obs CAD by cath in 7/07 (mLAD 30-40%, EF 65%)  . DDD (degenerative disc disease), lumbar   . DJD (degenerative joint disease)    KNEES  . GERD (gastroesophageal reflux disease)   . History of mitral valve prolapse    PER PT REPORTED MVP  PER DR HARSHAW FROM CARDIAC CATH IN 1987 (APPROX)  . 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 Hyperlipidemia   . Hypertension   . Left nephrolithiasis    NON-OBSTRUCTIVE  . Liver, polycystic   . Lumbar radiculopathy   . Osteoporosis   . PONV (postoperative nausea and vomiting)   . Right ureteral stone   . Type 2 diabetes mellitus ([place])     Current Outpatient Prescriptions  Medication Sig Dispense Refill  . albuterol (PROVENTIL HFA;VENTOLIN HFA) 108 (90 BASE) MCG/ACT inhaler Inhale 1-2 puffs into the lungs every 6 (six) hours as needed for wheezing or shortness of breath. 1 Inhaler 0  . amLODipine (NORVASC) 10 MG tablet Take 1 tablet (10 mg total) by mouth at bedtime. 90 tablet 3  . carvedilol (COREG) 6.25 MG tablet Take 1 tablet (6.25 mg total) by mouth 2 (two) times daily with a meal. 60 tablet 3  . lisinopril (PRINIVIL,ZESTRIL) 5 MG tablet Take 1 tablet (5 mg total) by mouth daily. For kidney protection. 90 tablet 3  . oxyCODONE-acetaminophen (PERCOCET) 10-325 MG tablet Take 1-2 tabs po q4-6 hours prn pain 60 tablet 0  . promethazine (PHENERGAN) 25 MG tablet Take 1 Tablet By Mouth Every 4 to 6 Hours As Needed 30 tablet 0  . simvastatin (ZOCOR) 20 MG tablet Take 1 tablet (20 mg total) by mouth daily. 90 tablet 3  . sitaGLIPtin (JANUVIA) 100 MG tablet Take 1 tablet (100 mg total) by mouth at bedtime. 90 tablet 3  . traMADol (ULTRAM) 50  MG tablet Take 50 mg by mouth every 8 (eight) hours as needed for pain.  0   No current facility-administered medications for this visit.     Allergies  Allergen Reactions  . Darvocet [Propoxyphene N-Acetaminophen] Nausea And Vomiting    Family History  Problem Relation Age of Onset  . Cancer Mother        breast  . Cancer Father        colon  . Cancer Brother        ?lung    Social History   Social History  . Marital status: Widowed    Spouse name: N/A  . Number of children: N/A  . Years of education: N/A   Occupational History  . Not on file.   Social History Main Topics  . Smoking status: Never Smoker  . Smokeless tobacco: Never Used  . Alcohol use No  . Drug use: No  . Sexual activity: Not on file   Other Topics Concern  . Not on file   Social History Narrative  . No narrative on file    Hospitiliaztions: Emergency Department visit in March 2018  Health Maintenance:    Flu: Due, declines  Tetanus: Unsure, will check with insurance.  Pneumovax: Completed in 2016  Prevnar: Due  Zostavax: Never  completed.   Bone Density: Due.   Colonoscopy: Completed in 2012, due.   Eye Doctor: Due, has not been recently  Dental Exam: No recent exam.  Mammogram: Completed years ago. Due.  Pap: Hysterectomy      Providers: Dr. Percell Miller, Orthopedist; Alma Friendly, PCP   I have personally reviewed and have noted: 1. The patient's medical and social history 2. Their use of alcohol, tobacco or illicit drugs 3. Their current medications and supplements 4. The patient's functional ability including ADL's, fall risks, home safety risks  and hearing or visual impairment. 5. Diet and physical activities 6. Evidence for depression or mood disorder  Subjective:   Review of Systems:   Constitutional: Denies fever, malaise, fatigue, headache or abrupt weight changes.  HEENT: Denies eye pain, eye redness, ear pain, ringing in the ears, wax buildup, runny nose, nasal  congestion, bloody nose, or sore throat. Respiratory: Denies difficulty breathing, shortness of breath, cough or sputum production.   Cardiovascular: Denies chest pain, chest tightness, palpitations or swelling in the hands or feet.  Gastrointestinal: Denies abdominal pain, bloating, constipation, diarrhea or blood in the stool.  GU: Denies urgency, frequency, pain with urination, burning sensation, blood in urine, odor or discharge. Musculoskeletal: Decrease in range of motion to right knee. Doing better with healing of heel spurs to right foot. Skin: Denies redness, rashes, lesions or ulcercations.  Neurological: Denies dizziness, difficulty with memory, difficulty with speech or problems with balance and coordination.  Psychiatric: Denies concerns for anxiety or depression.   No other specific complaints in a complete review of systems (except as listed in HPI above).  Objective:  PE:   There were no vitals taken for this visit. Wt Readings from Last 3 Encounters:  09/23/16 183 lb (83 kg)  06/29/16 196 lb 12.8 oz (89.3 kg)  05/26/16 184 lb (83.5 kg)    General: Appears their stated age, well developed, well nourished in NAD. Skin: Warm, dry and intact. No rashes, lesions or ulcerations noted. HEENT: Head: normal shape and size; Eyes: sclera white, no icterus, conjunctiva pink, PERRLA and EOMs intact; Ears: Tm's gray and intact, normal light reflex; Nose: mucosa pink and moist, septum midline; Throat/Mouth: Teeth present, mucosa pink and moist, no exudate, lesions or ulcerations noted.  Neck: Normal range of motion. Neck supple, trachea midline. No massses, lumps or thyromegaly present.  Cardiovascular: Normal rate and rhythm. S1,S2 noted.  No murmur, rubs or gallops noted. No JVD or BLE edema. No carotid bruits noted. Pulmonary/Chest: Normal effort and positive vesicular breath sounds. No respiratory distress. No wheezes, rales or ronchi noted.  Abdomen: Soft and nontender. Normal  bowel sounds, no bruits noted. No distention or masses noted. Liver, spleen and kidneys non palpable. Musculoskeletal: Normal range of motion. No signs of joint swelling. No difficulty with gait.  Neurological: Alert and oriented. Cranial nerves II-XII intact. Coordination normal. +DTRs bilaterally. Psychiatric: Mood and affect normal. Behavior is normal. Judgment and thought content normal.    BMET    Component Value Date/Time   NA 139 09/23/2016 1155   K 3.7 09/23/2016 1155   CL 105 09/23/2016 1155   CO2 27 09/23/2016 1155   GLUCOSE 91 09/23/2016 1155   BUN 12 09/23/2016 1155   CREATININE 0.77 09/23/2016 1155   CALCIUM 9.0 09/23/2016 1155   GFRNONAA >60 09/23/2016 1155   GFRAA >60 09/23/2016 1155    Lipid Panel     Component Value Date/Time   CHOL 229 (H) 05/11/2016 1141   TRIG  163.0 (H) 05/11/2016 1141   HDL 48.20 05/11/2016 1141   CHOLHDL 5 05/11/2016 1141   VLDL 32.6 05/11/2016 1141   LDLCALC 149 (H) 05/11/2016 1141    CBC    Component Value Date/Time   WBC 5.9 09/23/2016 1155   RBC 4.50 09/23/2016 1155   HGB 13.0 09/23/2016 1155   HCT 39.9 09/23/2016 1155   PLT 214 09/23/2016 1155   MCV 88.7 09/23/2016 1155   MCH 28.9 09/23/2016 1155   MCHC 32.6 09/23/2016 1155   RDW 14.6 09/23/2016 1155   LYMPHSABS 2.3 07/11/2015 1112   MONOABS 0.3 07/11/2015 1112   EOSABS 0.2 07/11/2015 1112   BASOSABS 0.0 07/11/2015 1112    Hgb A1C Lab Results  Component Value Date   HGBA1C 6.3 05/11/2016      Assessment and Plan:   Medicare Annual Wellness Visit:  Diet: She endorses a fair diet. Breakfast: Oatmeal, boiled egg, bacon, grits Lunch: Meat, vegetable, starch Dinner: Yogurt, sandwich Snacks: Sweets Desserts: Daily, cookies, cakes Beverages: Water, occasional Pepsi Physical activity: Active but not regularly exercising given right knee pain. Depression/mood screen: Negative Hearing: Intact to whispered voice Visual acuity: Grossly normal, due for eye  exam. ADLs: Capable Fall risk: None Home safety: Good Cognitive evaluation: Intact to orientation, naming, recall and repetition EOL planning: Adv directives, full code/ I agree  Preventative Medicine: Prevnar due, provided today. Rx for Shingrix printed, she will take this to her pharmacy in 1 month. Declines influenza vaccination. Mammogram and bone density due, ordered. Colonoscopy due, ordered. Eye exam due, referral placed. Discussed to increase vegetables, fruit, whole grains. Limit candy, sweets, sugary drinks. Discussed to advance exercise as directed per ortho. Exam unremarkable, labs pending. All recommendations provided at end of visit.   Next appointment: 6 months.

## 2016-11-28 NOTE — Patient Instructions (Addendum)
You were provided with a pneumonia vaccination today.  Call the [facility] to schedule your mammogram and bone density testing.  You will be contacted regarding your referral to GI for the colonoscopy and to the eye doctor.  Please let us know if you have not heard back within one week.   Start exercising. You should be getting 150 minutes of exercise weekly.  Ensure you are consuming 64 ounces of water daily.  Take the Shingles vaccination to the pharmacy in 1 month.  Follow up in 6 months for re-evaluation or sooner if needed.  It was a pleasure to see you today!

## 2016-11-28 NOTE — Assessment & Plan Note (Signed)
Stable in the office today, continue lisinopril, carvedilol, amlodipine. BMP pending.

## 2016-11-29 ENCOUNTER — Other Ambulatory Visit: Payer: Self-pay | Admitting: Primary Care

## 2016-11-29 DIAGNOSIS — E785 Hyperlipidemia, unspecified: Secondary | ICD-10-CM

## 2016-11-29 LAB — LIPID PANEL
Cholesterol: 242 mg/dL — ABNORMAL HIGH (ref 0–200)
HDL: 42.1 mg/dL (ref >39.00)
LDL Cholesterol: 167 mg/dL — ABNORMAL HIGH (ref 0–99)
NonHDL: 200.22
Total CHOL/HDL Ratio: 6
Triglycerides: 166 mg/dL — ABNORMAL HIGH (ref 0.0–149.0)
VLDL: 33.2 mg/dL (ref 0.0–40.0)

## 2016-11-29 LAB — COMPREHENSIVE METABOLIC PANEL
ALT: 13 U/L (ref 0–35)
AST: 18 U/L (ref 0–37)
Albumin: 3.9 g/dL (ref 3.5–5.2)
Alkaline Phosphatase: 51 U/L (ref 39–117)
BUN: 20 mg/dL (ref 6–23)
CO2: 32 mEq/L (ref 19–32)
Calcium: 9.4 mg/dL (ref 8.4–10.5)
Chloride: 104 mEq/L (ref 96–112)
Creatinine, Ser: 0.85 mg/dL (ref 0.40–1.20)
GFR: 85.06 mL/min (ref >60.00)
Glucose, Bld: 75 mg/dL (ref 70–99)
Potassium: 4 mEq/L (ref 3.5–5.1)
Sodium: 142 mEq/L (ref 135–145)
Total Bilirubin: 0.3 mg/dL (ref 0.2–1.2)
Total Protein: 7.1 g/dL (ref 6.0–8.3)

## 2016-11-29 LAB — HEMOGLOBIN A1C: HEMOGLOBIN A1C: 6.4 % (ref 4.6–6.5)

## 2016-11-29 LAB — HEPATITIS C ANTIBODY
Hepatitis C Ab: NONREACTIVE
SIGNAL TO CUT-OFF: 0.04 (ref <1.00)

## 2016-11-29 MED ORDER — ATORVASTATIN CALCIUM 20 MG PO TABS: 20.0000 mg | ORAL_TABLET | Freq: Every day | ORAL | 3 refills | Status: DC

## 2016-11-29 NOTE — Addendum Note (Signed)
Addended by: Jacqualin Combes on: 11/29/2016 07:50 AM   Modules accepted: Orders

## 2016-12-05 ENCOUNTER — Encounter: Payer: Self-pay | Admitting: *Deleted

## 2016-12-21 ENCOUNTER — Other Ambulatory Visit: Payer: Self-pay | Admitting: Primary Care

## 2016-12-29 ENCOUNTER — Ambulatory Visit: Payer: Medicare Other

## 2016-12-29 ENCOUNTER — Other Ambulatory Visit: Payer: Medicare Other

## 2017-03-07 DIAGNOSIS — M79642 Pain in left hand: Secondary | ICD-10-CM | POA: Diagnosis not present

## 2017-03-07 DIAGNOSIS — M79641 Pain in right hand: Secondary | ICD-10-CM | POA: Diagnosis not present

## 2017-03-10 ENCOUNTER — Other Ambulatory Visit: Payer: Medicare Other

## 2017-03-10 ENCOUNTER — Ambulatory Visit: Payer: Medicare Other

## 2017-04-06 ENCOUNTER — Encounter: Payer: Self-pay | Admitting: Primary Care

## 2017-04-12 ENCOUNTER — Ambulatory Visit: Payer: Self-pay | Admitting: *Deleted

## 2017-04-12 NOTE — Telephone Encounter (Signed)
C/o shortness of breathe. Happens with exertion. She denies chest pain and states he only hx of lung dx is asthma.  Appointment made for tomorrow per pt request.  Home care advice given to her with verbal understanding.  Reason for Disposition . [1] MILD difficulty breathing (e.g., minimal/no SOB at rest, SOB with walking, pulse <100) AND [2] NEW-onset or WORSE than normal  Answer Assessment - Initial Assessment Questions 1. RESPIRATORY STATUS: "Describe your breathing?" (e.g., wheezing, shortness of breath, unable to speak, severe coughing)      Ok as long as she is still 2. ONSET: "When did this breathing problem begin?"      Last night 3. PATTERN "Does the difficult breathing come and go, or has it been constant since it started?"      Comes with exertion 4. SEVERITY: "How bad is your breathing?" (e.g., mild, moderate, severe)    - MILD: No SOB at rest, mild SOB with walking, speaks normally in sentences, can lay down, no retractions, pulse < 100.    - MODERATE: SOB at rest, SOB with minimal exertion and prefers to sit, cannot lie down flat, speaks in phrases, mild retractions, audible wheezing, pulse 100-120.    - SEVERE: Very SOB at rest, speaks in single words, struggling to breathe, sitting hunched forward, retractions, pulse > 120      Mild while resting 5. RECURRENT SYMPTOM: "Have you had difficulty breathing before?" If so, ask: "When was the last time?" and "What happened that time?"      A long time ago, sent to heart doctor and they said heart was ok 6. CARDIAC HISTORY: "Do you have any history of heart disease?" (e.g., heart attack, angina, bypass surgery, angioplasty)      catherization done 20 years 7. LUNG HISTORY: "Do you have any history of lung disease?"  (e.g., pulmonary embolus, asthma, emphysema)     asthma 8. CAUSE: "What do you think is causing the breathing problem?"      Not sure 9. OTHER SYMPTOMS: "Do you have any other symptoms? (e.g., dizziness, runny nose,  cough, chest pain, fever)     cough 10. PREGNANCY: "Is there any chance you are pregnant?" "When was your last menstrual period?"       n/a 11. TRAVEL: "Have you traveled out of the country in the last month?" (e.g., travel history, exposures)       no  Protocols used: BREATHING DIFFICULTY-A-AH

## 2017-04-13 ENCOUNTER — Encounter: Payer: Self-pay | Admitting: Internal Medicine

## 2017-04-13 ENCOUNTER — Ambulatory Visit (INDEPENDENT_AMBULATORY_CARE_PROVIDER_SITE_OTHER): Payer: Medicare Other | Admitting: Internal Medicine

## 2017-04-13 VITALS — BP 124/76 | HR 67 | Temp 97.9°F | Wt 181.0 lb

## 2017-04-13 DIAGNOSIS — J069 Acute upper respiratory infection, unspecified: Secondary | ICD-10-CM

## 2017-04-13 DIAGNOSIS — B9789 Other viral agents as the cause of diseases classified elsewhere: Secondary | ICD-10-CM

## 2017-04-13 MED ORDER — ALBUTEROL SULFATE HFA 108 (90 BASE) MCG/ACT IN AERS: 1.0000 | INHALATION_SPRAY | Freq: Four times a day (QID) | RESPIRATORY_TRACT | 0 refills | Status: DC | PRN

## 2017-04-13 NOTE — Progress Notes (Signed)
HPI  Pt presents to the clinic today with c/o runny nose, scratchy throat and cough. This started 3 days ago ago. She is blowing clear mucous out of her nose. She denies difficulty swallowing. The cough is non productive. She reports she is mildly short of breath, mainly with exertion. She denies chest pain or chest tightness. She denies fever, chills or body aches. She has tried Diabetic Tussin with some relief. She has a history of CAD, GERD, CHF, asthma, DM2. She has had sick contacts.  Review of Systems      Past Medical History:  Diagnosis Date  . Achilles rupture, right   . Bronchitis   . CAD (coronary artery disease) Dr Burt Knack   non-obs CAD by cath in 7/07 (mLAD 30-40%, EF 65%)  . DDD (degenerative disc disease), lumbar   . DJD (degenerative joint disease)    KNEES  . GERD (gastroesophageal reflux disease)   . History of mitral valve prolapse    PER PT REPORTED MVP  PER DR HARSHAW FROM CARDIAC CATH IN 1987 (APPROX)  . 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 Hyperlipidemia   . Hypertension   . Left nephrolithiasis    NON-OBSTRUCTIVE  . Liver, polycystic   . Lumbar radiculopathy   . Osteoporosis   . PONV (postoperative nausea and vomiting)   . Right ureteral stone   . Type 2 diabetes mellitus ([facility])     Family History  Problem Relation Age of Onset  . Cancer Mother        breast  . Cancer Father        colon  . Cancer Brother        ?lung    Social History   Socioeconomic History  . Marital status: Widowed    Spouse name: Not on file  . Number of children: Not on file  . Years of education: Not on file  . Highest education level: Not on file  Social Needs  . Financial resource strain: Not on file  . Food insecurity - worry: Not on file  . Food insecurity - inability: Not on file  . Transportation needs - medical: Not on file  . Transportation needs - non-medical: Not on file  Occupational History  . Not on  file  Tobacco Use  . Smoking status: Never Smoker  . Smokeless tobacco: Never Used  Substance and Sexual Activity  . Alcohol use: No    Alcohol/week: 0.0 oz  . Drug use: No  . Sexual activity: Not on file  Other Topics Concern  . Not on file  Social History Narrative  . Not on file    Allergies  Allergen Reactions  . Darvocet [Propoxyphene N-Acetaminophen] Nausea And Vomiting     Constitutional: Denies headache, fatigue, fever or abrupt weight changes.  HEENT:  Positive runny nose, sore throat. Denies eye redness, eye pain, pressure behind the eyes, facial pain, nasal congestion, ear pain, ringing in the ears, wax buildup, runny nose or bloody nose. Respiratory: Positive cough and shortness of breath. Denies difficulty breathing.  Cardiovascular: Denies chest pain, chest tightness, palpitations or swelling in the hands or feet.   No other specific complaints in a complete review of systems (except as listed in HPI above).  Objective:   BP 124/76   Pulse 67   Temp 97.9 F (36.6 C) (Oral)   Wt 181 lb (82.1 kg)   SpO2 97%   BMI 30.12 kg/m  Wt Readings from Last 3 Encounters:  04/13/17 181 lb (82.1 kg)  11/28/16 186 lb 12.8 oz (84.7 kg)  09/23/16 183 lb (83 kg)     General: Appears her stated age, in NAD. HEENT: Head: normal shape and size, no sinus tenderness noted; Ears: Tm's gray and intact, normal light reflex; Nose: mucosa boggy and moist, turbinates swollen; Throat/Mouth: Teeth present, mucosa pink and moist, no exudate noted, no lesions or ulcerations noted.  Neck: No cervical lymphadenopathy.  Cardiovascular: Normal rate and rhythm. S1,S2 noted.  Murmur noted.  Pulmonary/Chest: Normal effort and positive vesicular breath sounds. No respiratory distress. No wheezes, rales or ronchi noted.       Assessment & Plan:   Viral Upper Respiratory Infection with Cough:  Get some rest and drink plenty of water Start Zyrtec and Flonase OTC Albuterol refilled  today Continue Diabetic Tussin for cough  RTC as needed or if symptoms persist.   Webb Silversmith, NP

## 2017-04-13 NOTE — Patient Instructions (Signed)

## 2017-05-29 ENCOUNTER — Encounter: Payer: Self-pay | Admitting: Primary Care

## 2017-05-29 ENCOUNTER — Ambulatory Visit (INDEPENDENT_AMBULATORY_CARE_PROVIDER_SITE_OTHER): Payer: Medicare Other | Admitting: Primary Care

## 2017-05-29 VITALS — BP 122/76 | HR 67 | Temp 98.0°F | Ht 65.0 in | Wt 189.8 lb

## 2017-05-29 DIAGNOSIS — E1129 Type 2 diabetes mellitus with other diabetic kidney complication: Secondary | ICD-10-CM

## 2017-05-29 DIAGNOSIS — I10 Essential (primary) hypertension: Secondary | ICD-10-CM | POA: Diagnosis not present

## 2017-05-29 DIAGNOSIS — D229 Melanocytic nevi, unspecified: Secondary | ICD-10-CM

## 2017-05-29 DIAGNOSIS — I5030 Unspecified diastolic (congestive) heart failure: Secondary | ICD-10-CM | POA: Diagnosis not present

## 2017-05-29 DIAGNOSIS — M1712 Unilateral primary osteoarthritis, left knee: Secondary | ICD-10-CM | POA: Diagnosis not present

## 2017-05-29 DIAGNOSIS — E119 Type 2 diabetes mellitus without complications: Secondary | ICD-10-CM

## 2017-05-29 DIAGNOSIS — R809 Proteinuria, unspecified: Secondary | ICD-10-CM | POA: Diagnosis not present

## 2017-05-29 DIAGNOSIS — E785 Hyperlipidemia, unspecified: Secondary | ICD-10-CM

## 2017-05-29 MED ORDER — SITAGLIPTIN PHOSPHATE 100 MG PO TABS: ORAL_TABLET | ORAL | 3 refills | Status: DC

## 2017-05-29 MED ORDER — AMLODIPINE BESYLATE 10 MG PO TABS: ORAL_TABLET | ORAL | 3 refills | Status: DC

## 2017-05-29 MED ORDER — FREESTYLE LIBRE 14 DAY READER DEVI: 1.0000 | 0 refills | Status: DC

## 2017-05-29 MED ORDER — MELOXICAM 15 MG PO TABS: ORAL_TABLET | ORAL | 0 refills | Status: DC

## 2017-05-29 MED ORDER — FREESTYLE LIBRE 14 DAY SENSOR MISC: 1.0000 | 0 refills | Status: DC

## 2017-05-29 MED ORDER — LISINOPRIL 5 MG PO TABS: 5.0000 mg | ORAL_TABLET | Freq: Every day | ORAL | 3 refills | Status: DC

## 2017-05-29 MED ORDER — ATORVASTATIN CALCIUM 20 MG PO TABS: ORAL_TABLET | ORAL | 3 refills | Status: DC

## 2017-05-29 NOTE — Assessment & Plan Note (Addendum)
Repeat A1C pending, last A1C at goal. Foot exam today unremarkable. Managed on ACE and statin. Pneumonia vaccination UTD. Referral placed to optometry for diabetic eye exam.  Discussed the importance of a healthy diet and regular exercise in order for weight loss, and to reduce the risk of any potential medical problems.  Follow up in 6 months.

## 2017-05-29 NOTE — Assessment & Plan Note (Signed)
Asymptomatic. Managed on beta blocker. Echo from 2016 unremarkable. Continue to monitor.

## 2017-05-29 NOTE — Assessment & Plan Note (Signed)
Stable in the office today, continue lisinopril, amlodipine, carvedilol. BMP up to date.

## 2017-05-29 NOTE — Patient Instructions (Addendum)
Stop by the front desk and speak with either Rosaria Ferries or Shirlean Mylar regarding your referral to Dermatology and Optometry.  Stop by the lab prior to leaving today. I will notify you of your results once received.   Try Meloxicam 15 mg tablets once daily for arthritis pain. Do not take any Ibuprofen, naproxen, Aleve, Motrin. You can take Tylenol Arthritis as needed for breakthrough pain.   Continue following with orthopedics.   Start checking your blood sugars with the new reader as discussed. You can check in the morning when you wake, 2 hours after a meal, bedtime.  Please schedule a follow up appointment in 6 months.   It was a pleasure to see you today!

## 2017-05-29 NOTE — Progress Notes (Signed)
Subjective:    Patient ID: Brenda Flores, female    DOB: 09/01/1945, 72 y.o.   MRN: [id number]  HPI  [name] is a 72 year old female who presents today for follow up. She'd also like a referral to Dermatology for removal of facial moles. She is also needing to establish with an optometrist for diabetic eye exam.  1) Type 2 Diabetes:   Current medications include: Januvia 100 mg.  She does not check her blood sugars but is interested in the new [place] machine.   Last A1C: 6.4 in September 2019 Last Eye Exam: No recent exam. Last Foot Exam: Due Pneumonia Vaccination: Completed in 2018 ACE/ARB: Lisinopril  Statin: Lipitor  Diet currently consists of:  Breakfast: Boiled egg, bacon, toast, oatmeal, sometimes skips,  Lunch: Sandwich, vegetables, salad, protein (fried chicken)  Dinner: Meat, vegetable, salads Snacks: Popcorn, chips Desserts: Cookies, daily Beverages: Soda, sweet tea, water  Exercise: She is walking, and will start aerobics    2) Essential Hypertension/CHF: Currently managed on lisinopril 5 mg, amlodipine 10 mg, carvedilol 6.25 mg BID. She denies chest pain, dizziness, shortness of breath.   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 Pain is worse when standing after prolonged periods of sitting/laying. She's taking Ibuprofen 800 mg every evening with improvement. Currently following with orthopedics through [facility] who recommends surgery to right medial knee and injections to the hands. Tramadol is no longer helping with pain.    Review of Systems  Eyes: Negative for visual disturbance.  Respiratory: Negative for shortness of breath.   Cardiovascular: Negative for chest pain.  Musculoskeletal: Positive for arthralgias.  Neurological: Negative for dizziness and headaches.       Past Medical History:  Diagnosis Date  .  Achilles rupture, right   . Bronchitis   . CAD (coronary artery disease) Dr Burt Knack   non-obs CAD by cath in 7/07 (mLAD 30-40%, EF 65%)  . DDD (degenerative disc disease), lumbar   . DJD (degenerative joint disease)    KNEES  . GERD (gastroesophageal reflux disease)   . History of mitral valve prolapse    PER PT REPORTED MVP  PER DR HARSHAW FROM CARDIAC CATH IN 1987 (APPROX)  . 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 Hyperlipidemia   . Hypertension   . Left nephrolithiasis    NON-OBSTRUCTIVE  . Liver, polycystic   . Lumbar radiculopathy   . Osteoporosis   . PONV (postoperative nausea and vomiting)   . Right ureteral stone   . Type 2 diabetes mellitus ([place])      Social History   Socioeconomic History  . Marital status: Widowed    Spouse name: Not on file  . Number of children: Not on file  . Years of education: Not on file  . Highest education level: Not on file  Social Needs  . Financial resource strain: Not on file  . Food insecurity - worry: Not on file  . Food insecurity - inability: Not on file  . Transportation needs - medical: Not on file  . Transportation needs - non-medical: Not on file  Occupational History  . Not on file  Tobacco Use  . Smoking status: Never Smoker  . Smokeless tobacco: Never Used  Substance and Sexual Activity  . Alcohol use: No    Alcohol/week: 0.0 oz  .  Drug use: No  . Sexual activity: Not on file  Other Topics Concern  . Not on file  Social History Narrative  . Not on file    Past Surgical History:  Procedure Laterality Date  . ACHILLES TENDON SURGERY Right 05/26/2016   Procedure: RIGHT ACHILLES TENDON REPAIR,CALCANEUS SPUR EXCISION;  Surgeon: Ninetta Lights, MD;  Location: [facility];  Service: Orthopedics;  Laterality: Right;  . CARDIAC CATHETERIZATION  10-17-2005   DR COOPER   NON-OBSTRUCTIVE CAD/   mLAD 30-40%/   NORMAL LVF/  EF 65%  . CARDIOVASCULAR STRESS TEST   04-12-2011  DR Carleene Overlie TAYLOR   LOW RISK STUDY/  NO ISCHEMIA/  EF 68%  . CATARACT EXTRACTION W/ INTRAOCULAR LENS IMPLANT Right 2003  . COLONOSCOPY W/ POLYPECTOMY  03-17-2011  . CYSTOSCOPY WITH URETEROSCOPY Right 06/14/2013   Procedure: RIGHT  URETEROSCOPY,ureteral and urethral dilitation;  Surgeon: Claybon Jabs, MD;  Location: [facility];  Service: Urology;  Laterality: Right;  . HEEL SPUR RESECTION Right 05/26/2016   Procedure: HEEL SPUR EXCISION;  Surgeon: Ninetta Lights, MD;  Location: [facility];  Service: Orthopedics;  Laterality: Right;  . HEEL SPUR SURGERY Left 2003  . TOTAL ABDOMINAL HYSTERECTOMY W/ BILATERAL SALPINGOOPHORECTOMY  1978  . TRANSTHORACIC ECHOCARDIOGRAM  04-07-2011   MILD LVH/  MILD FOCAL BASAL SEPTAL HYPERTROPHY/  EF  55-60% /  GRADE I DIASTOLIC DYSFUNCTION    Family History  Problem Relation Age of Onset  . Cancer Mother        breast  . Cancer Father        colon  . Cancer Brother        ?lung    Allergies  Allergen Reactions  . Darvocet [Propoxyphene N-Acetaminophen] Nausea And Vomiting    Current Outpatient Medications on File Prior to Visit  Medication Sig Dispense Refill  . albuterol (PROVENTIL HFA;VENTOLIN HFA) 108 (90 Base) MCG/ACT inhaler Inhale 1-2 puffs into the lungs every 6 (six) hours as needed for wheezing or shortness of breath. 1 Inhaler 0  . carvedilol (COREG) 6.25 MG tablet TAKE 1 TABLET (6.25 MG TOTAL) BY MOUTH 2 (TWO) TIMES DAILY WITH A MEAL. 60 tablet 3  . traMADol (ULTRAM) 50 MG tablet Take 50 mg by mouth every 8 (eight) hours as needed for pain.  0   No current facility-administered medications on file prior to visit.     BP 122/76   Pulse 67   Temp 98 F (36.7 C) (Oral)   Ht 5\' 5"  (1.651 m)   Wt 189 lb 12.8 oz (86.1 kg)   SpO2 99%   BMI 31.58 kg/m    Objective:   Physical Exam  Constitutional: She appears well-nourished.  Neck: Neck supple.  Cardiovascular: Normal rate and regular rhythm.   Pulmonary/Chest: Effort normal and breath sounds normal.  Skin: Skin is warm and dry.  Numerous nevi to bilateral face.  Psychiatric: She has a normal mood and affect.          Assessment & Plan:

## 2017-05-29 NOTE — Assessment & Plan Note (Signed)
Chronic to bilateral knees for years. Stiffness to biltaeral medial knees, also to hands. Following with orthopedics who would like to do surgery to the right medial knee. Also considering injections to hands.   Tramadol no longer helping. Does take Ibuprofen 800 mg at bedtime with improvement, has not tried Tylenol Arthritis, Meloxicam, Diclofenac.   Will trial Meloxicam 15 mg once daily, discussed to avoid other NSAID's, try Tylenol Arthritis for breakthrough pain. Continue following with orthopedics.

## 2017-05-29 NOTE — Assessment & Plan Note (Signed)
Lipids from September 2018 stable, continue atorvastatin.

## 2017-05-30 LAB — HEMOGLOBIN A1C: Hgb A1c MFr Bld: 6.2 % (ref 4.6–6.5)

## 2017-05-31 ENCOUNTER — Encounter: Payer: Self-pay | Admitting: *Deleted

## 2017-06-21 ENCOUNTER — Ambulatory Visit: Payer: Medicare Other

## 2017-06-21 ENCOUNTER — Other Ambulatory Visit: Payer: Medicare Other

## 2017-06-21 DIAGNOSIS — D485 Neoplasm of uncertain behavior of skin: Secondary | ICD-10-CM | POA: Diagnosis not present

## 2017-06-21 DIAGNOSIS — D229 Melanocytic nevi, unspecified: Secondary | ICD-10-CM | POA: Diagnosis not present

## 2017-06-21 DIAGNOSIS — L820 Inflamed seborrheic keratosis: Secondary | ICD-10-CM | POA: Diagnosis not present

## 2017-06-21 DIAGNOSIS — L821 Other seborrheic keratosis: Secondary | ICD-10-CM | POA: Diagnosis not present

## 2017-06-30 ENCOUNTER — Other Ambulatory Visit (INDEPENDENT_AMBULATORY_CARE_PROVIDER_SITE_OTHER): Payer: Self-pay | Admitting: Physician Assistant

## 2017-07-17 ENCOUNTER — Ambulatory Visit
Admission: RE | Admit: 2017-07-17 | Discharge: 2017-07-17 | Disposition: A | Discharge status: Home / Self Care | Payer: Medicare Other | Source: Ambulatory Visit | Attending: Primary Care | Admitting: Primary Care

## 2017-07-17 DIAGNOSIS — E2839 Other primary ovarian failure: Secondary | ICD-10-CM

## 2017-07-17 DIAGNOSIS — Z1231 Encounter for screening mammogram for malignant neoplasm of breast: Secondary | ICD-10-CM | POA: Diagnosis not present

## 2017-07-17 DIAGNOSIS — Z780 Asymptomatic menopausal state: Secondary | ICD-10-CM | POA: Diagnosis not present

## 2017-07-17 DIAGNOSIS — M8589 Other specified disorders of bone density and structure, multiple sites: Secondary | ICD-10-CM | POA: Diagnosis not present

## 2017-07-18 ENCOUNTER — Encounter: Payer: Self-pay | Admitting: *Deleted

## 2017-07-30 IMAGING — CT CT ANGIO CHEST
2 of 6 series · 18 of 46 positions shown · IV contrast (APPLIED)
Comparison: CT chest 10/07/2013

CLINICAL DATA: Mid chest pain and 3 day history of leg swelling.

EXAM:
CT ANGIOGRAPHY CHEST WITH CONTRAST
TECHNIQUE: Multidetector CT imaging of the chest was performed using the
standard protocol during bolus administration of intravenous
contrast. Multiplanar CT image reconstructions and MIPs were
obtained to evaluate the vascular anatomy.
CONTRAST:  100 cc Isovue 370

[Series 13: thins · axial · 0.77mm/px · z∈[+934,+1112]mm · 15 of 280 slices shown]
[im 13/280  lung]
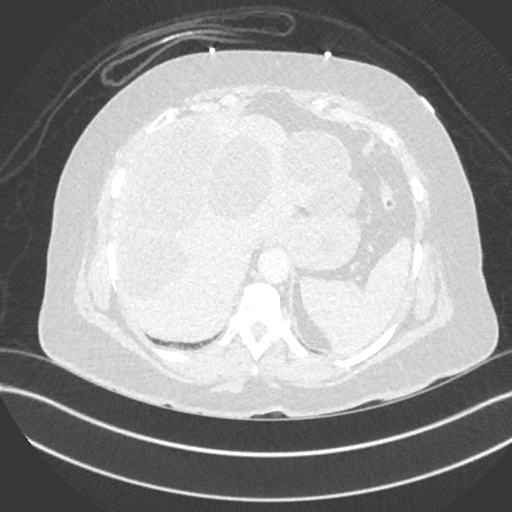
[im 37/280  soft-tissue]
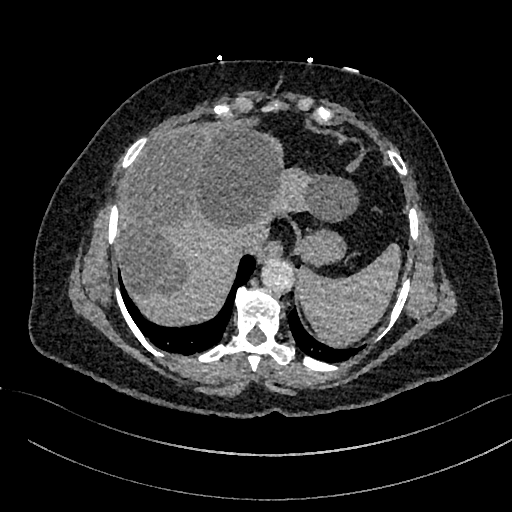
[im 49/280  lung]
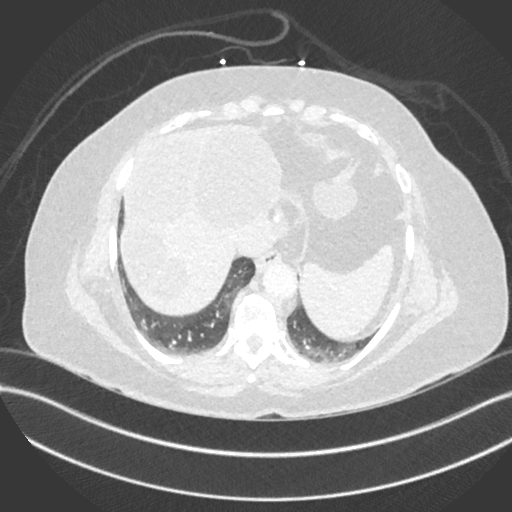
[im 73/280  soft-tissue]
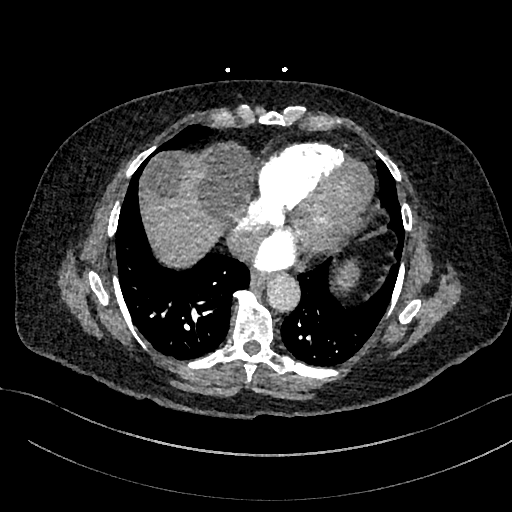
[im 85/280  lung]
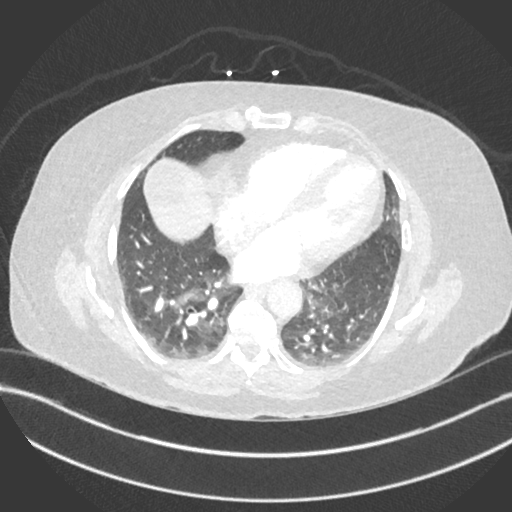
[im 110/280  soft-tissue]
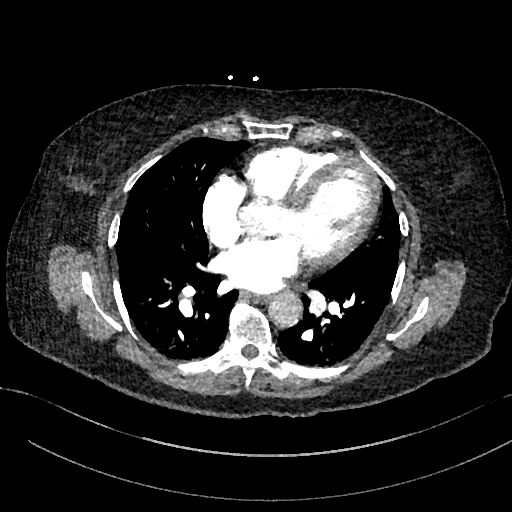
[im 122/280  lung]
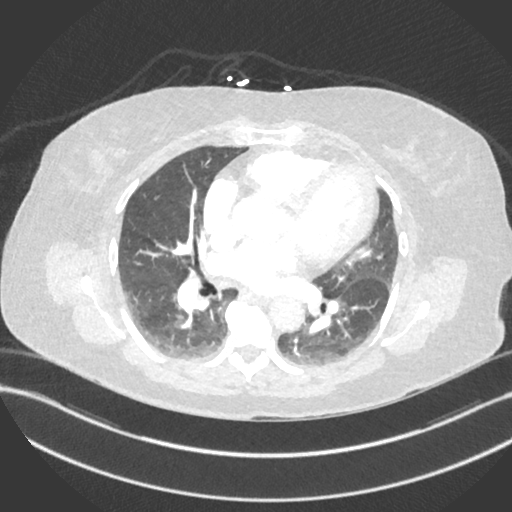
[im 146/280  soft-tissue]
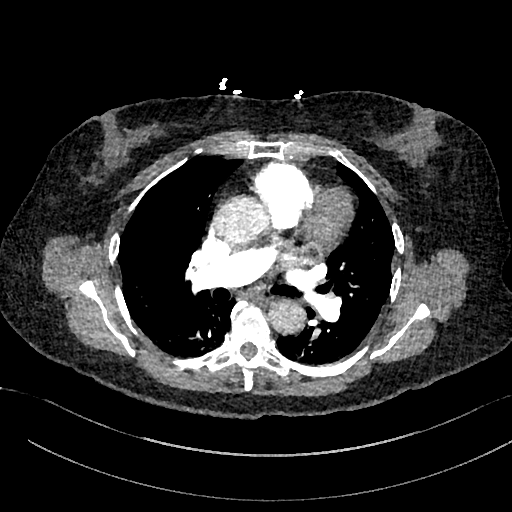
[im 158/280  lung]
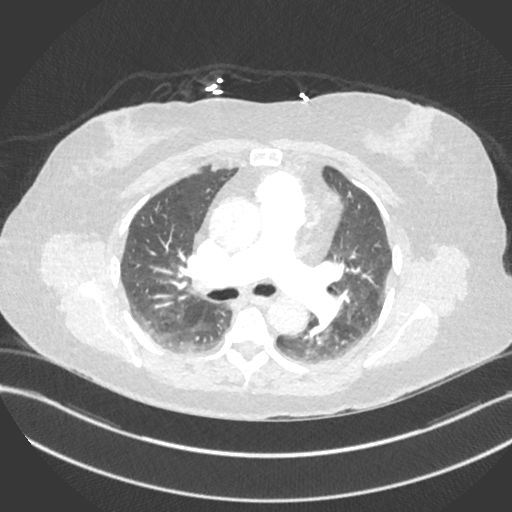
[im 170/280  soft-tissue]
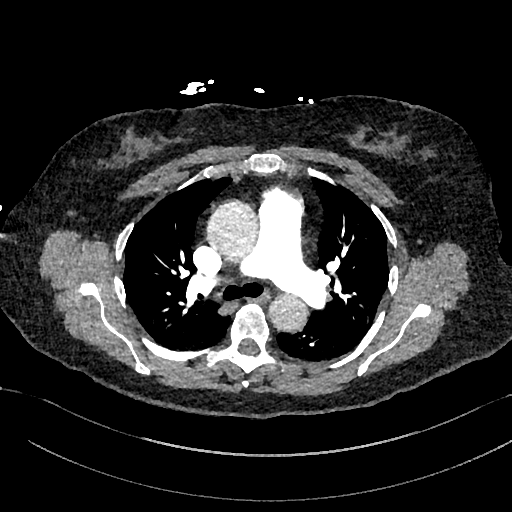
[im 195/280  lung]
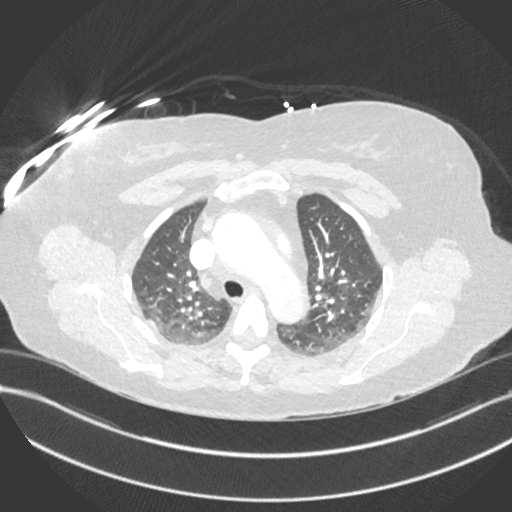
[im 207/280  soft-tissue]
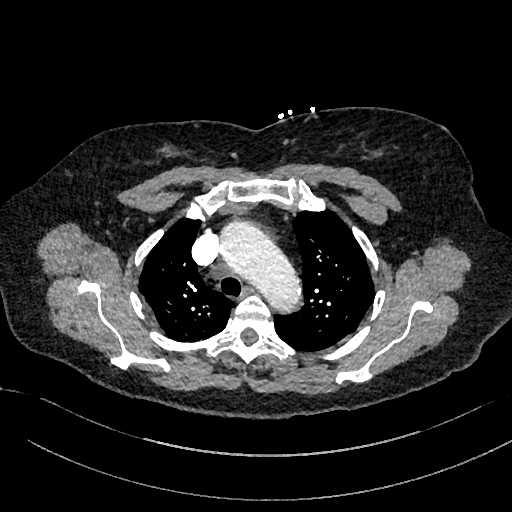
[im 231/280  lung]
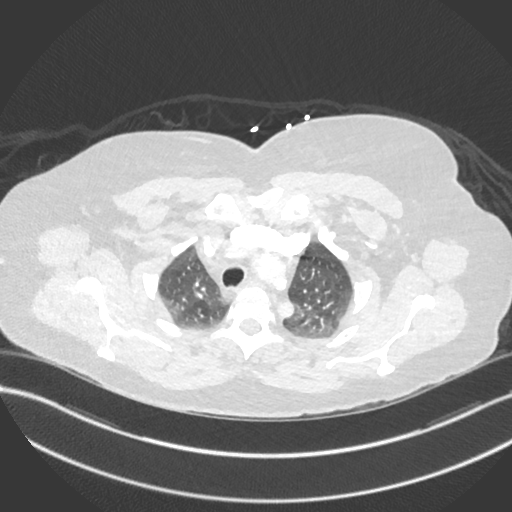
[im 243/280  soft-tissue]
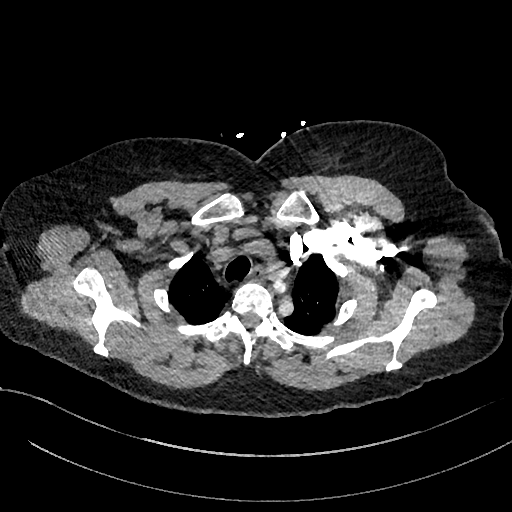
[im 267/280  lung]
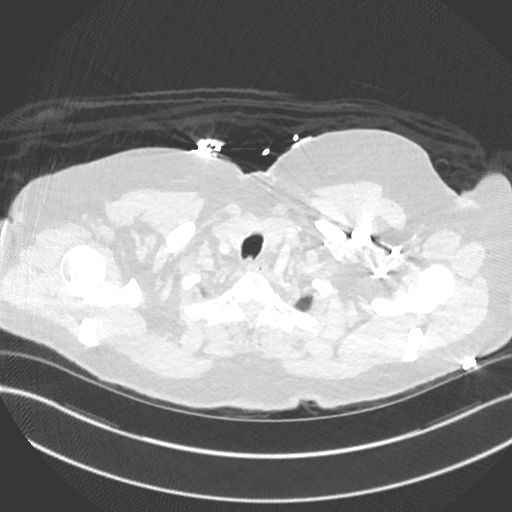

[Series 14: cor · coronal · 0.41mm/px · 3 of 143 slices shown]
[im 36/143  soft-tissue]
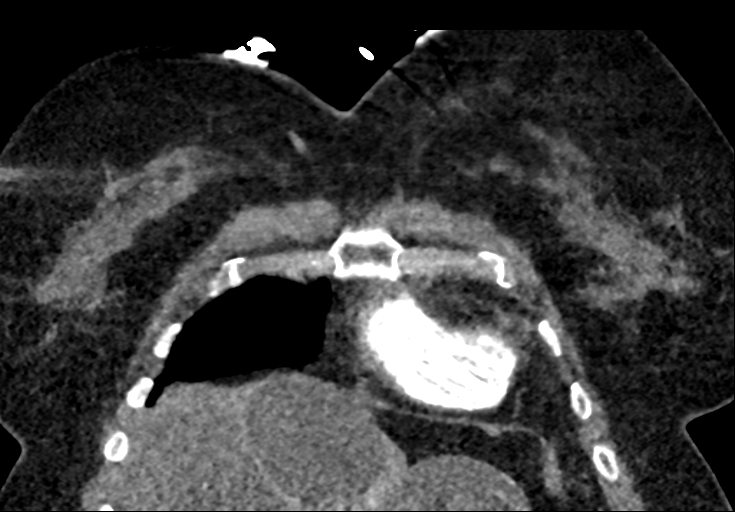
[im 72/143  soft-tissue]
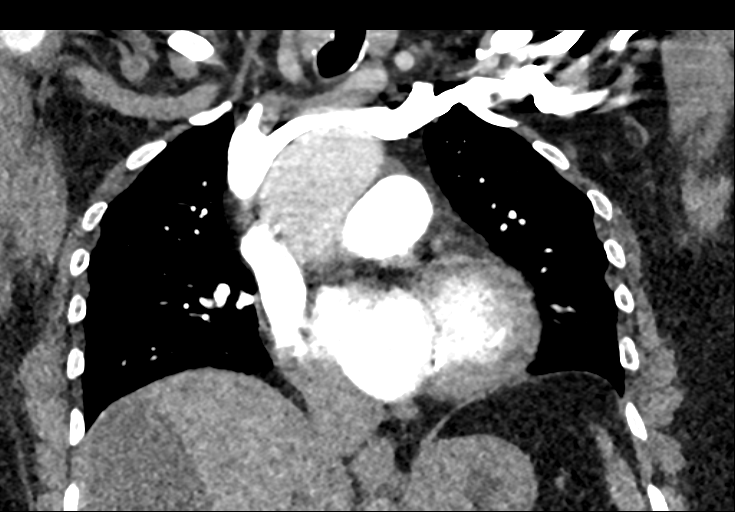
[im 107/143  soft-tissue]
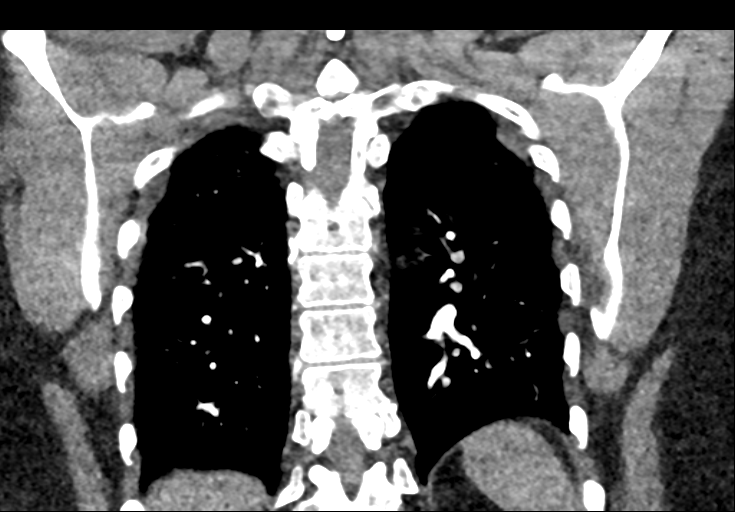

[18 of 46 positions shown; findings below may reference images not displayed]

FINDINGS: Cardiovascular: Heart is enlarged. No pericardial effusion. No
thoracic aortic aneurysm. No filling defect in the opacified
pulmonary arteries to suggest the presence of an acute pulmonary
embolus.

Mediastinum/Nodes: No mediastinal lymphadenopathy. There is no hilar
lymphadenopathy. The esophagus has normal imaging features. There is
no axillary lymphadenopathy.

Lungs/Pleura: Low lung volumes with probable compressive atelectasis
dependently. Areas of mosaic attenuation in both lungs may be
related to small airway disease. No pulmonary edema. No evidence of
pleural effusion.

Upper Abdomen: As seen on prior study, a large hepatic cysts are
evident. Incompletely visualize 9 cm cyst is noted towards the
hepatic dome.

Musculoskeletal: Bone windows reveal no worrisome lytic or sclerotic
osseous lesions.

Review of the MIP images confirms the above findings.
IMPRESSION: 1. No evidence for acute pulmonary embolus.
2. Scattered areas of mosaic attenuation in the lungs may reflect
small airways disease.
3. Multiple large hepatic cysts, similar to prior study.

## 2017-08-28 DIAGNOSIS — Z79899 Other long term (current) drug therapy: Secondary | ICD-10-CM | POA: Diagnosis not present

## 2017-08-28 DIAGNOSIS — Z7984 Long term (current) use of oral hypoglycemic drugs: Secondary | ICD-10-CM | POA: Diagnosis not present

## 2017-08-28 DIAGNOSIS — T7840XA Allergy, unspecified, initial encounter: Secondary | ICD-10-CM | POA: Diagnosis not present

## 2017-08-28 DIAGNOSIS — I2510 Atherosclerotic heart disease of native coronary artery without angina pectoris: Secondary | ICD-10-CM | POA: Insufficient documentation

## 2017-08-28 DIAGNOSIS — S6991XA Unspecified injury of right wrist, hand and finger(s), initial encounter: Secondary | ICD-10-CM | POA: Diagnosis present

## 2017-08-28 DIAGNOSIS — Y999 Unspecified external cause status: Secondary | ICD-10-CM | POA: Diagnosis not present

## 2017-08-28 DIAGNOSIS — Y9389 Activity, other specified: Secondary | ICD-10-CM | POA: Diagnosis not present

## 2017-08-28 DIAGNOSIS — Y929 Unspecified place or not applicable: Secondary | ICD-10-CM | POA: Diagnosis not present

## 2017-08-28 DIAGNOSIS — W268XXA Contact with other sharp object(s), not elsewhere classified, initial encounter: Secondary | ICD-10-CM | POA: Diagnosis not present

## 2017-08-28 DIAGNOSIS — E119 Type 2 diabetes mellitus without complications: Secondary | ICD-10-CM | POA: Insufficient documentation

## 2017-08-28 DIAGNOSIS — I110 Hypertensive heart disease with heart failure: Secondary | ICD-10-CM | POA: Insufficient documentation

## 2017-08-28 DIAGNOSIS — S61212A Laceration without foreign body of right middle finger without damage to nail, initial encounter: Secondary | ICD-10-CM | POA: Insufficient documentation

## 2017-08-28 DIAGNOSIS — I5030 Unspecified diastolic (congestive) heart failure: Secondary | ICD-10-CM | POA: Insufficient documentation

## 2017-08-28 DIAGNOSIS — Z23 Encounter for immunization: Secondary | ICD-10-CM | POA: Insufficient documentation

## 2017-08-29 ENCOUNTER — Other Ambulatory Visit: Payer: Self-pay

## 2017-08-29 ENCOUNTER — Emergency Department (HOSPITAL_COMMUNITY)
Admission: EM | Admit: 2017-08-29 | Discharge: 2017-08-29 | Disposition: A | Discharge status: Home / Self Care | Payer: Medicare Other | Attending: Emergency Medicine | Admitting: Emergency Medicine

## 2017-08-29 ENCOUNTER — Encounter (HOSPITAL_COMMUNITY): Payer: Self-pay

## 2017-08-29 DIAGNOSIS — S61212A Laceration without foreign body of right middle finger without damage to nail, initial encounter: Secondary | ICD-10-CM | POA: Diagnosis not present

## 2017-08-29 DIAGNOSIS — T7840XA Allergy, unspecified, initial encounter: Secondary | ICD-10-CM

## 2017-08-29 MED ORDER — DIPHENHYDRAMINE HCL 25 MG PO TABS: 25.0000 mg | ORAL_TABLET | Freq: Four times a day (QID) | ORAL | 0 refills | Status: DC | PRN

## 2017-08-29 MED ORDER — DIPHENHYDRAMINE HCL 50 MG/ML IJ SOLN
INTRAMUSCULAR | Status: AC
  Filled 2017-08-29: qty 1

## 2017-08-29 MED ORDER — HYDROXYZINE HCL 25 MG PO TABS
25.0000 mg | ORAL_TABLET | Freq: Once | ORAL | Status: AC
  Administered 2017-08-29: 25 mg via ORAL
  Filled 2017-08-29: qty 1

## 2017-08-29 MED ORDER — DIPHENHYDRAMINE HCL 50 MG/ML IJ SOLN
50.0000 mg | Freq: Once | INTRAMUSCULAR | Status: AC
  Administered 2017-08-29: 50 mg via INTRAVENOUS

## 2017-08-29 MED ORDER — LIDOCAINE HCL 2 % IJ SOLN
10.0000 mL | Freq: Once | INTRAMUSCULAR | Status: AC
  Administered 2017-08-29: 200 mg
  Filled 2017-08-29: qty 20

## 2017-08-29 MED ORDER — PREDNISONE 20 MG PO TABS: 40.0000 mg | ORAL_TABLET | Freq: Every day | ORAL | 0 refills | Status: DC

## 2017-08-29 MED ORDER — TETANUS-DIPHTH-ACELL PERTUSSIS 5-2.5-18.5 LF-MCG/0.5 IM SUSP
0.5000 mL | Freq: Once | INTRAMUSCULAR | Status: AC
  Administered 2017-08-29: 0.5 mL via INTRAMUSCULAR
  Filled 2017-08-29: qty 0.5

## 2017-08-29 MED ORDER — METHYLPREDNISOLONE SODIUM SUCC 125 MG IJ SOLR
125.0000 mg | Freq: Once | INTRAMUSCULAR | Status: AC
  Administered 2017-08-29: 125 mg via INTRAVENOUS
  Filled 2017-08-29: qty 2

## 2017-08-29 MED ORDER — FAMOTIDINE IN NACL 20-0.9 MG/50ML-% IV SOLN
20.0000 mg | Freq: Once | INTRAVENOUS | Status: AC
  Administered 2017-08-29: 20 mg via INTRAVENOUS
  Filled 2017-08-29: qty 50

## 2017-08-29 NOTE — ED Provider Notes (Addendum)
[facility] DEPT Provider Note   CSN: [id number] Arrival date & time: 08/28/17  2112     History   Chief Complaint Chief Complaint  Patient presents with  . finger laceration    HPI Brenda Flores is a 72 y.o. female.  HPI  This is a 72 year old female with a history of coronary artery disease, hypertension, hyperlipidemia who presents with a laceration to the right middle digit.  Patient reports that she was opening a can with a can opener when they can cut her right middle digit.  She denies any significant pain.  She has had some bleeding.  Tetanus shot was over 10 years ago.  She is right-handed.  Denies any other injury.  Past Medical History:  Diagnosis Date  . Achilles rupture, right   . Bronchitis   . CAD (coronary artery disease) Dr Burt Knack   non-obs CAD by cath in 7/07 (mLAD 30-40%, EF 65%)  . DDD (degenerative disc disease), lumbar   . DJD (degenerative joint disease)    KNEES  . GERD (gastroesophageal reflux disease)   . History of mitral valve prolapse    PER PT REPORTED MVP  PER DR HARSHAW FROM CARDIAC CATH IN 1987 (APPROX)  . 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 Hyperlipidemia   . Hypertension   . Left nephrolithiasis    NON-OBSTRUCTIVE  . Liver, polycystic   . Lumbar radiculopathy   . Osteoporosis   . PONV (postoperative nausea and vomiting)   . Right ureteral stone   . Type 2 diabetes mellitus [facility])     Patient Active Problem List   Diagnosis Date Noted  . Medicare annual wellness visit, subsequent 11/28/2016  . Diabetes type 2, controlled ([place])   . HLD (hyperlipidemia)   . History of mitral valve prolapse 04/05/2012  . Liver cyst   . Diastolic heart failure, NYHA class 1 ([place]) 04/08/2011  . Lumbar radiculopathy 04/07/2011  . CAD in native artery 04/07/2011  . Lumbar disc disease 04/07/2011  . Osteoarthritis 04/07/2011  . Hypertension 04/06/2011     Past Surgical History:  Procedure Laterality Date  . ACHILLES TENDON SURGERY Right 05/26/2016   Procedure: RIGHT ACHILLES TENDON REPAIR,CALCANEUS SPUR EXCISION;  Surgeon: Ninetta Lights, MD;  Location: [place];  Service: Orthopedics;  Laterality: Right;  . CARDIAC CATHETERIZATION  10-17-2005   DR COOPER   NON-OBSTRUCTIVE CAD/   mLAD 30-40%/   NORMAL LVF/  EF 65%  . CARDIOVASCULAR STRESS TEST  04-12-2011  DR Carleene Overlie TAYLOR   LOW RISK STUDY/  NO ISCHEMIA/  EF 68%  . CATARACT EXTRACTION W/ INTRAOCULAR LENS IMPLANT Right 2003  . COLONOSCOPY W/ POLYPECTOMY  03-17-2011  . CYSTOSCOPY WITH URETEROSCOPY Right 06/14/2013   Procedure: RIGHT  URETEROSCOPY,ureteral and urethral dilitation;  Surgeon: Claybon Jabs, MD;  Location: [facility];  Service: Urology;  Laterality: Right;  . HEEL SPUR RESECTION Right 05/26/2016   Procedure: HEEL SPUR EXCISION;  Surgeon: Ninetta Lights, MD;  Location: [place];  Service: Orthopedics;  Laterality: Right;  . HEEL SPUR SURGERY Left 2003  . TOTAL ABDOMINAL HYSTERECTOMY W/ BILATERAL SALPINGOOPHORECTOMY  1978  . TRANSTHORACIC ECHOCARDIOGRAM  04-07-2011   MILD LVH/  MILD FOCAL BASAL SEPTAL HYPERTROPHY/  EF  55-60% /  GRADE I DIASTOLIC DYSFUNCTION     OB History   None      Home Medications    Prior  to Admission medications   Medication Sig Start Date End Date Taking? Authorizing Provider  albuterol (PROVENTIL HFA;VENTOLIN HFA) 108 (90 Base) MCG/ACT inhaler Inhale 1-2 puffs into the lungs every 6 (six) hours as needed for wheezing or shortness of breath. 04/13/17   Jearld Fenton, NP  amLODipine (NORVASC) 10 MG tablet Take 1 tablet by mouth once daily for high blood pressure. 05/29/17   Pleas Koch, NP  atorvastatin (LIPITOR) 20 MG tablet Take 1 tablet by mouth every evening for cholesterol. 05/29/17   Pleas Koch, NP  carvedilol (COREG) 6.25 MG tablet TAKE 1 TABLET (6.25 MG TOTAL) BY MOUTH 2 (TWO)  TIMES DAILY WITH A MEAL. 12/22/16   Pleas Koch, NP  Continuous Blood Gluc Receiver (FREESTYLE LIBRE 14 DAY READER) DEVI 1 Device by Does not apply route every 14 (fourteen) days. 05/29/17   Pleas Koch, NP  Continuous Blood Gluc Sensor (FREESTYLE LIBRE 14 DAY SENSOR) MISC 1 Device by Does not apply route every 14 (fourteen) days. 05/29/17   Pleas Koch, NP  diphenhydrAMINE (BENADRYL) 25 MG tablet Take 1 tablet (25 mg total) by mouth every 6 (six) hours as needed. 08/29/17   Edd Reppert, Barbette Hair, MD  lisinopril (PRINIVIL,ZESTRIL) 5 MG tablet Take 1 tablet (5 mg total) by mouth daily. For kidney protection. 05/29/17   Pleas Koch, NP  meloxicam (MOBIC) 15 MG tablet Take 1 tablet by mouth once daily as needed for pain. 05/29/17   Pleas Koch, NP  predniSONE (DELTASONE) 20 MG tablet Take 2 tablets (40 mg total) by mouth daily. 08/29/17   Amberrose Friebel, Barbette Hair, MD  sitaGLIPtin (JANUVIA) 100 MG tablet Take 1 tablet by mouth once daily for diabetes. 05/29/17   Pleas Koch, NP  traMADol (ULTRAM) 50 MG tablet Take 50 mg by mouth every 8 (eight) hours as needed for pain. 09/20/16   [provider]    Family History Family History  Problem Relation Age of Onset  . Cancer Mother        breast  . Cancer Father        colon  . Cancer Brother        ?lung  . Breast cancer Neg Hx     Social History Social History   Tobacco Use  . Smoking status: Never Smoker  . Smokeless tobacco: Never Used  Substance Use Topics  . Alcohol use: No    Alcohol/week: 0.0 oz  . Drug use: No     Allergies   Darvocet [propoxyphene n-acetaminophen]   Review of Systems Review of Systems  Skin: Positive for wound.  Neurological: Negative for weakness and numbness.  All other systems reviewed and are negative.    Physical Exam Updated Vital Signs BP (!) 150/94   Pulse 76   Temp 98.9 F (37.2 C) (Oral)   Resp 16   Ht 5' 5.5" (1.664 m)   Wt 84.4 kg (186 lb)   SpO2 99%    BMI 30.48 kg/m   Physical Exam  Constitutional: She is oriented to person, place, and time. She appears well-developed and well-nourished.  HENT:  Head: Normocephalic and atraumatic.  Neck: Neck supple.  Cardiovascular: Normal rate and regular rhythm.  Pulmonary/Chest: Effort normal. No respiratory distress.  Musculoskeletal:  Focused examination of the right hand with normal range of motion at the PIP and DIP joints of all fingers  Neurological: She is alert and oriented to person, place, and time.  Skin: Skin is warm and  dry.  3 cm slightly gaping laceration between the PIP and DIP joint of the right middle digit, bleeding controlled, neurovascularly intact distally with good sensation  Psychiatric: She has a normal mood and affect.  Nursing note and vitals reviewed.    ED Treatments / Results  Labs (all labs ordered are listed, but only abnormal results are displayed) Labs Reviewed - No data to display  EKG None  Radiology No results found.  Procedures .Marland KitchenLaceration Repair Date/Time: 08/29/2017 2:46 AM Performed by: Merryl Hacker, MD Authorized by: Merryl Hacker, MD   Consent:    Consent obtained:  Verbal   Consent given by:  Patient   Risks discussed:  Infection and pain   Alternatives discussed:  No treatment Anesthesia (see MAR for exact dosages):    Anesthesia method:  Local infiltration   Local anesthetic:  Lidocaine 2% w/o epi Laceration details:    Location:  Finger   Finger location:  R long finger   Length (cm):  3   Depth (mm):  2 Repair type:    Repair type:  Simple Pre-procedure details:    Preparation:  Patient was prepped and draped in usual sterile fashion Exploration:    Wound extent: no foreign bodies/material noted, no tendon damage noted, no underlying fracture noted and no vascular damage noted     Contaminated: no   Treatment:    Area cleansed with:  Betadine   Amount of cleaning:  Standard   Irrigation solution:  Sterile  saline   Irrigation volume:  250   Irrigation method:  Pressure wash   Visualized foreign bodies/material removed: no   Skin repair:    Repair method:  Sutures   Suture size:  4-0   Suture material:  Nylon   Suture technique:  Simple interrupted   Number of sutures:  3 Approximation:    Approximation:  Close Post-procedure details:    Dressing:  Antibiotic ointment and non-adherent dressing   Patient tolerance of procedure:  Tolerated well, no immediate complications   (including critical care time)  CRITICAL CARE Performed by: Merryl Hacker   Total critical care time: 35 minutes  Critical care time was exclusive of separately billable procedures and treating other patients.  Critical care was necessary to treat or prevent imminent or life-threatening deterioration.  Critical care was time spent personally by me on the following activities: development of treatment plan with patient and/or surrogate as well as nursing, discussions with consultants, evaluation of patient's response to treatment, examination of patient, obtaining history from patient or surrogate, ordering and performing treatments and interventions, ordering and review of laboratory studies, ordering and review of radiographic studies, pulse oximetry and re-evaluation of patient's condition.  Medications Ordered in ED Medications  Tdap (BOOSTRIX) injection 0.5 mL (0.5 mLs Intramuscular Given 08/29/17 0242)  lidocaine (XYLOCAINE) 2 % (with pres) injection 200 mg (200 mg Infiltration Given by Other 08/29/17 0231)  hydrOXYzine (ATARAX/VISTARIL) tablet 25 mg (25 mg Oral Given 08/29/17 0241)  diphenhydrAMINE (BENADRYL) injection 50 mg (50 mg Intravenous Given 08/29/17 0315)  famotidine (PEPCID) IVPB 20 mg premix (0 mg Intravenous Stopped 08/29/17 0414)  methylPREDNISolone sodium succinate (SOLU-MEDROL) 125 mg/2 mL injection 125 mg (125 mg Intravenous Given 08/29/17 0319)     Initial Impression / Assessment and Plan  / ED Course  I have reviewed the triage vital signs and the nursing notes.  Pertinent labs & imaging results that were available during my care of the patient were reviewed by me and  considered in my medical decision making (see chart for details).     Patient presents with a laceration to the right middle digit.  No evidence of tendon involvement.  Appears simple.  Tetanus was updated.  Laceration was prepared at the bedside without difficulty.  Patient instructed to have sutures removed in the next 7 to 10 days.  She was given precautions regarding infection.  After history, exam, and medical workup I feel the patient has been appropriately medically screened and is safe for discharge home. Pertinent diagnoses were discussed with the patient. Patient was given return precautions.  After discharge, patient developed diffuse hives and itching immediately after administration of tetanus shot.  No known tetanus allergy.  Patient was given Benadryl, Pepcid, and Solu-Medrol.  She had no signs or symptoms of anaphylaxis.  She was very sleepy after administration of medications.  She was observed for several hours without recurrence of symptoms and her rash defervesced.  Suspect this is likely related to tetanus shot.  Will discharge with a short course of prednisone.  I have advised the patient not to take tetanus again.   Final Clinical Impressions(s) / ED Diagnoses   Final diagnoses:  Laceration of right middle finger without foreign body without damage to nail, initial encounter  Allergic reaction, initial encounter    ED Discharge Orders        Ordered    predniSONE (DELTASONE) 20 MG tablet  Daily     08/29/17 0602    diphenhydrAMINE (BENADRYL) 25 MG tablet  Every 6 hours PRN     08/29/17 0602       Coreena Rubalcava, Barbette Hair, MD 08/29/17 7782    Merryl Hacker, MD 08/29/17 [phone]

## 2017-08-29 NOTE — ED Notes (Addendum)
Pt called out stating that she was covered in red hives and that she can " feel them coming up" after receiving her TDAP injection. EDPA was notified to examine pt.

## 2017-08-29 NOTE — ED Triage Notes (Signed)
Pt opened a can of beans and cut her finger along the top with the can top

## 2017-08-29 NOTE — Discharge Instructions (Addendum)
You were seen today for a laceration of her right middle digit.  Stitches were placed.  You will need to have these removed in approximately 7 days.  Monitor for signs and symptoms of infection.  Do not submerge her hands in water.  It appears you had an allergic reaction to your tetanus shot.  Take prednisone daily for the next 4 days.  Benadryl as needed.  You should not get a tetanus shot in the future.

## 2017-09-05 ENCOUNTER — Other Ambulatory Visit: Payer: Self-pay | Admitting: Primary Care

## 2017-09-05 DIAGNOSIS — I10 Essential (primary) hypertension: Secondary | ICD-10-CM

## 2017-09-05 DIAGNOSIS — M1712 Unilateral primary osteoarthritis, left knee: Secondary | ICD-10-CM

## 2017-09-05 NOTE — Telephone Encounter (Signed)
Noted, refills sent to pharmacy. 

## 2017-09-05 NOTE — Telephone Encounter (Signed)
Ok to refill? Electronically refill request for   meloxicam (MOBIC) 15 MG tablet Last prescribed on 05/29/2017  carvedilol (COREG) 6.25 MG tablet Last prescribed on 12/22/2016  Last seen on 05/29/2017  Comment from pharmacy:  [facility].

## 2017-09-08 ENCOUNTER — Ambulatory Visit: Payer: Medicare Other | Admitting: Internal Medicine

## 2017-09-11 ENCOUNTER — Ambulatory Visit (INDEPENDENT_AMBULATORY_CARE_PROVIDER_SITE_OTHER): Payer: Medicare Other | Admitting: Internal Medicine

## 2017-09-11 ENCOUNTER — Encounter: Payer: Self-pay | Admitting: Internal Medicine

## 2017-09-11 VITALS — BP 128/84 | HR 66 | Temp 97.9°F | Wt 200.0 lb

## 2017-09-11 DIAGNOSIS — Z4802 Encounter for removal of sutures: Secondary | ICD-10-CM

## 2017-09-11 DIAGNOSIS — L509 Urticaria, unspecified: Secondary | ICD-10-CM

## 2017-09-11 DIAGNOSIS — S61212D Laceration without foreign body of right middle finger without damage to nail, subsequent encounter: Secondary | ICD-10-CM | POA: Diagnosis not present

## 2017-09-11 NOTE — Patient Instructions (Signed)
Suture Removal, Care After Refer to this sheet in the next few weeks. These instructions provide you with information on caring for yourself after your procedure. Your health care provider may also give you more specific instructions. Your treatment has been planned according to current medical practices, but problems sometimes occur. Call your health care provider if you have any problems or questions after your procedure. What can I expect after the procedure? After your stitches (sutures) are removed, it is typical to have the following:  Some discomfort and swelling in the wound area.  Slight redness in the area.  Follow these instructions at home:  If you have skin adhesive strips over the wound area, do not take the strips off. They will fall off on their own in a few days. If the strips remain in place after 14 days, you may remove them.  Change any bandages (dressings) at least once a day or as directed by your health care provider. If the bandage sticks, soak it off with warm, soapy water.  Apply cream or ointment only as directed by your health care provider. If using cream or ointment, wash the area with soap and water 2 times a day to remove all the cream or ointment. Rinse off the soap and pat the area dry with a clean towel.  Keep the wound area dry and clean. If the bandage becomes wet or dirty, or if it develops a bad smell, change it as soon as possible.  Continue to protect the wound from injury.  Use sunscreen when out in the sun. New scars become sunburned easily. Contact a health care provider if:  You have increasing redness, swelling, or pain in the wound.  You see pus coming from the wound.  You have a fever.  You notice a bad smell coming from the wound or dressing.  Your wound breaks open (edges not staying together). This information is not intended to replace advice given to you by your health care provider. Make sure you discuss any questions you have  with your health care provider. Document Released: 11/30/2000 Document Revised: 08/13/2015 Document Reviewed: 10/17/2012 Elsevier Interactive Patient Education  2017 Elsevier Inc.  

## 2017-09-11 NOTE — Progress Notes (Signed)
Subjective:    Patient ID: Brenda Flores, female    DOB: 1945-07-17, 72 y.o.   MRN: [id number]  HPI  Pt presents to the clinic today for ER follow up and stitch removal. She went to the ER 6/11 after cutting her right middle finger on a can she was trying to open. She received a tetanus booster. After the tetanus booster, she develop hives which was treated with Benadryl, Famotidine and Solu-Medrol. She was advised not to take the tetanus booster again. Her right middle finger was sutured with 3 simple interrupted sutures. She was discharged with RX for Prednisone and Benadryl. Since discharge, she reports she has been doing well. She denies any recurrent hives. She denies warmth, redness or discharge from her laceration.  Review of Systems      Past Medical History:  Diagnosis Date  . Achilles rupture, right   . Bronchitis   . CAD (coronary artery disease) Dr Burt Knack   non-obs CAD by cath in 7/07 (mLAD 30-40%, EF 65%)  . DDD (degenerative disc disease), lumbar   . DJD (degenerative joint disease)    KNEES  . GERD (gastroesophageal reflux disease)   . History of mitral valve prolapse    PER PT REPORTED MVP  PER DR HARSHAW FROM CARDIAC CATH IN 1987 (APPROX)  . 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 Hyperlipidemia   . Hypertension   . Left nephrolithiasis    NON-OBSTRUCTIVE  . Liver, polycystic   . Lumbar radiculopathy   . Osteoporosis   . PONV (postoperative nausea and vomiting)   . Right ureteral stone   . Type 2 diabetes mellitus ([place])     Current Outpatient Medications  Medication Sig Dispense Refill  . albuterol (PROVENTIL HFA;VENTOLIN HFA) 108 (90 Base) MCG/ACT inhaler Inhale 1-2 puffs into the lungs every 6 (six) hours as needed for wheezing or shortness of breath. 1 Inhaler 0  . amLODipine (NORVASC) 10 MG tablet Take 1 tablet by mouth once daily for high blood pressure. 90 tablet 3  . atorvastatin (LIPITOR)  20 MG tablet Take 1 tablet by mouth every evening for cholesterol. 90 tablet 3  . carvedilol (COREG) 6.25 MG tablet TAKE ONE TABLET TWICE A DAY WITH FOOD 180 tablet 2  . Continuous Blood Gluc Receiver (FREESTYLE LIBRE 14 DAY READER) DEVI 1 Device by Does not apply route every 14 (fourteen) days. 1 Device 0  . Continuous Blood Gluc Sensor (FREESTYLE LIBRE 14 DAY SENSOR) MISC 1 Device by Does not apply route every 14 (fourteen) days. 2 each 0  . diphenhydrAMINE (BENADRYL) 25 MG tablet Take 1 tablet (25 mg total) by mouth every 6 (six) hours as needed. 20 tablet 0  . lisinopril (PRINIVIL,ZESTRIL) 5 MG tablet Take 1 tablet (5 mg total) by mouth daily. For kidney protection. 90 tablet 3  . meloxicam (MOBIC) 15 MG tablet TAKE ONE TABLET BY MOUTH EVERY DAY AS NEEDED FOR PAIN 90 tablet 0  . predniSONE (DELTASONE) 20 MG tablet Take 2 tablets (40 mg total) by mouth daily. 8 tablet 0  . sitaGLIPtin (JANUVIA) 100 MG tablet Take 1 tablet by mouth once daily for diabetes. 90 tablet 3  . traMADol (ULTRAM) 50 MG tablet Take 50 mg by mouth every 8 (eight) hours as needed for pain.  0   No current facility-administered medications for this visit.     Allergies  Allergen Reactions  . Darvocet [Propoxyphene N-Acetaminophen] Nausea And Vomiting  Family History  Problem Relation Age of Onset  . Cancer Mother        breast  . Cancer Father        colon  . Cancer Brother        ?lung  . Breast cancer Neg Hx     Social History   Socioeconomic History  . Marital status: Widowed    Spouse name: Not on file  . Number of children: Not on file  . Years of education: Not on file  . Highest education level: Not on file  Occupational History  . Not on file  Social Needs  . Financial resource strain: Not on file  . Food insecurity:    Worry: Not on file    Inability: Not on file  . Transportation needs:    Medical: Not on file    Non-medical: Not on file  Tobacco Use  . Smoking status: Never Smoker    . Smokeless tobacco: Never Used  Substance and Sexual Activity  . Alcohol use: No    Alcohol/week: 0.0 oz  . Drug use: No  . Sexual activity: Not on file  Lifestyle  . Physical activity:    Days per week: Not on file    Minutes per session: Not on file  . Stress: Not on file  Relationships  . Social connections:    Talks on phone: Not on file    Gets together: Not on file    Attends religious service: Not on file    Active member of club or organization: Not on file    Attends meetings of clubs or organizations: Not on file    Relationship status: Not on file  . Intimate partner violence:    Fear of current or ex partner: Not on file    Emotionally abused: Not on file    Physically abused: Not on file    Forced sexual activity: Not on file  Other Topics Concern  . Not on file  Social History Narrative  . Not on file     Constitutional: Denies fever, malaise, fatigue, headache or abrupt weight changes.  Musculoskeletal: Denies decrease in range of motion, difficulty with gait, muscle pain or joint pain and swelling.  Skin: Pt reports sutures to right middle finger. Denies redness, rashes, lesions or ulcercations.  Neurological: Denies dizziness, difficulty with memory, difficulty with speech or problems with balance and coordination.    No other specific complaints in a complete review of systems (except as listed in HPI above).  Objective:   Physical Exam  BP 128/84   Pulse 66   Temp 97.9 F (36.6 C) (Oral)   Wt 200 lb (90.7 kg)   SpO2 97%   BMI 32.78 kg/m  Wt Readings from Last 3 Encounters:  09/11/17 200 lb (90.7 kg)  08/29/17 186 lb (84.4 kg)  05/29/17 189 lb 12.8 oz (86.1 kg)    General: Appears her stated age, well developed, well nourished in NAD. Skin: 3 sutures noted right posterior finger between the PIP and DIP. Cardiovascular: Right radial pulse 2+. Cap refill < 3 secs. Musculoskeletal: Normal flexion and extension of the right middle finger. No  joint swelling noted. Hand grips equal.   Neurological: Alert and oriented. Sensation intact to right hand.  BMET    Component Value Date/Time   NA 142 11/28/2016 1541   K 4.0 11/28/2016 1541   CL 104 11/28/2016 1541   CO2 32 11/28/2016 1541   GLUCOSE 75 11/28/2016 1541  BUN 20 11/28/2016 1541   CREATININE 0.85 11/28/2016 1541   CALCIUM 9.4 11/28/2016 1541   GFRNONAA >60 09/23/2016 1155   GFRAA >60 09/23/2016 1155    Lipid Panel     Component Value Date/Time   CHOL 242 (H) 11/28/2016 1541   TRIG 166.0 (H) 11/28/2016 1541   HDL 42.10 11/28/2016 1541   CHOLHDL 6 11/28/2016 1541   VLDL 33.2 11/28/2016 1541   LDLCALC 167 (H) 11/28/2016 1541    CBC    Component Value Date/Time   WBC 5.9 09/23/2016 1155   RBC 4.50 09/23/2016 1155   HGB 13.0 09/23/2016 1155   HCT 39.9 09/23/2016 1155   PLT 214 09/23/2016 1155   MCV 88.7 09/23/2016 1155   MCH 28.9 09/23/2016 1155   MCHC 32.6 09/23/2016 1155   RDW 14.6 09/23/2016 1155   LYMPHSABS 2.3 07/11/2015 1112   MONOABS 0.3 07/11/2015 1112   EOSABS 0.2 07/11/2015 1112   BASOSABS 0.0 07/11/2015 1112    Hgb A1C Lab Results  Component Value Date   HGBA1C 6.2 05/29/2017            Assessment & Plan:   ER Follow Up for Laceration of Right Middle Finger, Urticaria:  ER notes, labs and imaging reviewed Sutures removed by provider Aftercare instructions given  Return precautions discussed Webb Silversmith, NP

## 2017-10-03 ENCOUNTER — Other Ambulatory Visit: Payer: Self-pay | Admitting: Primary Care

## 2017-10-16 ENCOUNTER — Emergency Department (HOSPITAL_COMMUNITY)
Admission: EM | Admit: 2017-10-16 | Discharge: 2017-10-16 | Disposition: A | Discharge status: Home / Self Care | Payer: Medicare Other | Attending: Emergency Medicine | Admitting: Emergency Medicine

## 2017-10-16 ENCOUNTER — Emergency Department (HOSPITAL_COMMUNITY): Payer: Medicare Other

## 2017-10-16 ENCOUNTER — Encounter (HOSPITAL_COMMUNITY): Payer: Self-pay | Admitting: Emergency Medicine

## 2017-10-16 ENCOUNTER — Ambulatory Visit (HOSPITAL_COMMUNITY)
Admission: EM | Admit: 2017-10-16 | Discharge: 2017-10-16 | Disposition: A | Discharge status: Home / Self Care | Payer: Medicare Other | Attending: Family Medicine | Admitting: Family Medicine

## 2017-10-16 ENCOUNTER — Other Ambulatory Visit: Payer: Self-pay

## 2017-10-16 DIAGNOSIS — M791 Myalgia, unspecified site: Secondary | ICD-10-CM | POA: Diagnosis not present

## 2017-10-16 DIAGNOSIS — M542 Cervicalgia: Secondary | ICD-10-CM | POA: Diagnosis not present

## 2017-10-16 DIAGNOSIS — S161XXA Strain of muscle, fascia and tendon at neck level, initial encounter: Secondary | ICD-10-CM | POA: Diagnosis not present

## 2017-10-16 DIAGNOSIS — Z7984 Long term (current) use of oral hypoglycemic drugs: Secondary | ICD-10-CM | POA: Insufficient documentation

## 2017-10-16 DIAGNOSIS — G44309 Post-traumatic headache, unspecified, not intractable: Secondary | ICD-10-CM

## 2017-10-16 DIAGNOSIS — Y929 Unspecified place or not applicable: Secondary | ICD-10-CM | POA: Diagnosis not present

## 2017-10-16 DIAGNOSIS — M546 Pain in thoracic spine: Secondary | ICD-10-CM | POA: Diagnosis not present

## 2017-10-16 DIAGNOSIS — S39012A Strain of muscle, fascia and tendon of lower back, initial encounter: Secondary | ICD-10-CM

## 2017-10-16 DIAGNOSIS — I110 Hypertensive heart disease with heart failure: Secondary | ICD-10-CM | POA: Insufficient documentation

## 2017-10-16 DIAGNOSIS — M25512 Pain in left shoulder: Secondary | ICD-10-CM | POA: Diagnosis not present

## 2017-10-16 DIAGNOSIS — S299XXA Unspecified injury of thorax, initial encounter: Secondary | ICD-10-CM | POA: Diagnosis not present

## 2017-10-16 DIAGNOSIS — Z79899 Other long term (current) drug therapy: Secondary | ICD-10-CM | POA: Insufficient documentation

## 2017-10-16 DIAGNOSIS — S0990XA Unspecified injury of head, initial encounter: Secondary | ICD-10-CM | POA: Diagnosis not present

## 2017-10-16 DIAGNOSIS — S4992XA Unspecified injury of left shoulder and upper arm, initial encounter: Secondary | ICD-10-CM | POA: Diagnosis not present

## 2017-10-16 DIAGNOSIS — E119 Type 2 diabetes mellitus without complications: Secondary | ICD-10-CM | POA: Insufficient documentation

## 2017-10-16 DIAGNOSIS — I5030 Unspecified diastolic (congestive) heart failure: Secondary | ICD-10-CM | POA: Insufficient documentation

## 2017-10-16 DIAGNOSIS — R0781 Pleurodynia: Secondary | ICD-10-CM | POA: Diagnosis not present

## 2017-10-16 DIAGNOSIS — G44319 Acute post-traumatic headache, not intractable: Secondary | ICD-10-CM

## 2017-10-16 DIAGNOSIS — I2510 Atherosclerotic heart disease of native coronary artery without angina pectoris: Secondary | ICD-10-CM | POA: Diagnosis not present

## 2017-10-16 DIAGNOSIS — M79622 Pain in left upper arm: Secondary | ICD-10-CM | POA: Diagnosis not present

## 2017-10-16 DIAGNOSIS — M545 Low back pain: Secondary | ICD-10-CM | POA: Diagnosis not present

## 2017-10-16 DIAGNOSIS — S199XXA Unspecified injury of neck, initial encounter: Secondary | ICD-10-CM | POA: Diagnosis not present

## 2017-10-16 DIAGNOSIS — S3992XA Unspecified injury of lower back, initial encounter: Secondary | ICD-10-CM | POA: Diagnosis not present

## 2017-10-16 DIAGNOSIS — Y999 Unspecified external cause status: Secondary | ICD-10-CM | POA: Diagnosis not present

## 2017-10-16 DIAGNOSIS — Y939 Activity, unspecified: Secondary | ICD-10-CM | POA: Insufficient documentation

## 2017-10-16 DIAGNOSIS — M79602 Pain in left arm: Secondary | ICD-10-CM | POA: Diagnosis not present

## 2017-10-16 DIAGNOSIS — R51 Headache: Secondary | ICD-10-CM | POA: Diagnosis not present

## 2017-10-16 MED ORDER — NAPROXEN 375 MG PO TABS: 375.0000 mg | ORAL_TABLET | Freq: Two times a day (BID) | ORAL | 0 refills | Status: DC

## 2017-10-16 MED ORDER — TIZANIDINE HCL 2 MG PO CAPS: 2.0000 mg | ORAL_CAPSULE | Freq: Two times a day (BID) | ORAL | 0 refills | Status: DC | PRN

## 2017-10-16 MED ORDER — TIZANIDINE HCL 2 MG PO CAPS
2.0000 mg | ORAL_CAPSULE | Freq: Two times a day (BID) | ORAL | 0 refills | Status: DC | PRN
Start: 2017-10-16 — End: 2017-10-16
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
## 2017-10-16 NOTE — ED Notes (Signed)
Patient transported to X-ray 

## 2017-10-16 NOTE — ED Notes (Signed)
ED Provider at bedside. 
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
## 2017-10-16 NOTE — Discharge Instructions (Signed)
Please go to ER for further evaluation of your worsening headache.
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
## 2017-10-16 NOTE — ED Notes (Signed)
Pt returned from xray
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
## 2017-10-16 NOTE — ED Notes (Signed)
Patient verbalizes understanding of discharge instructions. Opportunity for questioning and answers were provided. Armband removed by staff, pt discharged from ED in wheelchair.  

## 2017-10-27 ENCOUNTER — Other Ambulatory Visit: Payer: Self-pay | Admitting: Primary Care

## 2017-10-31 ENCOUNTER — Other Ambulatory Visit: Payer: Self-pay | Admitting: Primary Care

## 2017-11-16 ENCOUNTER — Encounter: Payer: Self-pay | Admitting: Primary Care

## 2017-11-30 ENCOUNTER — Ambulatory Visit: Payer: Medicare Other

## 2017-12-06 ENCOUNTER — Ambulatory Visit (INDEPENDENT_AMBULATORY_CARE_PROVIDER_SITE_OTHER): Payer: Medicare Other | Admitting: Family Medicine

## 2017-12-06 ENCOUNTER — Encounter: Payer: Self-pay | Admitting: Family Medicine

## 2017-12-06 VITALS — BP 146/84 | HR 63 | Temp 97.8°F | Ht 65.0 in | Wt 198.5 lb

## 2017-12-06 DIAGNOSIS — M542 Cervicalgia: Secondary | ICD-10-CM | POA: Diagnosis not present

## 2017-12-06 DIAGNOSIS — G8929 Other chronic pain: Secondary | ICD-10-CM | POA: Insufficient documentation

## 2017-12-06 MED ORDER — TIZANIDINE HCL 2 MG PO CAPS: 2.0000 mg | ORAL_CAPSULE | Freq: Three times a day (TID) | ORAL | 0 refills | Status: DC | PRN
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
## 2017-12-06 NOTE — Assessment & Plan Note (Signed)
L sided neck pain for 1 day  Known disc dz per rev of old CT scan No neuro changes on exam-but poss some pain is radicular  Suspect spasm based on exam  Recommend heat  nsaid prn otc with food zanaflex prn with caution of sedation Stretching/gentle Needs new pillow-cervical support/foam pref  Update if not starting to improve in a week or if worsening

## 2017-12-06 NOTE — Patient Instructions (Signed)
Ibuprofen as directed with food is ok for the short term for pain  You can also try the muscle relaxer (caution of sedation) Heat - 10 minutes at a time Gentle stretching  Get a memory foam cervical support pillow to put you in better alignment   Update if not starting to improve in a week or if worsening

## 2017-12-07 ENCOUNTER — Encounter: Payer: Medicare Other | Admitting: Primary Care

## 2017-12-07 ENCOUNTER — Ambulatory Visit: Payer: Medicare Other

## 2017-12-07 DIAGNOSIS — Z0289 Encounter for other administrative examinations: Secondary | ICD-10-CM

## 2017-12-18 ENCOUNTER — Telehealth: Payer: Self-pay | Admitting: Primary Care

## 2017-12-18 NOTE — Telephone Encounter (Signed)
Copied from [place] [id number]. Topic: Inquiry >> Dec 18, 2017  8:31 AM Oliver Pila B wrote: Reason for CRM:  [facility] called b/c the pt has a history of hospitalization after extraction; they called to inquire if pcp recommends any pre medication treatment to prevent pt from going to hospital

## 2017-12-18 NOTE — Telephone Encounter (Signed)
I don't see the need for prophylactic antibiotics for dental work, but the dentist may prescribe something if he/she feels the need.

## 2017-12-19 NOTE — Telephone Encounter (Signed)
Called [employer] and notified them of Tawni Millers comments.

## 2018-03-30 ENCOUNTER — Other Ambulatory Visit: Payer: Self-pay | Admitting: Primary Care

## 2018-03-30 DIAGNOSIS — E785 Hyperlipidemia, unspecified: Secondary | ICD-10-CM

## 2018-04-10 ENCOUNTER — Other Ambulatory Visit: Payer: Self-pay | Admitting: Primary Care

## 2018-04-10 DIAGNOSIS — I10 Essential (primary) hypertension: Secondary | ICD-10-CM

## 2018-05-29 ENCOUNTER — Encounter: Payer: Self-pay | Admitting: Family Medicine

## 2018-05-29 ENCOUNTER — Ambulatory Visit (INDEPENDENT_AMBULATORY_CARE_PROVIDER_SITE_OTHER): Payer: Medicare Other | Admitting: Family Medicine

## 2018-05-29 DIAGNOSIS — M25519 Pain in unspecified shoulder: Secondary | ICD-10-CM

## 2018-05-29 MED ORDER — SITAGLIPTIN PHOSPHATE 100 MG PO TABS: 100.0000 mg | ORAL_TABLET | Freq: Every day | ORAL | 0 refills | Status: DC

## 2018-05-29 MED ORDER — HYDROCODONE-ACETAMINOPHEN 5-325 MG PO TABS: 0.5000 | ORAL_TABLET | Freq: Four times a day (QID) | ORAL | 0 refills | Status: DC | PRN

## 2018-05-29 NOTE — Patient Instructions (Signed)
Use the pain medicine as needed.  Work on arm swings to increase your range of motion.  Use ice as needed.  If not better, then we may need to set you up with orthopedics.  Schedule a diabetes follow up with Carlis Abbott with labs ahead of time.  Take care.  Glad to see you.

## 2018-05-29 NOTE — Progress Notes (Signed)
She had been off lipitor for an extended period of time, so that couldn't have caused the recent aches.  D/w pt.    She is totally out of lipitor.   She is still on her BP meds.   She ran out of [place] last week.    She is out of tramadol and tizanidine.    R shoulder, neck pain.  Started 2 days ago.  Worse yesterday evening.  More down the R arm, to the R elbow but not to the hand.  Pain leaning torso forward.  Tried heat and ice, liniment.  No help with tylenol.  Normal sensation in the hands.  Normal grip B.  R handed.  No trauma.  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  No L sided sx.    Tramadol didn't help her pain.   Meds, vitals, and allergies reviewed.   ROS: Per HPI unless specifically indicated in ROS section   nad ncat Neck supple, no LA No midline neck pain. rrr ctab  Normal left shoulder range of motion.  R shoulder with  Pain with elevation, int/ext rotation.  Exam limited by pain.  Able to do arm swings w/o pain.  Distally NV intact with normal grip.

## 2018-05-30 DIAGNOSIS — M25519 Pain in unspecified shoulder: Secondary | ICD-10-CM

## 2018-05-30 HISTORY — DX: Pain in unspecified shoulder: M25.519

## 2018-05-30 NOTE — Assessment & Plan Note (Addendum)
She likely has rotator cuff symptoms.  It does not appear that she has adhesive capsulitis.  She doesn't have an arm drop.  She does have pain that limits ROM.  D/w pt about options.   Use the hydrocodone as needed.  Opiate cautions d/w pt. Work on arm swings to increase range of motion.  Use ice as needed.  If not better, then we may need to set her up with orthopedics.  She need to schedule routine follow-up with her PCP regarding diabetes, etc.  I did not restart her statin at this point.

## 2018-06-05 ENCOUNTER — Ambulatory Visit: Payer: Medicare Other

## 2018-06-06 ENCOUNTER — Other Ambulatory Visit: Payer: Self-pay | Admitting: Primary Care

## 2018-06-06 DIAGNOSIS — E119 Type 2 diabetes mellitus without complications: Secondary | ICD-10-CM

## 2018-06-06 DIAGNOSIS — I10 Essential (primary) hypertension: Secondary | ICD-10-CM

## 2018-06-06 DIAGNOSIS — E785 Hyperlipidemia, unspecified: Secondary | ICD-10-CM

## 2018-06-08 ENCOUNTER — Other Ambulatory Visit: Payer: Medicare Other

## 2018-06-08 ENCOUNTER — Ambulatory Visit: Payer: Medicare Other

## 2018-06-13 ENCOUNTER — Encounter: Payer: Medicare Other | Admitting: Primary Care

## 2018-06-26 ENCOUNTER — Other Ambulatory Visit: Payer: Self-pay | Admitting: Primary Care

## 2018-06-26 DIAGNOSIS — I10 Essential (primary) hypertension: Secondary | ICD-10-CM

## 2018-06-26 DIAGNOSIS — E119 Type 2 diabetes mellitus without complications: Secondary | ICD-10-CM

## 2018-06-26 DIAGNOSIS — E785 Hyperlipidemia, unspecified: Secondary | ICD-10-CM

## 2018-06-26 NOTE — Telephone Encounter (Signed)
Patient needs an in office visit for follow up and labs.  If she refuses to come in then needs web based visit.  I will only be able to refill for 30 days, if she doesn't follow up then I can't continue to refill.

## 2018-06-26 NOTE — Telephone Encounter (Signed)
Last prescribed on 03/30/2018 . Last office visit on 05/29/2018 with Dr Damita Dunnings for acute. No future appointment

## 2018-07-10 ENCOUNTER — Ambulatory Visit (INDEPENDENT_AMBULATORY_CARE_PROVIDER_SITE_OTHER): Payer: Medicare Other | Admitting: Primary Care

## 2018-07-10 ENCOUNTER — Other Ambulatory Visit: Payer: Self-pay

## 2018-07-10 ENCOUNTER — Encounter: Payer: Self-pay | Admitting: Primary Care

## 2018-07-10 VITALS — BP 126/80 | HR 72 | Temp 98.1°F | Ht 65.0 in | Wt 197.8 lb

## 2018-07-10 DIAGNOSIS — M25519 Pain in unspecified shoulder: Secondary | ICD-10-CM | POA: Diagnosis not present

## 2018-07-10 DIAGNOSIS — M542 Cervicalgia: Secondary | ICD-10-CM

## 2018-07-10 DIAGNOSIS — Z1239 Encounter for other screening for malignant neoplasm of breast: Secondary | ICD-10-CM

## 2018-07-10 DIAGNOSIS — G8929 Other chronic pain: Secondary | ICD-10-CM

## 2018-07-10 DIAGNOSIS — E785 Hyperlipidemia, unspecified: Secondary | ICD-10-CM

## 2018-07-10 DIAGNOSIS — I10 Essential (primary) hypertension: Secondary | ICD-10-CM | POA: Diagnosis not present

## 2018-07-10 DIAGNOSIS — E119 Type 2 diabetes mellitus without complications: Secondary | ICD-10-CM

## 2018-07-10 NOTE — Progress Notes (Signed)
Subjective:    Patient ID: Brenda Flores, female    DOB: [birth year], 73 y.o.   MRN: [id number]  HPI  Brenda Flores is a 73 year old female who presents today for follow up of chronic medical conditions.   1) Type 2 Diabetes:  Current medications include: Januvia 100 mg daily for which she is compliant to daily.  She does not currently check glucose levels.  Last A1C: 6.2 in March 2019 Last Eye Exam: No recent exam.  Last Foot Exam: Due today Pneumonia Vaccination: Completed in 2016 ACE/ARB: Urine microalbumin pending Statin: Atorvastatin, ran out one month ago  Diet currently consists of:  Breakfast: Boiled egg, toast, applesauce, fruit, oatmeal Lunch: Sandwich, salad Dinner: Fried protein, starch, vegetables  Snacks: Popcorn, cookies  Desserts: Daily  Beverages: Coffee with sugar, diet soda, water  Exercise: She is doing some walking.  2) Hyperlipidemia: Currently managed on atorvastatin 20 mg. Her last lipid panel was in September 2018 with LDL of 167. She was on simvastatin at the time so we switched her to atorvastatin. She's not had repeat lipid panel since. She has been out of her atorvastatin for one month.  3) Essential Hypertension/CHF: Currently managed on amlodipine 10 mg daily and carvedilol 6.25 mg BID. She denies chest pain, dizziness, shortness of breath.   BP Readings from Last 3 Encounters:  07/10/18 126/80  05/29/18 140/70  12/06/17 (!) 146/84   4) Shoulder/Neck Pain: Located to the right side which began several months ago. She was evaluated on 05/29/18 by Dr. Damita Dunnings and has since improved with ROM and pain. She's taking Tylenol 1000 mg every 4 hours now without improvement. She took Aleve once several weeks ago with some improvement. She is doing home physical therapy. Overall doing better but still needing something during the day for pain. The Norco was too strong.     Review of Systems  Constitutional: Negative for fatigue.  Eyes:  Negative for visual disturbance.  Respiratory: Negative for shortness of breath.   Cardiovascular: Negative for chest pain.  Musculoskeletal: Positive for arthralgias and neck pain.  Neurological: Negative for dizziness, weakness, numbness and headaches.       Past Medical History:  Diagnosis Date  . Achilles rupture, right   . Bronchitis   . CAD (coronary artery disease) Dr Burt Knack   non-obs CAD by cath in 7/07 (mLAD 30-40%, EF 65%)  . DDD (degenerative disc disease), lumbar   . DJD (degenerative joint disease)    KNEES  . GERD (gastroesophageal reflux disease)   . History of mitral valve prolapse    PER PT REPORTED MVP  PER DR HARSHAW FROM CARDIAC CATH IN 1987 (APPROX)  . 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 Hyperlipidemia   . Hypertension   . Left nephrolithiasis    NON-OBSTRUCTIVE  . Liver, polycystic   . Lumbar radiculopathy   . Osteoporosis   . PONV (postoperative nausea and vomiting)   . Right ureteral stone   . Type 2 diabetes mellitus ([place])      Social History   Socioeconomic History  . Marital status: Widowed    Spouse name: Not on file  . Number of children: Not on file  . Years of education: Not on file  . Highest education level: Not on file  Occupational History  . Not on file  Social Needs  . Financial resource strain: Not on file  . Food insecurity:  Worry: Not on file    Inability: Not on file  . Transportation needs:    Medical: Not on file    Non-medical: Not on file  Tobacco Use  . Smoking status: Never Smoker  . Smokeless tobacco: Never Used  Substance and Sexual Activity  . Alcohol use: No    Alcohol/week: 0.0 standard drinks  . Drug use: No  . Sexual activity: Not on file  Lifestyle  . Physical activity:    Days per week: Not on file    Minutes per session: Not on file  . Stress: Not on file  Relationships  . Social connections:    Talks on phone: Not on file    Gets  together: Not on file    Attends religious service: Not on file    Active member of club or organization: Not on file    Attends meetings of clubs or organizations: Not on file    Relationship status: Not on file  . Intimate partner violence:    Fear of current or ex partner: Not on file    Emotionally abused: Not on file    Physically abused: Not on file    Forced sexual activity: Not on file  Other Topics Concern  . Not on file  Social History Narrative  . Not on file    Past Surgical History:  Procedure Laterality Date  . ACHILLES TENDON SURGERY Right 05/26/2016   Procedure: RIGHT ACHILLES TENDON REPAIR,CALCANEUS SPUR EXCISION;  Surgeon: Ninetta Lights, MD;  Location: [facility];  Service: Orthopedics;  Laterality: Right;  . CARDIAC CATHETERIZATION  10-17-2005   DR COOPER   NON-OBSTRUCTIVE CAD/   mLAD 30-40%/   NORMAL LVF/  EF 65%  . CARDIOVASCULAR STRESS TEST  04-12-2011  DR Carleene Overlie TAYLOR   LOW RISK STUDY/  NO ISCHEMIA/  EF 68%  . CATARACT EXTRACTION W/ INTRAOCULAR LENS IMPLANT Right 2003  . COLONOSCOPY W/ POLYPECTOMY  03-17-2011  . CYSTOSCOPY WITH URETEROSCOPY Right 06/14/2013   Procedure: RIGHT  URETEROSCOPY,ureteral and urethral dilitation;  Surgeon: Claybon Jabs, MD;  [facility];  Service: Urology;  Laterality: Right;  . HEEL SPUR RESECTION Right 05/26/2016   Procedure: HEEL SPUR EXCISION;  Surgeon: Ninetta Lights, MD;  Location: [facility];  Service: Orthopedics;  Laterality: Right;  . HEEL SPUR SURGERY Left 2003  . TOTAL ABDOMINAL HYSTERECTOMY W/ BILATERAL SALPINGOOPHORECTOMY  1978  . TRANSTHORACIC ECHOCARDIOGRAM  04-07-2011   MILD LVH/  MILD FOCAL BASAL SEPTAL HYPERTROPHY/  EF  55-60% /  GRADE I DIASTOLIC DYSFUNCTION    Family History  Problem Relation Age of Onset  . Cancer Mother        breast  . Cancer Father        colon  . Cancer Brother        ?lung  . Breast cancer Neg Hx     Allergies  Allergen  Reactions  . Darvocet [Propoxyphene N-Acetaminophen] Nausea And Vomiting  . Tetanus Toxoids     TDAP hives, pt reports she had TD in the past w/ no issues    Current Outpatient Medications on File Prior to Visit  Medication Sig Dispense Refill  . amLODipine (NORVASC) 10 MG tablet Take 1 tablet by mouth every day for blood pressure. 30 tablet 0  . atorvastatin (LIPITOR) 20 MG tablet TAKE ONE TABLET BY MOUTH EVERY EVENING for cholesterol. 30 tablet 0  . carvedilol (COREG) 6.25 MG tablet Take 1 tablet (  6.25 mg total) by mouth 2 (two) times daily. For blood pressure. 60 tablet 0  . naproxen (NAPROSYN) 375 MG tablet Take 1 tablet (375 mg total) by mouth 2 (two) times daily. 20 tablet 0  . sitaGLIPtin (JANUVIA) 100 MG tablet Take 1 tablet (100 mg total) by mouth daily. For diabetes. 30 tablet 0   No current facility-administered medications on file prior to visit.     BP 126/80   Pulse 72   Temp 98.1 F (36.7 C) (Oral)   Ht 5\' 5"  (1.651 m)   Wt 197 lb 12 oz (89.7 kg)   SpO2 98%   BMI 32.91 kg/m    Objective:   Physical Exam  Constitutional: She appears well-nourished.  Neck: Neck supple.  Cardiovascular: Normal rate and regular rhythm.  Respiratory: Effort normal and breath sounds normal.  Skin: Skin is warm and dry.  Psychiatric: She has a normal mood and affect.           Assessment & Plan:

## 2018-07-10 NOTE — Assessment & Plan Note (Signed)
Repeat lipids pending, likely to be above goal as she's been out of statin for one month and hasn't filled the Rx prescribed earlier this month. Will dose atorvastatin based off of pending lipid panel.  Discussed the importance of a healthy diet and regular exercise in order for weight loss, and to reduce the risk of any potential medical problems.

## 2018-07-10 NOTE — Assessment & Plan Note (Signed)
Improving overall. Continue home exercises. Discussed to limit Tylenol to 3000 mg in 24 hours, okay to add in Aleve as needed.

## 2018-07-10 NOTE — Assessment & Plan Note (Signed)
Repeat A1C pending today. Encouraged her to work on diet.  Will resume statin. Urine microalbumin pending today. Foot exam today. Strongly advised she schedule an eye exam. Pneumonia vaccination UTD.  Follow up in 3-6 months based off of A1C result.

## 2018-07-10 NOTE — Assessment & Plan Note (Signed)
Stable in the office today, continue current regimen. BMP pending. 

## 2018-07-10 NOTE — Patient Instructions (Signed)
Stop by the lab prior to leaving today. I will notify you of your results once received.   It is important that you improve your diet. Please limit carbohydrates in the form of white bread, rice, pasta, sweets, fast food, fried food, sugary drinks, etc. Increase your consumption of fresh fruits and vegetables, whole grains, lean protein.  Ensure you are consuming 64 ounces of water daily.  Start exercising. You should be getting 150 minutes of exercise weekly.  You can alter the doses of your Tylenol and Aleve. Do not exceed 1000 mg of Tylenol and 1000 mg of Aleve in 24 hours. Continue to work on physical therapy exercises.   Please schedule a follow up appointment in 6 months.   It was a pleasure to see you today!

## 2018-07-11 LAB — LIPID PANEL
Cholesterol: 191 mg/dL (ref 0–200)
HDL: 40.2 mg/dL (ref >39.00)
LDL Cholesterol: 128 mg/dL — ABNORMAL HIGH (ref 0–99)
NonHDL: 150.68
Total CHOL/HDL Ratio: 5
Triglycerides: 112 mg/dL (ref 0.0–149.0)
VLDL: 22.4 mg/dL (ref 0.0–40.0)

## 2018-07-11 LAB — COMPREHENSIVE METABOLIC PANEL
ALT: 13 U/L (ref 0–35)
AST: 17 U/L (ref 0–37)
Albumin: 4.2 g/dL (ref 3.5–5.2)
Alkaline Phosphatase: 72 U/L (ref 39–117)
BUN: 15 mg/dL (ref 6–23)
CO2: 27 mEq/L (ref 19–32)
Calcium: 9.3 mg/dL (ref 8.4–10.5)
Chloride: 105 mEq/L (ref 96–112)
Creatinine, Ser: 0.85 mg/dL (ref 0.40–1.20)
GFR: 79.66 mL/min (ref >60.00)
Glucose, Bld: 89 mg/dL (ref 70–99)
Potassium: 3.8 mEq/L (ref 3.5–5.1)
Sodium: 141 mEq/L (ref 135–145)
Total Bilirubin: 0.5 mg/dL (ref 0.2–1.2)
Total Protein: 7.5 g/dL (ref 6.0–8.3)

## 2018-07-11 LAB — HEMOGLOBIN A1C: Hgb A1c MFr Bld: 6.5 % (ref 4.6–6.5)

## 2018-07-12 NOTE — Addendum Note (Signed)
Addended by: Ellamae Sia on: 07/12/2018 10:09 AM   Modules accepted: Orders

## 2018-07-13 ENCOUNTER — Encounter: Payer: Self-pay | Admitting: *Deleted

## 2018-07-16 ENCOUNTER — Telehealth: Payer: Self-pay | Admitting: Primary Care

## 2018-07-16 NOTE — Telephone Encounter (Signed)
Best number [phone] Pt returned your call regarding lab results

## 2018-07-16 NOTE — Telephone Encounter (Signed)
Can we get her set up with Dr. Lorelei Pont for further evaluation?  Tell her that I recommend he take a look. He may be able to do an injection which would be a better choice for pain management.

## 2018-07-16 NOTE — Telephone Encounter (Signed)
Spoken and notified patient of Brenda Flores comments regarding lab results. Patient stated is there something Brenda Flores can give her for the should pain. She did ask for Tylenol 3 and inform that it is a control medication. Please advise. She even stated that tramadol did not help with the pain.

## 2018-07-18 NOTE — Telephone Encounter (Signed)
Spoken and notified patient of Brenda Flores comments. Patient verbalized understanding. Patient will call back and get this schedule. She have to look at her work schedule.

## 2018-07-19 ENCOUNTER — Other Ambulatory Visit: Payer: Self-pay | Admitting: Primary Care

## 2018-07-19 DIAGNOSIS — E785 Hyperlipidemia, unspecified: Secondary | ICD-10-CM

## 2018-07-19 DIAGNOSIS — E119 Type 2 diabetes mellitus without complications: Secondary | ICD-10-CM

## 2018-07-19 DIAGNOSIS — I10 Essential (primary) hypertension: Secondary | ICD-10-CM

## 2018-09-26 ENCOUNTER — Ambulatory Visit (INDEPENDENT_AMBULATORY_CARE_PROVIDER_SITE_OTHER): Payer: Medicare Other | Admitting: Primary Care

## 2018-09-26 ENCOUNTER — Ambulatory Visit (INDEPENDENT_AMBULATORY_CARE_PROVIDER_SITE_OTHER)
Admission: RE | Admit: 2018-09-26 | Discharge: 2018-09-26 | Disposition: A | Discharge status: Home / Self Care | Payer: Medicare Other | Source: Ambulatory Visit | Attending: Primary Care | Admitting: Primary Care

## 2018-09-26 ENCOUNTER — Encounter: Payer: Self-pay | Admitting: Primary Care

## 2018-09-26 ENCOUNTER — Other Ambulatory Visit: Payer: Self-pay

## 2018-09-26 VITALS — BP 120/70 | HR 89 | Temp 97.6°F | Ht 65.0 in | Wt 199.0 lb

## 2018-09-26 DIAGNOSIS — E119 Type 2 diabetes mellitus without complications: Secondary | ICD-10-CM

## 2018-09-26 DIAGNOSIS — G8929 Other chronic pain: Secondary | ICD-10-CM

## 2018-09-26 DIAGNOSIS — M79672 Pain in left foot: Secondary | ICD-10-CM

## 2018-09-26 DIAGNOSIS — E785 Hyperlipidemia, unspecified: Secondary | ICD-10-CM

## 2018-09-26 DIAGNOSIS — M1712 Unilateral primary osteoarthritis, left knee: Secondary | ICD-10-CM | POA: Diagnosis not present

## 2018-09-26 DIAGNOSIS — I10 Essential (primary) hypertension: Secondary | ICD-10-CM | POA: Diagnosis not present

## 2018-09-26 HISTORY — DX: Pain in left foot: M79.672

## 2018-09-26 MED ORDER — PREDNISONE 10 MG PO TABS: ORAL_TABLET | ORAL | 0 refills | Status: DC

## 2018-09-26 MED ORDER — KETOROLAC TROMETHAMINE 60 MG/2ML IM SOLN
60.0000 mg | Freq: Once | INTRAMUSCULAR | Status: AC
  Administered 2018-09-26: 60 mg via INTRAMUSCULAR

## 2018-09-26 NOTE — Progress Notes (Signed)
Subjective:    Patient ID: Brenda Flores, female    DOB: 25-Jan-1946, 73 y.o.   MRN: [id number]  HPI  [name] is a 73 year old female with a history of osteoarthritis, hypertension, type 2 diabetes, obesity who presents today with a chief complaint of foot pain. She is also experiencing bilateral thumb pain and stiffness from chronic arthritis. She is also due for repeat urine microalbumin.   Her pain is located to the entire left dorsal foot with radiation to the left hip. She fell on top of her foot in March 2020 when falling after hiking with her daughter this is when her symptoms began.   Her pain is constant, worse with walking and movement in the foot. She has intermittent numbness/tingling to the left lower extremity, intermittent left dorsal foot swelling. She denies changes/increased pain to her lower back.   She's been taking Tylenol and Aleve for her foot pain without improvement.   Bilateral thumb pain with decrease in range of motion and some swelling for a few days. Feels like her typical arthritis pain.  Review of Systems  Cardiovascular: Positive for leg swelling.  Musculoskeletal: Positive for arthralgias and joint swelling.  Skin: Negative for color change.  Neurological: Positive for numbness.       Past Medical History:  Diagnosis Date  . Achilles rupture, right   . Bronchitis   . CAD (coronary artery disease) Dr Burt Knack   non-obs CAD by cath in 7/07 (mLAD 30-40%, EF 65%)  . DDD (degenerative disc disease), lumbar   . DJD (degenerative joint disease)    KNEES  . GERD (gastroesophageal reflux disease)   . History of mitral valve prolapse    PER PT REPORTED MVP  PER DR HARSHAW FROM CARDIAC CATH IN 1987 (APPROX)  . 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 Hyperlipidemia   . Hypertension   . Left nephrolithiasis    NON-OBSTRUCTIVE  . Liver, polycystic   . Lumbar radiculopathy   . Osteoporosis    . PONV (postoperative nausea and vomiting)   . Right ureteral stone   . Type 2 diabetes mellitus ([place])      Social History   Socioeconomic History  . Marital status: Widowed    Spouse name: Not on file  . Number of children: Not on file  . Years of education: Not on file  . Highest education level: Not on file  Occupational History  . Not on file  Social Needs  . Financial resource strain: Not on file  . Food insecurity    Worry: Not on file    Inability: Not on file  . Transportation needs    Medical: Not on file    Non-medical: Not on file  Tobacco Use  . Smoking status: Never Smoker  . Smokeless tobacco: Never Used  Substance and Sexual Activity  . Alcohol use: No    Alcohol/week: 0.0 standard drinks  . Drug use: No  . Sexual activity: Not on file  Lifestyle  . Physical activity    Days per week: Not on file    Minutes per session: Not on file  . Stress: Not on file  Relationships  . Social Herbalist on phone: Not on file    Gets together: Not on file    Attends religious service: Not on file    Active member of club or organization: Not on file  Attends meetings of clubs or organizations: Not on file    Relationship status: Not on file  . Intimate partner violence    Fear of current or ex partner: Not on file    Emotionally abused: Not on file    Physically abused: Not on file    Forced sexual activity: Not on file  Other Topics Concern  . Not on file  Social History Narrative  . Not on file    Past Surgical History:  Procedure Laterality Date  . ACHILLES TENDON SURGERY Right 05/26/2016   Procedure: RIGHT ACHILLES TENDON REPAIR,CALCANEUS SPUR EXCISION;  Surgeon: Ninetta Lights, MD;  Location: [facility];  Service: Orthopedics;  Laterality: Right;  . CARDIAC CATHETERIZATION  10-17-2005   DR COOPER   NON-OBSTRUCTIVE CAD/   mLAD 30-40%/   NORMAL LVF/  EF 65%  . CARDIOVASCULAR STRESS TEST  04-12-2011  DR Carleene Overlie TAYLOR   LOW  RISK STUDY/  NO ISCHEMIA/  EF 68%  . CATARACT EXTRACTION W/ INTRAOCULAR LENS IMPLANT Right 2003  . COLONOSCOPY W/ POLYPECTOMY  03-17-2011  . CYSTOSCOPY WITH URETEROSCOPY Right 06/14/2013   Procedure: RIGHT  URETEROSCOPY,ureteral and urethral dilitation;  Surgeon: Claybon Jabs, MD;  Location[facility];  Service: Urology;  Laterality: Right;  . HEEL SPUR RESECTION Right 05/26/2016   Procedure: HEEL SPUR EXCISION;  Surgeon: Ninetta Lights, MD;  Location: [place];  Service: Orthopedics;  Laterality: Right;  . HEEL SPUR SURGERY Left 2003  . TOTAL ABDOMINAL HYSTERECTOMY W/ BILATERAL SALPINGOOPHORECTOMY  1978  . TRANSTHORACIC ECHOCARDIOGRAM  04-07-2011   MILD LVH/  MILD FOCAL BASAL SEPTAL HYPERTROPHY/  EF  55-60% /  GRADE I DIASTOLIC DYSFUNCTION    Family History  Problem Relation Age of Onset  . Cancer Mother        breast  . Cancer Father        colon  . Cancer Brother        ?lung  . Breast cancer Neg Hx     Allergies  Allergen Reactions  . Darvocet [Propoxyphene N-Acetaminophen] Nausea And Vomiting  . Tetanus Toxoids     TDAP hives, pt reports she had TD in the past w/ no issues    Current Outpatient Medications on File Prior to Visit  Medication Sig Dispense Refill  . amLODipine (NORVASC) 10 MG tablet Take 1 tablet by mouth every day 90 tablet 0  . atorvastatin (LIPITOR) 20 MG tablet TAKE ONE TABLET BY MOUTH EVERY EVENING 90 tablet 0  . carvedilol (COREG) 6.25 MG tablet Take 1 tablet by mouth twice daily 180 tablet 0  . JANUVIA 100 MG tablet Take 1 tablet by mouth daily 90 tablet 0  . naproxen (NAPROSYN) 375 MG tablet Take 1 tablet (375 mg total) by mouth 2 (two) times daily. 20 tablet 0   No current facility-administered medications on file prior to visit.     BP 120/70   Pulse 89   Temp 97.6 F (36.4 C) (Temporal)   Ht 5\' 5"  (1.651 m)   Wt 199 lb (90.3 kg)   SpO2 98%   BMI 33.12 kg/m    Objective:   Physical Exam   Constitutional: She appears well-nourished.  Respiratory: Effort normal.  Musculoskeletal:     Left foot: Decreased range of motion. Tenderness and bony tenderness present. No deformity.       Feet:     Comments: Tenderness to nearly entire dorsal foot. Mild swelling. No erythema.  Pain with ROM with flexion, extension, and rotation.   Skin: Skin is warm and dry. No erythema.           Assessment & Plan:

## 2018-09-26 NOTE — Addendum Note (Signed)
Addended by: Jacqualin Combes on: 09/26/2018 02:57 PM   Modules accepted: Orders

## 2018-09-26 NOTE — Addendum Note (Signed)
Addended by: Ellamae Sia on: 09/26/2018 02:50 PM   Modules accepted: Orders

## 2018-09-26 NOTE — Patient Instructions (Addendum)
Complete xray(s) and labs prior to leaving today. I will notify you of your results once received.  Start prednisone tablets tomorrow. Take three tablets for 2 days, then two tablets for 2 days, then one tablet for 2 days.  It was a pleasure to see you today!

## 2018-09-26 NOTE — Assessment & Plan Note (Addendum)
Chronic since fall in March 2020. Exam today with moderate sensitivity upon palpation.  Suspect nerve involvement as well.  Check plain films of the foot today. Check labs including uric acid, CBC, BMP.  Rx for low dose prednisone course sent for radiculopathy, inflammation, numbness. This will start tomorrow. IM Toradol provided today. Await results.

## 2018-09-26 NOTE — Assessment & Plan Note (Signed)
Evident to thumbs bilaterally. Treat with low dose steroid course starting tomorrow.  IM Toradol provided today.

## 2018-09-26 NOTE — Addendum Note (Signed)
Addended by: Ellamae Sia on: 09/26/2018 02:39 PM   Modules accepted: Orders

## 2018-09-27 ENCOUNTER — Telehealth: Payer: Self-pay | Admitting: Primary Care

## 2018-09-27 ENCOUNTER — Other Ambulatory Visit: Payer: Self-pay | Admitting: Primary Care

## 2018-09-27 DIAGNOSIS — G8929 Other chronic pain: Secondary | ICD-10-CM

## 2018-09-27 DIAGNOSIS — E785 Hyperlipidemia, unspecified: Secondary | ICD-10-CM

## 2018-09-27 LAB — BASIC METABOLIC PANEL
BUN: 15 mg/dL (ref 6–23)
CO2: 30 mEq/L (ref 19–32)
Calcium: 9.1 mg/dL (ref 8.4–10.5)
Chloride: 105 mEq/L (ref 96–112)
Creatinine, Ser: 1.02 mg/dL (ref 0.40–1.20)
GFR: 64.5 mL/min (ref >60.00)
Glucose, Bld: 114 mg/dL — ABNORMAL HIGH (ref 70–99)
Potassium: 4 mEq/L (ref 3.5–5.1)
Sodium: 142 mEq/L (ref 135–145)

## 2018-09-27 LAB — CBC
HCT: 38.8 % (ref 36.0–46.0)
Hemoglobin: 12.7 g/dL (ref 12.0–15.0)
MCHC: 32.6 g/dL (ref 30.0–36.0)
MCV: 89.7 fl (ref 78.0–100.0)
Platelets: 213 10*3/uL (ref 150.0–400.0)
RBC: 4.33 Mil/uL (ref 3.87–5.11)
RDW: 15.6 % — ABNORMAL HIGH (ref 11.5–15.5)
WBC: 7.3 10*3/uL (ref 4.0–10.5)

## 2018-09-27 LAB — LIPID PANEL
Cholesterol: 264 mg/dL — ABNORMAL HIGH (ref 0–200)
HDL: 41.2 mg/dL (ref >39.00)
NonHDL: 222.67
Total CHOL/HDL Ratio: 6
Triglycerides: 265 mg/dL — ABNORMAL HIGH (ref 0.0–149.0)
VLDL: 53 mg/dL — ABNORMAL HIGH (ref 0.0–40.0)

## 2018-09-27 LAB — URIC ACID: Uric Acid, Serum: 4.5 mg/dL (ref 2.4–7.0)

## 2018-09-27 LAB — MICROALBUMIN / CREATININE URINE RATIO
Creatinine,U: 388.6 mg/dL
Microalb Creat Ratio: 3.1 mg/g (ref 0.0–30.0)
Microalb, Ur: 12.2 mg/dL — ABNORMAL HIGH (ref 0.0–1.9)

## 2018-09-27 LAB — LDL CHOLESTEROL, DIRECT: Direct LDL: 195 mg/dL

## 2018-09-27 NOTE — Telephone Encounter (Signed)
Patient received lab results but forgot to ask if something could be called in for her pain She stated that she can not take tramadol '   [facility] [address]- [employer]

## 2018-09-27 NOTE — Telephone Encounter (Signed)
Please notify patient that the prednisone should help to decrease inflammation but can't be taken with anything else except for tylenol. She could get some OTC diclofenac gel (Voltaren Gel) to rub over the foot for now. Once she finishes the prednisone we can send in diclofenac.  Is she agreeable to seeing podiatry? See result note.

## 2018-09-28 NOTE — Telephone Encounter (Signed)
Patient called back. Notified patient of Tawni Millers comments. Patient verbalized understanding. Patient will call back when she complete the prednisone.   Patient is agreeable to see podiatry and prefer in [facility]

## 2018-09-28 NOTE — Telephone Encounter (Signed)
Noted.  Referral to podiatry placed.

## 2018-09-28 NOTE — Telephone Encounter (Signed)
Tried to call patient but could not leave message since voicemail box is full

## 2018-10-04 ENCOUNTER — Telehealth: Payer: Self-pay | Admitting: Primary Care

## 2018-10-04 DIAGNOSIS — M79672 Pain in left foot: Secondary | ICD-10-CM

## 2018-10-04 DIAGNOSIS — G8929 Other chronic pain: Secondary | ICD-10-CM

## 2018-10-04 NOTE — Telephone Encounter (Signed)
Please notify patient that the options are limited. We can try a different medication like diclofenac for pain, it's in the same family as naproxen but may not bother her stomach if she eats. This is our last option.   Will also send message to Rosaria Ferries to see if we can get her connected with Podiatry sooner rather than later.

## 2018-10-04 NOTE — Telephone Encounter (Signed)
Pt finished Prednisone 09-28-2018.  She still has a lot of pain and would like pain Rx.  Pain is worse at night.  Has been taking Tylenol as Aleve hurts her stomach.  Pt doesn't want Tramadol.

## 2018-10-05 MED ORDER — DICLOFENAC SODIUM 75 MG PO TBEC: 75.0000 mg | DELAYED_RELEASE_TABLET | Freq: Two times a day (BID) | ORAL | 0 refills | Status: DC

## 2018-10-05 NOTE — Telephone Encounter (Signed)
Spoken and notified patient of Tawni Millers comments. Patient is agreeable to the diclofenac, [facility] [place].

## 2018-10-05 NOTE — Telephone Encounter (Signed)
Noted, Rx for diclofenac sent to pharmacy.

## 2018-10-09 ENCOUNTER — Ambulatory Visit (INDEPENDENT_AMBULATORY_CARE_PROVIDER_SITE_OTHER): Payer: Medicare Other

## 2018-10-09 ENCOUNTER — Ambulatory Visit (INDEPENDENT_AMBULATORY_CARE_PROVIDER_SITE_OTHER): Payer: Medicare Other | Admitting: Podiatry

## 2018-10-09 ENCOUNTER — Encounter: Payer: Self-pay | Admitting: Podiatry

## 2018-10-09 ENCOUNTER — Other Ambulatory Visit: Payer: Self-pay

## 2018-10-09 VITALS — BP 149/75 | HR 72 | Temp 97.8°F | Resp 16

## 2018-10-09 DIAGNOSIS — M79672 Pain in left foot: Secondary | ICD-10-CM | POA: Diagnosis not present

## 2018-10-09 DIAGNOSIS — M7989 Other specified soft tissue disorders: Secondary | ICD-10-CM

## 2018-10-09 DIAGNOSIS — M779 Enthesopathy, unspecified: Secondary | ICD-10-CM | POA: Diagnosis not present

## 2018-10-09 DIAGNOSIS — G8929 Other chronic pain: Secondary | ICD-10-CM | POA: Diagnosis not present

## 2018-10-09 NOTE — Progress Notes (Signed)
Subjective:   Patient ID: Brenda Flores, female   DOB: 73 y.o.   MRN: [id number]   HPI 73 year old female presents the office with concerns of left foot pain.  She states that she been having pain and swelling to the top of her left foot approximately 8 months however she did go on a hike last month with her daughter she states that she had a fall which did aggravate her foot as well but she was having pain prior to this.  She states that tender the top of the foot and she states it is painful with shoes.  States it hurts to walk at times.  She is seen her primary care physician she was started on prednisone.  This was somewhat helpful.   Review of Systems  All other systems reviewed and are negative.  Past Medical History:  Diagnosis Date  . Achilles rupture, right   . Bronchitis   . CAD (coronary artery disease) Dr Burt Knack   non-obs CAD by cath in 7/07 (mLAD 30-40%, EF 65%)  . DDD (degenerative disc disease), lumbar   . DJD (degenerative joint disease)    KNEES  . GERD (gastroesophageal reflux disease)   . History of mitral valve prolapse    PER PT REPORTED MVP  PER DR HARSHAW FROM CARDIAC CATH IN 1987 (APPROX)  . 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 Hyperlipidemia   . Hypertension   . Left nephrolithiasis    NON-OBSTRUCTIVE  . Liver, polycystic   . Lumbar radiculopathy   . Osteoporosis   . PONV (postoperative nausea and vomiting)   . Right ureteral stone   . Type 2 diabetes mellitus ([place])     Past Surgical History:  Procedure Laterality Date  . ACHILLES TENDON SURGERY Right 05/26/2016   Procedure: RIGHT ACHILLES TENDON REPAIR,CALCANEUS SPUR EXCISION;  Surgeon: Ninetta Lights, MD;  Location: [facility];  Service: Orthopedics;  Laterality: Right;  . CARDIAC CATHETERIZATION  10-17-2005   DR COOPER   NON-OBSTRUCTIVE CAD/   mLAD 30-40%/   NORMAL LVF/  EF 65%  . CARDIOVASCULAR STRESS TEST  04-12-2011  DR  Carleene Overlie TAYLOR   LOW RISK STUDY/  NO ISCHEMIA/  EF 68%  . CATARACT EXTRACTION W/ INTRAOCULAR LENS IMPLANT Right 2003  . COLONOSCOPY W/ POLYPECTOMY  03-17-2011  . CYSTOSCOPY WITH URETEROSCOPY Right 06/14/2013   Procedure: RIGHT  URETEROSCOPY,ureteral and urethral dilitation;  Surgeon: Claybon Jabs, MD;  Location: [facility];  Service: Urology;  Laterality: Right;  . HEEL SPUR RESECTION Right 05/26/2016   Procedure: HEEL SPUR EXCISION;  Surgeon: Ninetta Lights, MD;  Location: [place];  Service: Orthopedics;  Laterality: Right;  . HEEL SPUR SURGERY Left 2003  . TOTAL ABDOMINAL HYSTERECTOMY W/ BILATERAL SALPINGOOPHORECTOMY  1978  . TRANSTHORACIC ECHOCARDIOGRAM  04-07-2011   MILD LVH/  MILD FOCAL BASAL SEPTAL HYPERTROPHY/  EF  55-60% /  GRADE I DIASTOLIC DYSFUNCTION     Current Outpatient Medications:  .  amLODipine (NORVASC) 10 MG tablet, Take 1 tablet by mouth every day, Disp: 90 tablet, Rfl: 0 .  atorvastatin (LIPITOR) 20 MG tablet, TAKE ONE TABLET BY MOUTH EVERY EVENING, Disp: 90 tablet, Rfl: 0 .  carvedilol (COREG) 6.25 MG tablet, Take 1 tablet by mouth twice daily, Disp: 180 tablet, Rfl: 0 .  diclofenac (VOLTAREN) 75 MG EC tablet, Take 1 tablet (75 mg total) by mouth 2 (two) times daily  with a meal. As needed for pain., Disp: 60 tablet, Rfl: 0 .  JANUVIA 100 MG tablet, Take 1 tablet by mouth daily, Disp: 90 tablet, Rfl: 0  Allergies  Allergen Reactions  . Darvocet [Propoxyphene N-Acetaminophen] Nausea And Vomiting  . Tetanus Toxoids     TDAP hives, pt reports she had TD in the past w/ no issues        Objective:  Physical Exam  General: AAO x3, NAD  Dermatological: Skin is warm, dry and supple bilateral. Nails x 10 are well manicured; remaining integument appears unremarkable at this time. There are no open sores, no preulcerative lesions, no rash or signs of infection present.  Vascular: Dorsalis Pedis artery and Posterior Tibial artery pedal  pulses are 2/4 bilateral with immedate capillary fill time.  There is no pain with calf compression, swelling, warmth, erythema.   Neruologic: Grossly intact via light touch bilateral.Protective threshold with Semmes Wienstein monofilament intact to all pedal sites bilateral.  Musculoskeletal: There is edema on the dorsal aspect of the left foot as well as the ankle.  The majority tenderness is on the dorsal aspect of the midfoot along Lisfranc joint.  There is no specific area pinpoint tenderness but is diffuse across the top of the foot.  No erythema or warmth associated with swelling.  No pain with ankle range of motion today with myself.  No pain to the digits.  Muscular strength 5/5 in all groups tested bilateral.     Assessment:   Left foot swelling, capsulitis     Plan:  -Treatment options discussed including all alternatives, risks, and complications -Etiology of symptoms were discussed -X-rays were obtained and reviewed with the patient.  No evidence of acute fracture identified today.  On the oblique view there is a small bone spur, old bone fragment present on the dorsal medial aspect of the foot. -Dispensed shoe today.  Also need Unna boot was applied.  Precautions were presented with this.  Once this is removed we will do Voltaren gel which I ordered.  Ice the area as well.  Elevation.  I will see her back next 2 to 3 weeks if symptoms not improved will order an MRI. -Reviewed blood work including uric acid, primary care physician.  Trula Slade DPM

## 2018-10-09 NOTE — Patient Instructions (Signed)
Remove the compression wrap in 5 days or sooner if there is any increase in pain, swelling or discomfort.  Once you remove it you can star the Voltaren (diclofenac gel).  Ice the top of the foot as well Wear boot when walking I will see you in 2 weeks  If was nice to meet you today. If you have any questions or any further concerns, please feel fee to give me a call. You can call our office at [phone] or please feel fee to send me a message through [place].

## 2018-10-16 ENCOUNTER — Other Ambulatory Visit: Payer: Self-pay | Admitting: Primary Care

## 2018-10-16 DIAGNOSIS — I10 Essential (primary) hypertension: Secondary | ICD-10-CM

## 2018-10-16 DIAGNOSIS — E119 Type 2 diabetes mellitus without complications: Secondary | ICD-10-CM

## 2018-10-16 DIAGNOSIS — E785 Hyperlipidemia, unspecified: Secondary | ICD-10-CM

## 2018-10-23 ENCOUNTER — Ambulatory Visit: Payer: Medicare Other | Admitting: Podiatry

## 2018-11-01 ENCOUNTER — Telehealth: Payer: Self-pay | Admitting: Primary Care

## 2018-11-01 NOTE — Telephone Encounter (Signed)
Pt wants a referral to a podiatrist. [place] preference.   Pt said she has a callus under left big two and she has a spur on top of same foot and it is giving her a fit.    CB [phone]

## 2018-11-02 NOTE — Telephone Encounter (Signed)
It appears that she is seeing a podiatrist already, Dr. Jacqualyn Posey? She wanted to see another podiatrist?  Or orthopedist?

## 2018-11-05 NOTE — Telephone Encounter (Signed)
Message left for patient to return my call.  

## 2018-11-07 ENCOUNTER — Other Ambulatory Visit: Payer: Self-pay | Admitting: Primary Care

## 2018-11-07 DIAGNOSIS — G8929 Other chronic pain: Secondary | ICD-10-CM

## 2018-11-07 DIAGNOSIS — M79672 Pain in left foot: Secondary | ICD-10-CM

## 2018-11-08 NOTE — Telephone Encounter (Signed)
Last prescribed on 10/05/2018. Last appointment on  09/26/2018. No future appointment

## 2018-11-08 NOTE — Telephone Encounter (Signed)
Patient returned a call from the office.  Does the patient need to schedule a follow up visit for refill?

## 2018-11-09 NOTE — Telephone Encounter (Signed)
Is she still taking this for her foot pain? Is she still seeing the foot doctor? What about the diclofenac gel that the foot doctor recommended?  I'm happy to refill but I need some clarification first.

## 2018-11-09 NOTE — Telephone Encounter (Signed)
Message left for patient to return my call.  

## 2018-11-13 NOTE — Telephone Encounter (Signed)
Message left for patient to return my call on 11/09/2018 and today 11/13/2018

## 2018-11-13 NOTE — Telephone Encounter (Signed)
Message left for patient to return my call.  

## 2019-01-09 ENCOUNTER — Ambulatory Visit: Payer: Medicare Other | Admitting: Primary Care

## 2019-01-16 ENCOUNTER — Other Ambulatory Visit: Payer: Self-pay | Admitting: Primary Care

## 2019-01-16 DIAGNOSIS — E785 Hyperlipidemia, unspecified: Secondary | ICD-10-CM

## 2019-01-16 DIAGNOSIS — I10 Essential (primary) hypertension: Secondary | ICD-10-CM

## 2019-01-16 DIAGNOSIS — E119 Type 2 diabetes mellitus without complications: Secondary | ICD-10-CM

## 2019-02-13 ENCOUNTER — Other Ambulatory Visit: Payer: Self-pay

## 2019-02-19 ENCOUNTER — Encounter: Payer: Self-pay | Admitting: Primary Care

## 2019-02-19 ENCOUNTER — Other Ambulatory Visit: Payer: Self-pay

## 2019-02-19 ENCOUNTER — Ambulatory Visit (INDEPENDENT_AMBULATORY_CARE_PROVIDER_SITE_OTHER): Payer: Medicare Other | Admitting: Primary Care

## 2019-02-19 VITALS — BP 130/84 | HR 64 | Temp 97.8°F | Ht 65.0 in | Wt 200.0 lb

## 2019-02-19 DIAGNOSIS — R079 Chest pain, unspecified: Secondary | ICD-10-CM | POA: Diagnosis not present

## 2019-02-19 DIAGNOSIS — M79672 Pain in left foot: Secondary | ICD-10-CM | POA: Diagnosis not present

## 2019-02-19 DIAGNOSIS — E785 Hyperlipidemia, unspecified: Secondary | ICD-10-CM | POA: Diagnosis not present

## 2019-02-19 DIAGNOSIS — G8929 Other chronic pain: Secondary | ICD-10-CM | POA: Diagnosis not present

## 2019-02-19 DIAGNOSIS — M1712 Unilateral primary osteoarthritis, left knee: Secondary | ICD-10-CM

## 2019-02-19 DIAGNOSIS — I10 Essential (primary) hypertension: Secondary | ICD-10-CM

## 2019-02-19 DIAGNOSIS — M25542 Pain in joints of left hand: Secondary | ICD-10-CM

## 2019-02-19 DIAGNOSIS — I2510 Atherosclerotic heart disease of native coronary artery without angina pectoris: Secondary | ICD-10-CM | POA: Diagnosis not present

## 2019-02-19 DIAGNOSIS — M25541 Pain in joints of right hand: Secondary | ICD-10-CM

## 2019-02-19 DIAGNOSIS — E119 Type 2 diabetes mellitus without complications: Secondary | ICD-10-CM | POA: Diagnosis not present

## 2019-02-19 LAB — HEPATIC FUNCTION PANEL
ALT: 12 U/L (ref 0–35)
AST: 13 U/L (ref 0–37)
Albumin: 4.1 g/dL (ref 3.5–5.2)
Alkaline Phosphatase: 80 U/L (ref 39–117)
Bilirubin, Direct: 0.1 mg/dL (ref 0.0–0.3)
Total Bilirubin: 0.6 mg/dL (ref 0.2–1.2)
Total Protein: 7.3 g/dL (ref 6.0–8.3)

## 2019-02-19 LAB — CBC
HCT: 38.1 % (ref 36.0–46.0)
Hemoglobin: 12.4 g/dL (ref 12.0–15.0)
MCHC: 32.6 g/dL (ref 30.0–36.0)
MCV: 88.6 fl (ref 78.0–100.0)
Platelets: 203 10*3/uL (ref 150.0–400.0)
RBC: 4.3 Mil/uL (ref 3.87–5.11)
RDW: 15 % (ref 11.5–15.5)
WBC: 5.8 10*3/uL (ref 4.0–10.5)

## 2019-02-19 LAB — URIC ACID: Uric Acid, Serum: 4 mg/dL (ref 2.4–7.0)

## 2019-02-19 LAB — LIPID PANEL
Cholesterol: 222 mg/dL — ABNORMAL HIGH (ref 0–200)
HDL: 39.9 mg/dL (ref >39.00)
LDL Cholesterol: 158 mg/dL — ABNORMAL HIGH (ref 0–99)
NonHDL: 182.42
Total CHOL/HDL Ratio: 6
Triglycerides: 124 mg/dL (ref 0.0–149.0)
VLDL: 24.8 mg/dL (ref 0.0–40.0)

## 2019-02-19 LAB — C-REACTIVE PROTEIN: CRP: <1 mg/dL (ref 0.5–20.0)

## 2019-02-19 LAB — HEMOGLOBIN A1C: Hgb A1c MFr Bld: 6.4 % (ref 4.6–6.5)

## 2019-02-19 LAB — SEDIMENTATION RATE: Sed Rate: 25 mm/h (ref 0–30)

## 2019-02-19 NOTE — Assessment & Plan Note (Signed)
Has been non compliant to statin therapy intermittently during 2020, endorses compliance now. Strongly advised she stay on this medication daily.  Repeat lipids pending.

## 2019-02-19 NOTE — Assessment & Plan Note (Signed)
Stable in the office today, continue current regimen. BMP is UTD.

## 2019-02-19 NOTE — Assessment & Plan Note (Signed)
Compliant to prescribed regimen, A1C pending. Managed on statin. Urine microalbumin UTD. Foot exam next visit. Pneumonia vaccination UTD.  Follow up in 6 months.

## 2019-02-19 NOTE — Assessment & Plan Note (Signed)
Suspicious symptoms of CAD with exertional chest pain that is relieved with rest.   ECG today without acute ST changes. NSR. BBB noted. Appears very similar to ECG from 2018.  Check labs today. Urgent referral placed to cardiology for evaluation and likely stress test.   Managed on statin and endorsees compliance. Start aspirin 81 mg. She appears stable for outpatient treatment, strict ED precautions provided.

## 2019-02-19 NOTE — Assessment & Plan Note (Signed)
No improvement with OTC treatment or diclofenac. Check labs today to rule out RA and gout. Consider low dose steroid taper. Consider rheumatology evaluation.

## 2019-02-19 NOTE — Progress Notes (Signed)
Subjective:    Patient ID: Brenda Flores, female    DOB: [birth year], 73 y.o.   MRN: [id number]  HPI  Brenda Flores is a 73 year old female with a history of type 2 diabetes, hyperlipidemia, hypertension, CAD who presents today for follow up and with multiple complaints.   1) Type 2 Diabetes:   Current medications include: Januvia 100 mg   Last A1C: 6.5 in April 2020 Last Eye Exam: Due, this was cancelled by the eye doctor Last Foot Exam: Due in April 2021 Pneumonia Vaccination: Completed last in 2018 ACE/ARB: Urine microalbumin negative in July 2020 Statin: Lipitor  2) Hyperlipidemia: Chronic. Currently prescribed atorvastatin 20 mg. During her last visit she had not been taking her atorvastatin, lipid panel in April 2020 above goal. Repeat lipid panel in July 2020 above goal, patient admitted stopping Lipitor 2-3 weeks prior.  Today she endorses daily compliance to atorvastatin 20 mg daily.  BP Readings from Last 3 Encounters:  02/19/19 130/84  10/09/18 (!) 149/75  09/26/18 120/70   3) Osteoarthritis: Chronic stiffness to joints of her hands, specifically to bilateral thumbs. She has difficulty opening jars, opening the door knob to the inside of the house due to pain. She thinks she was evaluated in the past for her hands, was told that she has arthritis. Doesn't recall being tested for RA or gout. No other pain except for to her bilateral thumbs, also with swelling. She's taking Tylenol, Ibuprofen, diclofenac without improvement.   4) Chest Pain: Intermittent to the left chest with pain down her left upper extremity. This has been present intermittently for two weeks, worse with exertion, improved with rest, also with constipation and abdominal discomfort. She was evaluated by cardiology "a while ago" and was told that her heart was "fine". Doesn't recall when she was evaluated.     Review of Systems  Constitutional: Negative for fatigue.  Respiratory: Negative  for shortness of breath.   Cardiovascular: Positive for chest pain.  Gastrointestinal: Positive for constipation. Negative for diarrhea, nausea and vomiting.       Generalized abdominal discomfort  Musculoskeletal: Positive for arthralgias.  Neurological: Negative for dizziness and headaches.       Past Medical History:  Diagnosis Date  . Achilles rupture, right   . Bronchitis   . CAD (coronary artery disease) Dr Burt Knack   non-obs CAD by cath in 7/07 (mLAD 30-40%, EF 65%)  . DDD (degenerative disc disease), lumbar   . DJD (degenerative joint disease)    KNEES  . GERD (gastroesophageal reflux disease)   . History of mitral valve prolapse    PER PT REPORTED MVP  PER DR HARSHAW FROM CARDIAC CATH IN 1987 (APPROX)  . 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 Hyperlipidemia   . Hypertension   . Left nephrolithiasis    NON-OBSTRUCTIVE  . Liver, polycystic   . Lumbar radiculopathy   . Osteoporosis   . PONV (postoperative nausea and vomiting)   . Right ureteral stone   . Type 2 diabetes mellitus ([place])      Social History   Socioeconomic History  . Marital status: Widowed    Spouse name: Not on file  . Number of children: Not on file  . Years of education: Not on file  . Highest education level: Not on file  Occupational History  . Not on file  Social Needs  . Financial resource strain: Not on file  .  Food insecurity    Worry: Not on file    Inability: Not on file  . Transportation needs    Medical: Not on file    Non-medical: Not on file  Tobacco Use  . Smoking status: Never Smoker  . Smokeless tobacco: Never Used  Substance and Sexual Activity  . Alcohol use: No    Alcohol/week: 0.0 standard drinks  . Drug use: No  . Sexual activity: Not on file  Lifestyle  . Physical activity    Days per week: Not on file    Minutes per session: Not on file  . Stress: Not on file  Relationships  . Social Herbalist on  phone: Not on file    Gets together: Not on file    Attends religious service: Not on file    Active member of club or organization: Not on file    Attends meetings of clubs or organizations: Not on file    Relationship status: Not on file  . Intimate partner violence    Fear of current or ex partner: Not on file    Emotionally abused: Not on file    Physically abused: Not on file    Forced sexual activity: Not on file  Other Topics Concern  . Not on file  Social History Narrative  . Not on file    Past Surgical History:  Procedure Laterality Date  . ACHILLES TENDON SURGERY Right 05/26/2016   Procedure: RIGHT ACHILLES TENDON REPAIR,CALCANEUS SPUR EXCISION;  Surgeon: Ninetta Lights, MD;  Location: [place];  Service: Orthopedics;  Laterality: Right;  . CARDIAC CATHETERIZATION  10-17-2005   DR COOPER   NON-OBSTRUCTIVE CAD/   mLAD 30-40%/   NORMAL LVF/  EF 65%  . CARDIOVASCULAR STRESS TEST  04-12-2011  DR Carleene Overlie TAYLOR   LOW RISK STUDY/  NO ISCHEMIA/  EF 68%  . CATARACT EXTRACTION W/ INTRAOCULAR LENS IMPLANT Right 2003  . COLONOSCOPY W/ POLYPECTOMY  03-17-2011  . CYSTOSCOPY WITH URETEROSCOPY Right 06/14/2013   Procedure: RIGHT  URETEROSCOPY,ureteral and urethral dilitation;  Surgeon: Claybon Jabs, MD;  Location: [facility];  Service: Urology;  Laterality: Right;  . HEEL SPUR RESECTION Right 05/26/2016   Procedure: HEEL SPUR EXCISION;  Surgeon: Ninetta Lights, MD;  Location: [facility];  Service: Orthopedics;  Laterality: Right;  . HEEL SPUR SURGERY Left 2003  . TOTAL ABDOMINAL HYSTERECTOMY W/ BILATERAL SALPINGOOPHORECTOMY  1978  . TRANSTHORACIC ECHOCARDIOGRAM  04-07-2011   MILD LVH/  MILD FOCAL BASAL SEPTAL HYPERTROPHY/  EF  55-60% /  GRADE I DIASTOLIC DYSFUNCTION    Family History  Problem Relation Age of Onset  . Cancer Mother        breast  . Cancer Father        colon  . Cancer Brother        ?lung  . Breast cancer Neg Hx      Allergies  Allergen Reactions  . Darvocet [Propoxyphene N-Acetaminophen] Nausea And Vomiting  . Tetanus Toxoids     TDAP hives, pt reports she had TD in the past w/ no issues    Current Outpatient Medications on File Prior to Visit  Medication Sig Dispense Refill  . amLODipine (NORVASC) 10 MG tablet Take 1 tablet by mouth every day 30 tablet 2  . atorvastatin (LIPITOR) 20 MG tablet Take 1 tablet by mouth every evening 30 tablet 2  . carvedilol (COREG) 6.25 MG tablet Take 1 tablet  by mouth twice daily 60 tablet 2  . JANUVIA 100 MG tablet Take 1 tablet by mouth every day 30 tablet 2   No current facility-administered medications on file prior to visit.     BP 130/84   Pulse 64   Temp 97.8 F (36.6 C) (Temporal)   Ht 5\' 5"  (1.651 m)   Wt 200 lb (90.7 kg)   SpO2 98%   BMI 33.28 kg/m  '  Objective:   Physical Exam  Constitutional: She appears well-nourished.  Neck: Neck supple.  Cardiovascular: Normal rate and regular rhythm.  Respiratory: Effort normal and breath sounds normal.  Musculoskeletal:     Comments: No obvious swelling to first metacarpal joints, DIP joints. No erythema. Decrease in ROM with stiffness and discomfort.   Skin: Skin is warm and dry.  Psychiatric: She has a normal mood and affect.           Assessment & Plan:

## 2019-02-19 NOTE — Patient Instructions (Addendum)
Stop by the front desk and speak with either Rosaria Ferries or Charmaine regarding your referral to cardiology.  Stop by the lab prior to leaving today. I will notify you of your results once received.   It was a pleasure to see you today!

## 2019-02-20 LAB — CYCLIC CITRUL PEPTIDE ANTIBODY, IGG: Cyclic Citrullin Peptide Ab: <16 UNITS

## 2019-02-22 ENCOUNTER — Ambulatory Visit: Payer: Medicare Other | Admitting: Cardiovascular Disease

## 2019-02-22 NOTE — Progress Notes (Deleted)
Cardiology Office Note   Date:  02/22/2019   ID:  Brenda Flores, Brenda Flores 1945/10/11, MRN [id number]  PCP:  Pleas Koch, NP  Cardiologist:  Kathlyn Sacramento, MD   No chief complaint on file.     History of Present Illness: Brenda Flores is a 73 y.o. female who was referred by Alma Friendly for evaluation of chest pain. She has history of nonobstructive coronary artery disease by cardiac cath in 2007, essential hypertension, hyperlipidemia and diabetes mellitus. Most recent nuclear stress test in 2016 was normal.  Past Medical History:  Diagnosis Date  . Achilles rupture, right   . Bronchitis   . CAD (coronary artery disease) Dr Burt Knack   non-obs CAD by cath in 7/07 (mLAD 30-40%, EF 65%)  . DDD (degenerative disc disease), lumbar   . DJD (degenerative joint disease)    KNEES  . GERD (gastroesophageal reflux disease)   . History of mitral valve prolapse    PER PT REPORTED MVP  PER DR HARSHAW FROM CARDIAC CATH IN 1987 (APPROX)  . 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 Hyperlipidemia   . Hypertension   . Left nephrolithiasis    NON-OBSTRUCTIVE  . Liver, polycystic   . Lumbar radiculopathy   . Osteoporosis   . PONV (postoperative nausea and vomiting)   . Right ureteral stone   . Type 2 diabetes mellitus ([place])     Past Surgical History:  Procedure Laterality Date  . ACHILLES TENDON SURGERY Right 05/26/2016   Procedure: RIGHT ACHILLES TENDON REPAIR,CALCANEUS SPUR EXCISION;  Surgeon: Ninetta Lights, MD;  Location: [place];  Service: Orthopedics;  Laterality: Right;  . CARDIAC CATHETERIZATION  10-17-2005   DR COOPER   NON-OBSTRUCTIVE CAD/   mLAD 30-40%/   NORMAL LVF/  EF 65%  . CARDIOVASCULAR STRESS TEST  04-12-2011  DR Carleene Overlie TAYLOR   LOW RISK STUDY/  NO ISCHEMIA/  EF 68%  . CATARACT EXTRACTION W/ INTRAOCULAR LENS IMPLANT Right 2003  . COLONOSCOPY W/ POLYPECTOMY  03-17-2011  .  CYSTOSCOPY WITH URETEROSCOPY Right 06/14/2013   Procedure: RIGHT  URETEROSCOPY,ureteral and urethral dilitation;  Surgeon: Claybon Jabs, MD;  Location: [facility];  Service: Urology;  Laterality: Right;  . HEEL SPUR RESECTION Right 05/26/2016   Procedure: HEEL SPUR EXCISION;  Surgeon: Ninetta Lights, MD;  Location: [place];  Service: Orthopedics;  Laterality: Right;  . HEEL SPUR SURGERY Left 2003  . TOTAL ABDOMINAL HYSTERECTOMY W/ BILATERAL SALPINGOOPHORECTOMY  1978  . TRANSTHORACIC ECHOCARDIOGRAM  04-07-2011   MILD LVH/  MILD FOCAL BASAL SEPTAL HYPERTROPHY/  EF  55-60% /  GRADE I DIASTOLIC DYSFUNCTION     Current Outpatient Medications  Medication Sig Dispense Refill  . amLODipine (NORVASC) 10 MG tablet Take 1 tablet by mouth every day 30 tablet 2  . atorvastatin (LIPITOR) 20 MG tablet Take 1 tablet by mouth every evening 30 tablet 2  . carvedilol (COREG) 6.25 MG tablet Take 1 tablet by mouth twice daily 60 tablet 2  . JANUVIA 100 MG tablet Take 1 tablet by mouth every day 30 tablet 2   No current facility-administered medications for this visit.     Allergies:   Darvocet [propoxyphene n-acetaminophen] and Tetanus toxoids    Social History:  The patient  reports that she has never smoked. She has never used smokeless tobacco. She reports that she does not drink alcohol or  use drugs.   Family History:  The patient's ***family history includes Cancer in her brother, father, and mother.    ROS:  Please see the history of present illness.   Otherwise, review of systems are positive for {NONE DEFAULTED:18576::"none"}.   All other systems are reviewed and negative.    PHYSICAL EXAM: VS:  There were no vitals taken for this visit. , BMI There is no height or weight on file to calculate BMI. GEN: Well nourished, well developed, in no acute distress  HEENT: normal  Neck: no JVD, carotid bruits, or masses Cardiac: ***RRR; no murmurs, rubs, or gallops,no  edema  Respiratory:  clear to auscultation bilaterally, normal work of breathing GI: soft, nontender, nondistended, + BS MS: no deformity or atrophy  Skin: warm and dry, no rash Neuro:  Strength and sensation are intact Psych: euthymic mood, full affect   EKG:  EKG {ACTION; IS/IS [id number] ordered today. The ekg ordered today demonstrates ***   Recent Labs: 09/26/2018: BUN 15; Creatinine, Ser 1.02; Potassium 4.0; Sodium 142 02/19/2019: ALT 12; Hemoglobin 12.4; Platelets 203.0    Lipid Panel    Component Value Date/Time   CHOL 222 (H) 02/19/2019 1149   TRIG 124.0 02/19/2019 1149   HDL 39.90 02/19/2019 1149   CHOLHDL 6 02/19/2019 1149   VLDL 24.8 02/19/2019 1149   LDLCALC 158 (H) 02/19/2019 1149   LDLDIRECT 195.0 09/26/2018 1439      Wt Readings from Last 3 Encounters:  02/19/19 200 lb (90.7 kg)  09/26/18 199 lb (90.3 kg)  07/10/18 197 lb 12 oz (89.7 kg)      Other studies Reviewed: Additional studies/ records that were reviewed today include: ***. Review of the above records demonstrates: ***  No flowsheet data found.    ASSESSMENT AND PLAN:  1.  Chest pain:  2.  Essential hypertension:  3.  Hyperlipidemia: Uncontrolled.  Recent lipid profile showed an LDL of 158.    Disposition:   FU with *** in {gen number [id number] {Days to years:10300}  Signed,  Kathlyn Sacramento, MD  02/22/2019 2:07 PM    [place]

## 2019-02-26 ENCOUNTER — Other Ambulatory Visit: Payer: Self-pay | Admitting: Primary Care

## 2019-02-26 DIAGNOSIS — E785 Hyperlipidemia, unspecified: Secondary | ICD-10-CM

## 2019-02-26 MED ORDER — ATORVASTATIN CALCIUM 20 MG PO TABS: 20.0000 mg | ORAL_TABLET | Freq: Every evening | ORAL | 3 refills | Status: DC

## 2019-02-27 ENCOUNTER — Telehealth: Payer: Self-pay | Admitting: Primary Care

## 2019-02-27 NOTE — Telephone Encounter (Signed)
Called to schedule pt for labs in 2 months per Anda Kraft. Lvm asking pt to call office.

## 2019-03-06 NOTE — Telephone Encounter (Signed)
Attempted to reach pt again. Lvm asking her to call office.

## 2019-03-26 ENCOUNTER — Ambulatory Visit (INDEPENDENT_AMBULATORY_CARE_PROVIDER_SITE_OTHER): Payer: Medicare Other | Admitting: Primary Care

## 2019-03-26 DIAGNOSIS — R059 Cough, unspecified: Secondary | ICD-10-CM

## 2019-03-26 DIAGNOSIS — R05 Cough: Secondary | ICD-10-CM | POA: Diagnosis not present

## 2019-03-26 MED ORDER — HYDROCOD POLST-CPM POLST ER 10-8 MG/5ML PO SUER: 5.0000 mL | Freq: Two times a day (BID) | ORAL | 0 refills | Status: DC | PRN

## 2019-03-26 NOTE — Assessment & Plan Note (Signed)
Acute for 3 days, also with body aches and chills initially which have resolved.  Dry cough noted during telephone visit.  She does not have fevers or any other symptoms which is reassuring, especially since other symptoms have resolved.  Do suspect viral etiology at this point, but will keep pneumonia on differentials list.  Recommend COVID-19 testing given her potential exposure.  She will update when she has had the test done.  Discussed return precautions.  Consider chest x-ray/respiratory clinic if needed.  Prescription provided for cough suppressant as requested for patient.  She has been well in the past with codeine and requests this specifically.

## 2019-03-26 NOTE — Patient Instructions (Signed)
You may take the cough suppressant every 12 hours as needed for cough and rest. Caution this medication contains codeine which may cause drowsiness.   Please call the number for Covid testing as provided.  Please notify me if you develop shortness of breath, fevers, worsening cough despite treatment.  It was a pleasure to see you today!

## 2019-03-26 NOTE — Progress Notes (Signed)
Subjective:    Patient ID: Brenda Flores, female    DOB: 03-15-1946, 74 y.o.   MRN: [id number]  HPI     Brenda Flores - 74 y.o. female  MRN [id number]  Date of Birth: [birth year]  PCP: Pleas Koch, NP  This service was provided via telemedicine. Phone Visit performed on 03/26/2019    Rationale for phone visit along with limitations reviewed. Patient consented to telephone encounter.    Location of patient: Home Location of provider: Office at [employer] @ [facility] Name of referring provider: N/A   Names of persons and role in encounter: Provider: Pleas Koch, NP  Patient: Brenda Flores  Other: N/A   Time on call: 6 min - 36 sec   Subjective: No chief complaint on file.    HPI:  Brenda Flores is a 74 year old female with a history of CAP, type 2 diabetes, heart failure, CAD, hyperlipidemia who presents today with a chief complaint of cough.  She also endorses body aches. Her symptoms began three days ago with cough followed by body aches. Her body aches resolved yesterday. Her cough is dry.  She did have chills 3 days ago.  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  She is not sure if any of the other people that have been in and out of her patient's home have been ill.    She denies fevers, loss of taste/smell, headaches, dizziness, diarrhea, shortness of breath, body aches now. She did not have a flu shot this season.  She's been taking Robitussin and Tylenol Flu and Cold without improvement.     Objective/Observations:  No physical exam or vital signs collected unless specifically identified below.   There were no vitals taken for this visit.   Respiratory status: speaks in complete sentences without evident shortness of breath.   Assessment/Plan:  No problem-specific Assessment & Plan notes found for this  encounter.   I discussed the assessment and treatment plan with the patient. The patient was provided an opportunity to ask questions and all were answered. The patient agreed with the plan and demonstrated an understanding of the instructions.  Lab Orders  No laboratory test(s) ordered today    No orders of the defined types were placed in this encounter.   The patient was advised to call back or seek an in-person evaluation if the symptoms worsen or if the condition fails to improve as anticipated.  Pleas Koch, NP    Review of Systems  Constitutional: Negative for chills, fatigue and fever.  HENT: Negative for congestion.   Respiratory: Positive for cough. Negative for shortness of breath.   Gastrointestinal: Negative for diarrhea.  Neurological: Negative for dizziness and headaches.       Past Medical History:  Diagnosis Date  . Achilles rupture, right   . Bronchitis   . CAD (coronary artery disease) Dr Burt Knack   non-obs CAD by cath in 7/07 (mLAD 30-40%, EF 65%)  . DDD (degenerative disc disease), lumbar   . DJD (degenerative joint disease)    KNEES  . GERD (gastroesophageal reflux disease)   . History of mitral valve prolapse    PER PT REPORTED MVP  PER DR HARSHAW FROM CARDIAC CATH IN 1987 (APPROX)  . 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 Hyperlipidemia   .  Hypertension   . Left nephrolithiasis    NON-OBSTRUCTIVE  . Liver, polycystic   . Lumbar radiculopathy   . Osteoporosis   . PONV (postoperative nausea and vomiting)   . Right ureteral stone   . Type 2 diabetes mellitus ([place])      Social History   Socioeconomic History  . Marital status: Widowed    Spouse name: Not on file  . Number of children: Not on file  . Years of education: Not on file  . Highest education level: Not on file  Occupational History  . Not on file  Tobacco Use  . Smoking status: Never Smoker  . Smokeless tobacco: Never  Used  Substance and Sexual Activity  . Alcohol use: No    Alcohol/week: 0.0 standard drinks  . Drug use: No  . Sexual activity: Not on file  Other Topics Concern  . Not on file  Social History Narrative  . Not on file   Social Determinants of Health   Financial Resource Strain:   . Difficulty of Paying Living Expenses: Not on file  Food Insecurity:   . Worried About Charity fundraiser in the Last Year: Not on file  . Ran Out of Food in the Last Year: Not on file  Transportation Needs:   . Lack of Transportation (Medical): Not on file  . Lack of Transportation (Non-Medical): Not on file  Physical Activity:   . Days of Exercise per Week: Not on file  . Minutes of Exercise per Session: Not on file  Stress:   . Feeling of Stress : Not on file  Social Connections:   . Frequency of Communication with Friends and Family: Not on file  . Frequency of Social Gatherings with Friends and Family: Not on file  . Attends Religious Services: Not on file  . Active Member of Clubs or Organizations: Not on file  . Attends Archivist Meetings: Not on file  . Marital Status: Not on file  Intimate Partner Violence:   . Fear of Current or Ex-Partner: Not on file  . Emotionally Abused: Not on file  . Physically Abused: Not on file  . Sexually Abused: Not on file    Past Surgical History:  Procedure Laterality Date  . ACHILLES TENDON SURGERY Right 05/26/2016   Procedure: RIGHT ACHILLES TENDON REPAIR,CALCANEUS SPUR EXCISION;  Surgeon: Ninetta Lights, MD;  Location: [facility];  Service: Orthopedics;  Laterality: Right;  . CARDIAC CATHETERIZATION  10-17-2005   DR COOPER   NON-OBSTRUCTIVE CAD/   mLAD 30-40%/   NORMAL LVF/  EF 65%  . CARDIOVASCULAR STRESS TEST  04-12-2011  DR Carleene Overlie TAYLOR   LOW RISK STUDY/  NO ISCHEMIA/  EF 68%  . CATARACT EXTRACTION W/ INTRAOCULAR LENS IMPLANT Right 2003  . COLONOSCOPY W/ POLYPECTOMY  03-17-2011  . CYSTOSCOPY WITH URETEROSCOPY Right  06/14/2013   Procedure: RIGHT  URETEROSCOPY,ureteral and urethral dilitation;  Surgeon: Claybon Jabs, MD;  Location: [facility];  Service: Urology;  Laterality: Right;  . HEEL SPUR RESECTION Right 05/26/2016   Procedure: HEEL SPUR EXCISION;  Surgeon: Ninetta Lights, MD;  Location: [facility];  Service: Orthopedics;  Laterality: Right;  . HEEL SPUR SURGERY Left 2003  . TOTAL ABDOMINAL HYSTERECTOMY W/ BILATERAL SALPINGOOPHORECTOMY  1978  . TRANSTHORACIC ECHOCARDIOGRAM  04-07-2011   MILD LVH/  MILD FOCAL BASAL SEPTAL HYPERTROPHY/  EF  55-60% /  GRADE I DIASTOLIC DYSFUNCTION    Family History  Problem Relation Age of Onset  . Cancer Mother        breast  . Cancer Father        colon  . Cancer Brother        ?lung  . Breast cancer Neg Hx     Allergies  Allergen Reactions  . Darvocet [Propoxyphene N-Acetaminophen] Nausea And Vomiting  . Tetanus Toxoids     TDAP hives, pt reports she had TD in the past w/ no issues    Current Outpatient Medications on File Prior to Visit  Medication Sig Dispense Refill  . amLODipine (NORVASC) 10 MG tablet Take 1 tablet by mouth every day 30 tablet 2  . atorvastatin (LIPITOR) 20 MG tablet Take 1 tablet (20 mg total) by mouth every evening. For cholesterol. 90 tablet 3  . carvedilol (COREG) 6.25 MG tablet Take 1 tablet by mouth twice daily 60 tablet 2  . JANUVIA 100 MG tablet Take 1 tablet by mouth every day 30 tablet 2   No current facility-administered medications on file prior to visit.    There were no vitals taken for this visit.   Objective:   Physical Exam  Constitutional: She is oriented to person, place, and time.  Respiratory: Effort normal.  Dry cough during exam  Neurological: She is alert and oriented to person, place, and time.  Psychiatric: She has a normal mood and affect.           Assessment & Plan:

## 2019-03-27 ENCOUNTER — Ambulatory Visit: Payer: PRIVATE HEALTH INSURANCE | Attending: Internal Medicine

## 2019-03-27 DIAGNOSIS — Z20822 Contact with and (suspected) exposure to covid-19: Secondary | ICD-10-CM | POA: Diagnosis not present

## 2019-03-28 ENCOUNTER — Other Ambulatory Visit: Payer: Medicare Other

## 2019-03-29 LAB — NOVEL CORONAVIRUS, NAA: SARS-CoV-2, NAA: DETECTED — AB

## 2019-04-04 ENCOUNTER — Telehealth: Payer: Self-pay | Admitting: Primary Care

## 2019-04-04 NOTE — Telephone Encounter (Signed)
Please have patient scheduled for virtual visit. Thanks!

## 2019-04-04 NOTE — Telephone Encounter (Signed)
Patient called. She stated that she has been covid positive for 10 days now and can not get rid of the cough.  She said it feels like it is settling in her chest  Patient wanted to know if prednisone could be called into her pharmacy for her   [place]

## 2019-04-08 ENCOUNTER — Encounter: Payer: Self-pay | Admitting: Family Medicine

## 2019-04-08 ENCOUNTER — Ambulatory Visit (INDEPENDENT_AMBULATORY_CARE_PROVIDER_SITE_OTHER): Payer: Medicare Other | Admitting: Family Medicine

## 2019-04-08 VITALS — Ht 65.0 in | Wt 198.0 lb

## 2019-04-08 DIAGNOSIS — R05 Cough: Secondary | ICD-10-CM | POA: Diagnosis not present

## 2019-04-08 DIAGNOSIS — R059 Cough, unspecified: Secondary | ICD-10-CM

## 2019-04-08 MED ORDER — HYDROCOD POLST-CPM POLST ER 10-8 MG/5ML PO SUER: 5.0000 mL | Freq: Two times a day (BID) | ORAL | 0 refills | Status: DC | PRN

## 2019-04-08 MED ORDER — ALBUTEROL SULFATE (2.5 MG/3ML) 0.083% IN NEBU: 2.5000 mg | INHALATION_SOLUTION | Freq: Four times a day (QID) | RESPIRATORY_TRACT | 1 refills | Status: DC | PRN

## 2019-04-08 NOTE — Progress Notes (Signed)
Interactive audio and video telecommunications were attempted between this provider and patient, however failed, due to patient having technical difficulties OR patient did not have access to video capability.  We continued and completed visit with audio only.   Virtual Visit via Telephone Note  I connected with patient on 04/08/19  at 12:33 PM  by telephone and verified that I am speaking with the correct person using two identifiers.  Location of patient: home.    Location of MD: [facility] Name of referring provider (if blank then none associated): Names per persons and role in encounter:  MD: Earlyne Iba, Patient: name listed above.    I discussed the limitations, risks, security and privacy concerns of performing an evaluation and management service by telephone and the availability of in person appointments. I also discussed with the patient that there may be a patient responsible charge related to this service. The patient expressed understanding and agreed to proceed.  CC: cough  HPI: recent covid positive, d/w pt.  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    She has been out of cough syrup for x 2 days. Denies sob, fever,body aches, chills.   She has fatigue.  She lives alone, is safe at home.  She can still manage at home.  She is still eating and drinking well.    Her cough medicine helped.  No wheeze.  She has SABA neb to use, but hasn't used recently.  She needs refill on SABA for neb.    She has loss of taste and smell.     Observations/Objective: nad Speech wnl, speaking in complete sentences but cough noted.    Assessment and Plan:  Covid, with cough noted.  Not SOB and not needing ER eval.  Discussed options, start SABA via neb and restart hycodan.  Rest and fluids and supportive care o/w with f/u cautions d/w pt.  She agrees.    Follow Up  Instructions: see above.    I discussed the assessment and treatment plan with the patient. The patient was provided an opportunity to ask questions and all were answered. The patient agreed with the plan and demonstrated an understanding of the instructions.   The patient was advised to call back or seek an in-person evaluation if the symptoms worsen or if the condition fails to improve as anticipated.  I provided 14 minutes of non-face-to-face time during this encounter.  Elsie Stain, MD

## 2019-04-08 NOTE — Assessment & Plan Note (Signed)
Due to Covid, with cough noted.  Not SOB and not needing ER eval.  Discussed options, start SABA via neb and restart hycodan.  Rest and fluids and supportive care o/w with f/u cautions d/w pt.  She agrees.

## 2019-04-10 ENCOUNTER — Other Ambulatory Visit: Payer: Self-pay | Admitting: Primary Care

## 2019-04-10 DIAGNOSIS — E119 Type 2 diabetes mellitus without complications: Secondary | ICD-10-CM

## 2019-04-10 DIAGNOSIS — E785 Hyperlipidemia, unspecified: Secondary | ICD-10-CM

## 2019-04-10 DIAGNOSIS — I10 Essential (primary) hypertension: Secondary | ICD-10-CM

## 2019-04-17 ENCOUNTER — Telehealth: Payer: Self-pay | Admitting: *Deleted

## 2019-04-17 NOTE — Telephone Encounter (Signed)
Noted and agree. 

## 2019-04-17 NOTE — Telephone Encounter (Signed)
Patient called requesting an antibiotic. Patient stated that she tested positive for covid about 3 weeks ago. Patient stated that she has a cough/clear, some SOB, chills and tightness in her chest. Patient stated that she thinks that she may have pneumonia. Advised patient that she needs to be seen and probably a chest x-ray done.  Offered to schedule patient an appointment tonight at the [facility]. Patient declined the appointment stating that she has to go to [place] to pick her daughter up. Offered to schedule the patient an appointment tomorrow night which she declined stating that she would rather go to an Urgent Care tomorrow. Patient was given information on [facility] at [facility]. Patient stated that she will plan on going to the Urgent Care tomorrow. ER precautions were given to patient and she verbalized understanding.

## 2019-04-19 ENCOUNTER — Ambulatory Visit (INDEPENDENT_AMBULATORY_CARE_PROVIDER_SITE_OTHER): Payer: Medicare Other

## 2019-04-19 ENCOUNTER — Encounter (HOSPITAL_COMMUNITY): Payer: Self-pay

## 2019-04-19 ENCOUNTER — Other Ambulatory Visit: Payer: Self-pay

## 2019-04-19 ENCOUNTER — Ambulatory Visit (HOSPITAL_COMMUNITY)
Admission: EM | Admit: 2019-04-19 | Discharge: 2019-04-19 | Disposition: A | Discharge status: Home / Self Care | Payer: Medicare Other | Attending: Urgent Care | Admitting: Urgent Care

## 2019-04-19 DIAGNOSIS — R05 Cough: Secondary | ICD-10-CM | POA: Diagnosis not present

## 2019-04-19 DIAGNOSIS — R0602 Shortness of breath: Secondary | ICD-10-CM | POA: Diagnosis not present

## 2019-04-19 DIAGNOSIS — J189 Pneumonia, unspecified organism: Secondary | ICD-10-CM

## 2019-04-19 DIAGNOSIS — Z8616 Personal history of COVID-19: Secondary | ICD-10-CM

## 2019-04-19 DIAGNOSIS — R059 Cough, unspecified: Secondary | ICD-10-CM

## 2019-04-19 MED ORDER — AZITHROMYCIN 250 MG PO TABS: 250.0000 mg | ORAL_TABLET | Freq: Every day | ORAL | 0 refills | Status: DC

## 2019-04-19 MED ORDER — BENZONATATE 100 MG PO CAPS: 100.0000 mg | ORAL_CAPSULE | Freq: Three times a day (TID) | ORAL | 0 refills | Status: DC | PRN

## 2019-04-19 MED ORDER — CEFDINIR 300 MG PO CAPS: 300.0000 mg | ORAL_CAPSULE | Freq: Two times a day (BID) | ORAL | 0 refills | Status: DC

## 2019-04-19 MED ORDER — PROMETHAZINE-DM 6.25-15 MG/5ML PO SYRP: 5.0000 mL | ORAL_SOLUTION | Freq: Every evening | ORAL | 0 refills | Status: DC | PRN

## 2019-04-19 NOTE — Discharge Instructions (Addendum)
Please make sure that you follow-up with your PCP as soon as possible.  For now I will start you on 2 different types of antibiotics to help you with pneumonia of the middle right side of your lung.  Continue to use the albuterol inhaler and the cough medication that was prescribed to by your PCP.  If you develop worsening symptoms of shortness of breath or start to have chest pain and high fevers then please report to the emergency room.

## 2019-04-19 NOTE — ED Triage Notes (Signed)
Patient presents to Urgent Care with complaints of chest tightness causing shortness of breath since she was diagnosed w/ covid three weeks ago. Patient reports her PCP wants her to get a chest x-ray to make sure she does not have pneumonia, pt states SOB is worse when laying flat.

## 2019-04-19 NOTE — ED Provider Notes (Signed)
[facility] [place]   MRN: [id number] DOB: Aug 10, 1945  Subjective:   Brenda Flores is a 74 y.o. female presenting for 3-week history of persistent shortness of breath, chest tightness and dry hacking cough.  Patient was diagnosed with COVID-19 3 weeks ago.  She has been prescribed albuterol and Tussionex.  She was working with her PCP on getting an appointment but patient was unable to keep that.  She was advised to report to our urgent care for an x-ray.  Today, she denies fever, chest pain, wheezing, hemoptysis.  Denies history of chronic lung disease including asthma or COPD.  Patient is not a smoker.  Denies history of PE.  Patient does have a history of diabetes, hypertension, hyperlipidemia.  She reports that she is very compliant with her medications.  No current facility-administered medications for this encounter.  Current Outpatient Medications:  .  albuterol (PROVENTIL) (2.5 MG/3ML) 0.083% nebulizer solution, Take 3 mLs (2.5 mg total) by nebulization every 6 (six) hours as needed for wheezing or shortness of breath., Disp: 150 mL, Rfl: 1 .  amLODipine (NORVASC) 10 MG tablet, Take 1 tablet by mouth every day, Disp: 30 tablet, Rfl: 5 .  atorvastatin (LIPITOR) 20 MG tablet, Take 1 tablet (20 mg total) by mouth every evening. DUE FOR A LAB APPOINTMENT FOR RECHECK, Disp: 30 tablet, Rfl: 0 .  carvedilol (COREG) 6.25 MG tablet, Take 1 tablet by mouth twice daily, Disp: 60 tablet, Rfl: 5 .  chlorpheniramine-HYDROcodone (TUSSIONEX PENNKINETIC ER) 10-8 MG/5ML SUER, Take 5 mLs by mouth every 12 (twelve) hours as needed for cough., Disp: 100 mL, Rfl: 0 .  JANUVIA 100 MG tablet, Take 1 tablet by mouth every day, Disp: 30 tablet, Rfl: 5   Allergies  Allergen Reactions  . Darvocet [Propoxyphene N-Acetaminophen] Nausea And Vomiting  . Tetanus Toxoids     TDAP hives, pt reports she had TD in the past w/ no issues    Past Medical History:  Diagnosis Date  . Achilles rupture, right     . Bronchitis   . CAD (coronary artery disease) Dr Burt Knack   non-obs CAD by cath in 7/07 (mLAD 30-40%, EF 65%)  . DDD (degenerative disc disease), lumbar   . DJD (degenerative joint disease)    KNEES  . GERD (gastroesophageal reflux disease)   . History of mitral valve prolapse    PER PT REPORTED MVP  PER DR HARSHAW FROM CARDIAC CATH IN 1987 (APPROX)  . 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 Hyperlipidemia   . Hypertension   . Left nephrolithiasis    NON-OBSTRUCTIVE  . Liver, polycystic   . Lumbar radiculopathy   . Osteoporosis   . PONV (postoperative nausea and vomiting)   . Right ureteral stone   . Type 2 diabetes mellitus ([place])      Past Surgical History:  Procedure Laterality Date  . ACHILLES TENDON SURGERY Right 05/26/2016   Procedure: RIGHT ACHILLES TENDON REPAIR,CALCANEUS SPUR EXCISION;  Surgeon: Ninetta Lights, MD;  Location: [facility];  Service: Orthopedics;  Laterality: Right;  . CARDIAC CATHETERIZATION  10-17-2005   DR COOPER   NON-OBSTRUCTIVE CAD/   mLAD 30-40%/   NORMAL LVF/  EF 65%  . CARDIOVASCULAR STRESS TEST  04-12-2011  DR Carleene Overlie TAYLOR   LOW RISK STUDY/  NO ISCHEMIA/  EF 68%  . CATARACT EXTRACTION W/ INTRAOCULAR LENS IMPLANT Right 2003  . COLONOSCOPY W/ POLYPECTOMY  03-17-2011  .  CYSTOSCOPY WITH URETEROSCOPY Right 06/14/2013   Procedure: RIGHT  URETEROSCOPY,ureteral and urethral dilitation;  Surgeon: Claybon Jabs, MD;  Location: [facility];  Service: Urology;  Laterality: Right;  . HEEL SPUR RESECTION Right 05/26/2016   Procedure: HEEL SPUR EXCISION;  Surgeon: Ninetta Lights, MD;  Location: [facility];  Service: Orthopedics;  Laterality: Right;  . HEEL SPUR SURGERY Left 2003  . TOTAL ABDOMINAL HYSTERECTOMY W/ BILATERAL SALPINGOOPHORECTOMY  1978  . TRANSTHORACIC ECHOCARDIOGRAM  04-07-2011   MILD LVH/  MILD FOCAL BASAL SEPTAL HYPERTROPHY/  EF  55-60% /  GRADE I  DIASTOLIC DYSFUNCTION    Family History  Problem Relation Age of Onset  . Cancer Mother        breast  . Cancer Father        colon  . Cancer Brother        ?lung  . Breast cancer Neg Hx     Social History   Tobacco Use  . Smoking status: Never Smoker  . Smokeless tobacco: Never Used  Substance Use Topics  . Alcohol use: No    Alcohol/week: 0.0 standard drinks  . Drug use: No    ROS   Objective:   Vitals: BP 138/87 (BP Location: Right Arm)   Pulse 70   Temp 98.3 F (36.8 C) (Oral)   Resp 20   SpO2 96%   Physical Exam Constitutional:      General: She is not in acute distress.    Appearance: Normal appearance. She is well-developed. She is not ill-appearing, toxic-appearing or diaphoretic.  HENT:     Head: Normocephalic and atraumatic.     Nose: Nose normal.     Mouth/Throat:     Mouth: Mucous membranes are moist.  Eyes:     Extraocular Movements: Extraocular movements intact.     Pupils: Pupils are equal, round, and reactive to light.  Cardiovascular:     Rate and Rhythm: Normal rate and regular rhythm.     Pulses: Normal pulses.     Heart sounds: Normal heart sounds. No murmur. No friction rub. No gallop.   Pulmonary:     Effort: Pulmonary effort is normal. No respiratory distress.     Breath sounds: No stridor. Decreased breath sounds (mildly decreased in bibasilar fields) present. No wheezing, rhonchi or rales.  Musculoskeletal:     Right lower leg: No tenderness. No edema.     Left lower leg: No tenderness. No edema.  Skin:    General: Skin is warm and dry.     Findings: No rash.  Neurological:     Mental Status: She is alert and oriented to person, place, and time.  Psychiatric:        Mood and Affect: Mood normal.        Behavior: Behavior normal.        Thought Content: Thought content normal.     DG Chest 2 View  Result Date: 04/19/2019 CLINICAL DATA:  Cough.  Shortness of breath. EXAM: CHEST - 2 VIEW COMPARISON:  CT 09/23/2016.  Chest  x-ray 09/23/2016. FINDINGS: Mediastinum and hilar structures normal. Cardiomegaly with normal pulmonary vascularity. Mild right base atelectasis/infiltrate cannot be excluded. No pleural effusion or pneumothorax. Degenerative change and scoliosis thoracic spine. Tiny metallic density noted over the right upper chest soft tissues. IMPRESSION: Mild right middle base atelectasis/infiltrate cannot be excluded. Electronically Signed   By: Marcello Moores  Register   On: 04/19/2019 15:00    Assessment and Plan :  1. Pneumonia of right middle lobe due to infectious organism   2. Shortness of breath   3. Cough   4. History of COVID-19     Will cover for pneumonia with azithromycin and cefdinir as per up-to-date.  Recommended patient reach out to her PCP for refills on Tussionex.  Otherwise I will prescribe promethazine cough syrup and Tessalon Perls.  Strict ER precautions.  Otherwise follow-up with PCP as soon as possible. Counseled patient on potential for adverse effects with medications prescribed today, patient verbalized understanding.    Jaynee Eagles, [place] 04/19/19 1531

## 2019-05-13 ENCOUNTER — Ambulatory Visit: Payer: Medicare Other

## 2019-05-25 ENCOUNTER — Ambulatory Visit: Payer: Medicare Other | Attending: Internal Medicine

## 2019-05-25 DIAGNOSIS — Z23 Encounter for immunization: Secondary | ICD-10-CM

## 2019-05-25 NOTE — Progress Notes (Signed)
   Covid-19 Vaccination Clinic  Name:  Brenda Flores    MRN: [id number] DOB: June 09, 1945  05/25/2019  [name] was observed post Covid-19 immunization for 15 minutes without incident. She was provided with Vaccine Information Sheet and instruction to access the V-Safe system.   [name] was instructed to call 911 with any severe reactions post vaccine: [name] Difficulty breathing  . Swelling of face and throat  . A fast heartbeat  . A bad rash all over body  . Dizziness and weakness   Immunizations Administered    Name Date Dose VIS Date Route   Pfizer COVID-19 Vaccine 05/25/2019  9:09 AM 0.3 mL 03/01/2019 Intramuscular   Manufacturer: [place]   Lot: [id number]   [place]: [id number]

## 2019-06-11 ENCOUNTER — Telehealth: Payer: Self-pay | Admitting: Primary Care

## 2019-06-11 DIAGNOSIS — H2511 Age-related nuclear cataract, right eye: Secondary | ICD-10-CM | POA: Diagnosis not present

## 2019-06-11 DIAGNOSIS — Z9842 Cataract extraction status, left eye: Secondary | ICD-10-CM | POA: Diagnosis not present

## 2019-06-11 DIAGNOSIS — E119 Type 2 diabetes mellitus without complications: Secondary | ICD-10-CM | POA: Diagnosis not present

## 2019-06-11 DIAGNOSIS — H524 Presbyopia: Secondary | ICD-10-CM | POA: Diagnosis not present

## 2019-06-11 LAB — HM DIABETES EYE EXAM

## 2019-06-11 NOTE — Telephone Encounter (Signed)
Pt has Medicare and AARP supplement.  [facility] states they don't have a referral.  No referral in Epic.  Their office will need pt to fax [place] release.  They will have pt sign ROI after her exam.

## 2019-06-11 NOTE — Telephone Encounter (Signed)
Hanna @ [facility] Received referral but needs office note Please fax to [phone] Pt is there now

## 2019-06-14 DIAGNOSIS — H25041 Posterior subcapsular polar age-related cataract, right eye: Secondary | ICD-10-CM | POA: Diagnosis not present

## 2019-06-14 DIAGNOSIS — H18413 Arcus senilis, bilateral: Secondary | ICD-10-CM | POA: Diagnosis not present

## 2019-06-14 DIAGNOSIS — Z961 Presence of intraocular lens: Secondary | ICD-10-CM | POA: Diagnosis not present

## 2019-06-14 DIAGNOSIS — H40013 Open angle with borderline findings, low risk, bilateral: Secondary | ICD-10-CM | POA: Diagnosis not present

## 2019-06-14 DIAGNOSIS — H25011 Cortical age-related cataract, right eye: Secondary | ICD-10-CM | POA: Diagnosis not present

## 2019-06-14 DIAGNOSIS — H2511 Age-related nuclear cataract, right eye: Secondary | ICD-10-CM | POA: Diagnosis not present

## 2019-06-15 ENCOUNTER — Ambulatory Visit: Payer: Medicare Other | Attending: Internal Medicine

## 2019-06-15 DIAGNOSIS — Z23 Encounter for immunization: Secondary | ICD-10-CM

## 2019-06-15 NOTE — Progress Notes (Signed)
   Covid-19 Vaccination Clinic  Name:  Brenda Flores    MRN: [id number] DOB: 1945/07/05  06/15/2019  [name] was observed post Covid-19 immunization for 15 minutes without incident. She was provided with Vaccine Information Sheet and instruction to access the V-Safe system.   [name] was instructed to call 911 with any severe reactions post vaccine: [name] Difficulty breathing  . Swelling of face and throat  . A fast heartbeat  . A bad rash all over body  . Dizziness and weakness   Immunizations Administered    Name Date Dose VIS Date Route   Pfizer COVID-19 Vaccine 06/15/2019  9:36 AM 0.3 mL 03/01/2019 Intramuscular   Manufacturer: [place]   Lot: [id number]   [place]: [id number]

## 2019-06-21 DIAGNOSIS — H2511 Age-related nuclear cataract, right eye: Secondary | ICD-10-CM | POA: Diagnosis not present

## 2019-06-21 DIAGNOSIS — Z961 Presence of intraocular lens: Secondary | ICD-10-CM | POA: Diagnosis not present

## 2019-08-02 IMAGING — DX LEFT FOOT - COMPLETE 3+ VIEW
3 series · 3 of 3 positions shown · non-contrast
Comparison: None.

CLINICAL DATA: Dorsal left foot pain for several months, history of
previous fall several months ago, initial encounter

EXAM:
LEFT FOOT - COMPLETE 3+ VIEW

[foot ap]
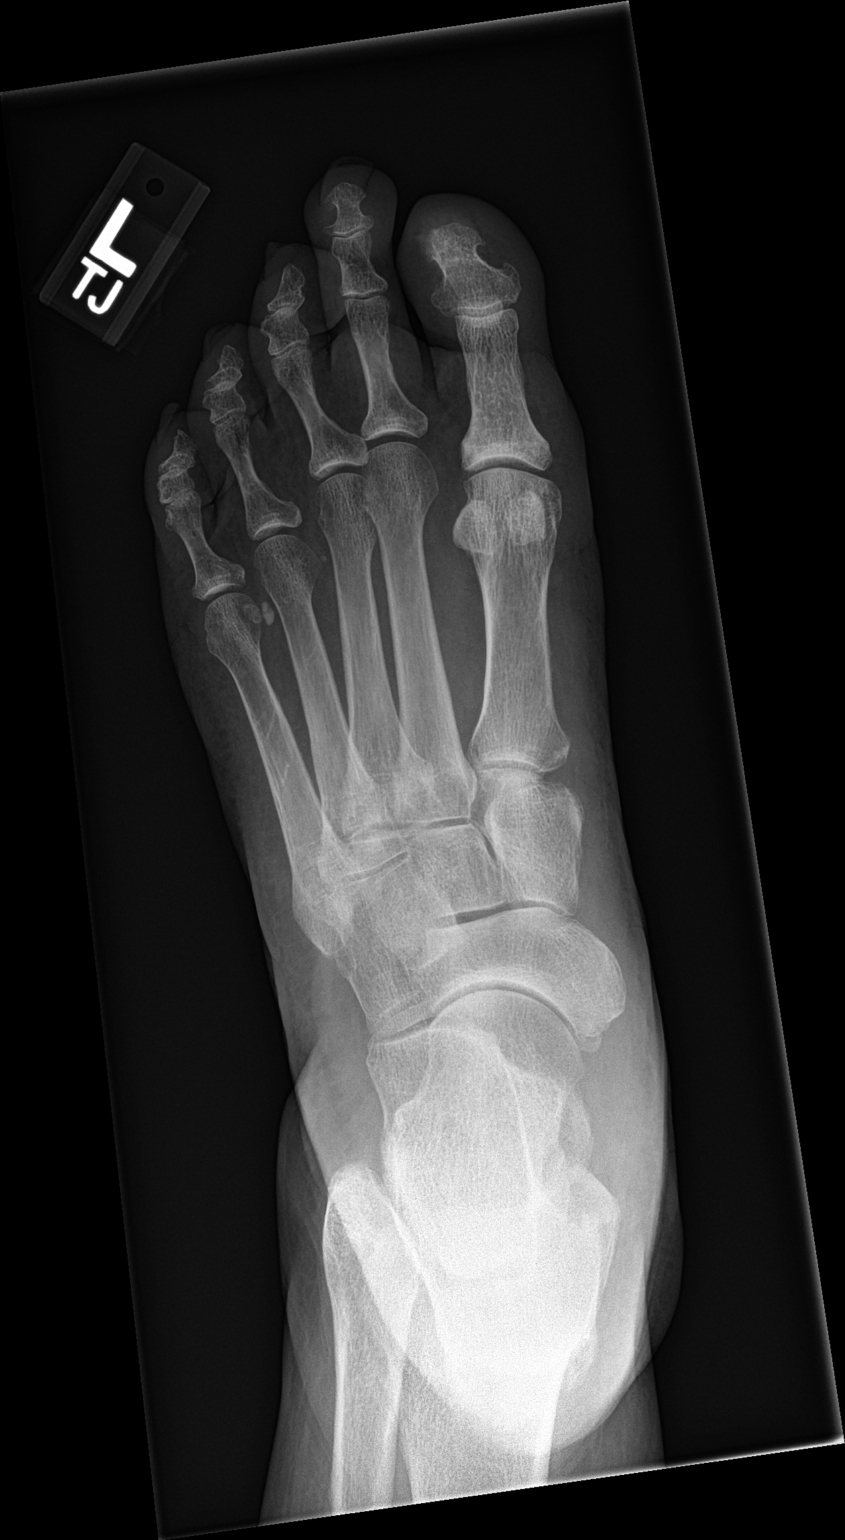

[foot obl]
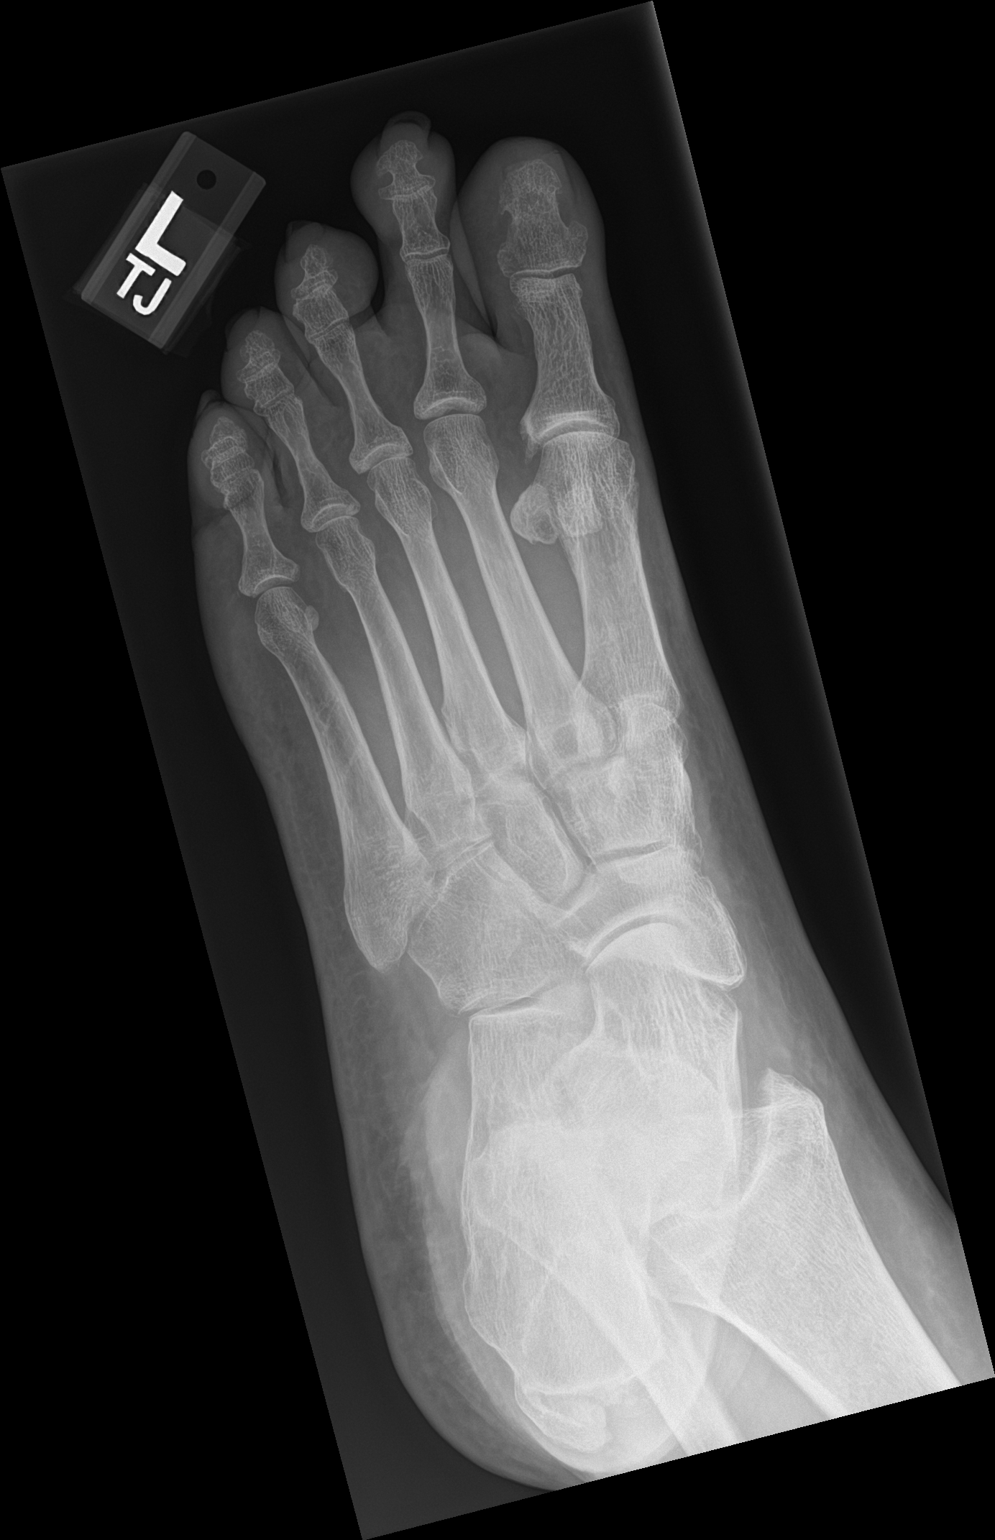

[foot lat]
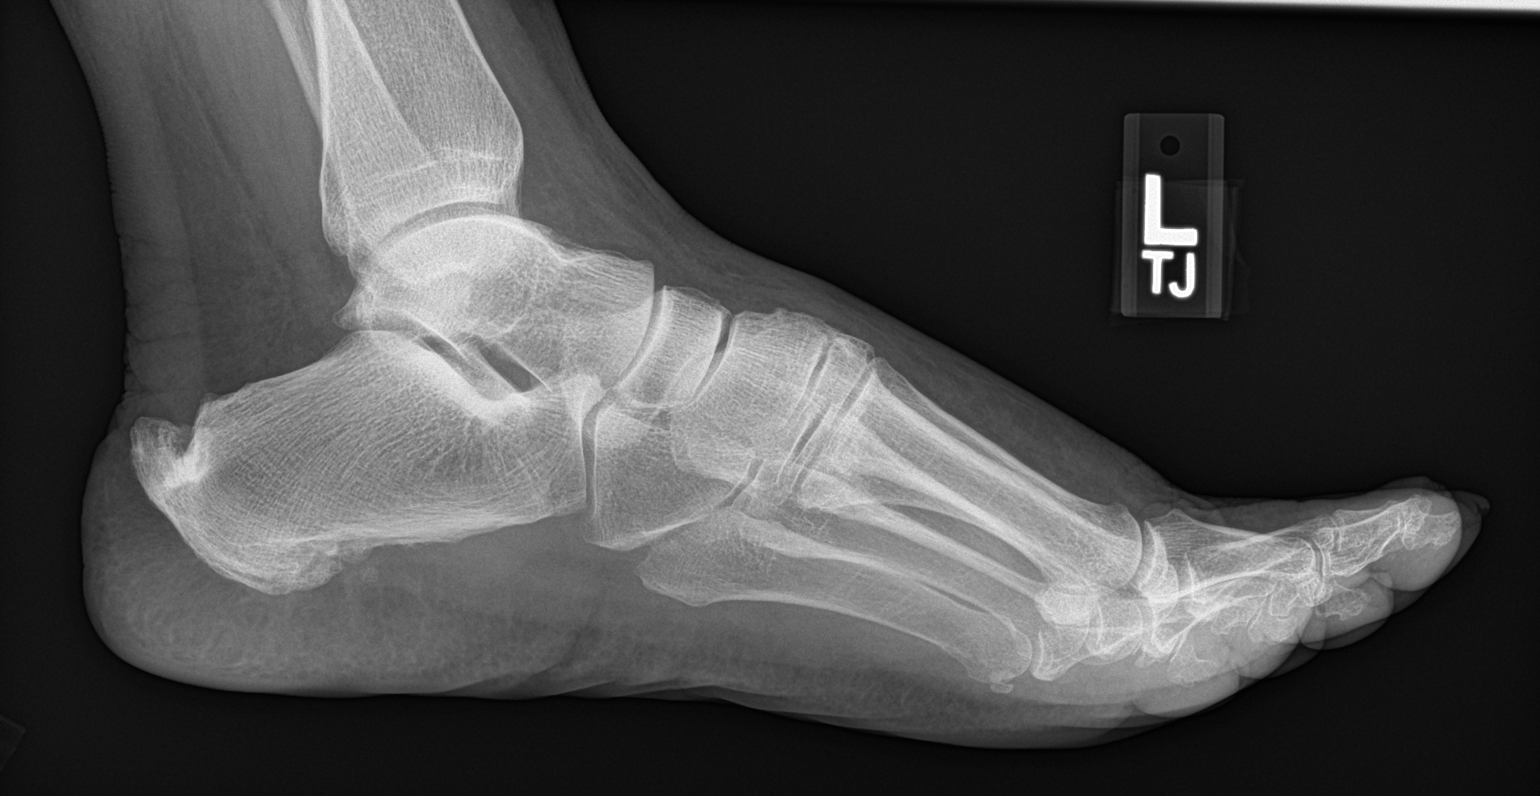

[3 of 3 positions shown; findings below may reference images not displayed]

FINDINGS: Calcaneal spurring is noted. No acute fracture or dislocation is
seen. No soft tissue abnormality is noted.
IMPRESSION: Mild degenerative change without acute abnormality.

## 2019-08-22 ENCOUNTER — Telehealth: Payer: Self-pay

## 2019-08-22 NOTE — Telephone Encounter (Signed)
Noted. We can see her tomorrow. If we will up then add a 12 pm on.

## 2019-08-22 NOTE — Telephone Encounter (Signed)
Unable to reach pt or Tiffany by phone; per DPR left v/m requesting pt to call [facility].

## 2019-08-22 NOTE — Telephone Encounter (Signed)
Left message for patient to return call. It shows that patient made an appointment for Monday 08/26/2019

## 2019-08-22 NOTE — Telephone Encounter (Signed)
[name] - Client TELEPHONE ADVICE RECORD Access[facility]urse Patient Name: Brenda Flores [facility] Gender: Female DOB: 1946/02/25 Age: 74 Y 59 M 22 D Return Phone Number: [phone] (Primary) Address[place] [address] Client [facility] - Client Client Site [place] - Day Physician Alma Friendly - NP Contact Type Call Who Is Calling Patient / Member / Family / Caregiver Call Type Triage / Clinical Relationship To Patient Self Return Phone Number [phone] (Primary) Chief Complaint Flank Pain Reason for Call Symptomatic / Request for [name] has left side pain. Her foot and her leg have been swollen for about two weeks. Translation No Nurse Assessment Nurse: Joya Gaskins, RN, Vonna Kotyk Date/Time (Eastern Time): 08/21/2019 3:43:03 PM Confirm and document reason for call. If symptomatic, describe symptoms. ---Caller states that she has left side pain for a week. Her foot and her leg have been swollen for about two weeks. Has the patient had close contact with a person known or suspected to have the novel coronavirus illness OR traveled / lives in area with major community spread (including international travel) in the last 14 days from the onset of symptoms? * If Asymptomatic, screen for exposure and travel within the last 14 days. ---No Does the patient have any new or worsening symptoms? ---Yes Will a triage be completed? ---Yes Related visit to physician within the last 2 weeks? ---No Does the PT have any chronic conditions? (i.e. diabetes, asthma, this includes High risk factors for pregnancy, etc.) ---No Is this a behavioral health or substance abuse call? ---No Guidelines Guideline Title Affirmed Question Affirmed Notes Nurse Date/Time Eilene Ghazi Time) Leg Swelling and Edema [1] Thigh, calf, or ankle swelling AND [2] only 1 side Rachel Moulds 08/21/2019 3:43:55 PM Disp. Time Eilene Ghazi Time) Disposition Final User 08/21/2019 3:45:19 PM See HCP within 4 Hours (or PCP triage) Yes Joya Gaskins, RN, Vonna Kotyk PLEASE NOTE: All timestamps contained within this report are represented as [place] Standard Time. CONFIDENTIALTY NOTICE: This fax transmission is intended only for the addressee. It contains information that is legally privileged, confidential or otherwise protected from use or disclosure. If you are not the intended recipient, you are strictly prohibited from reviewing, disclosing, copying using or disseminating any of this information or taking any action in reliance on or regarding this information. If you have received this fax in error, please notify us immediately by telephone so that we can arrange for its return to [place]. Phone: [phone], Toll-Free: [phone], Fax: [phone] Page: 2 of 2 Call Id: [id number] [place] Disagree/Comply Comply Caller Understands Yes PreDisposition Call Doctor Care Advice Given Per Guideline SEE HCP WITHIN 4 HOURS (OR PCP TRIAGE): * IF OFFICE WILL BE OPEN: You need to be seen within the next 3 or 4 hours. Call your doctor (or NP/PA) now or as soon as the office opens. CARE ADVICE given per Leg Swelling and Edema (Adult) guideline. Comments User: Ellan Lambert, RN Date/Time Eilene Ghazi Time): 08/21/2019 3:44:04 PM DM, HTN User: Ellan Lambert, RN Date/Time Eilene Ghazi Time): 08/21/2019 3:47:50 PM NO answer at the office backline; pt directed to either UC or ED within 4 hours. Referrals Warm transfer to backline

## 2019-08-23 NOTE — Telephone Encounter (Signed)
Left message for patient before I left on Thursday 08/22/2019

## 2019-08-26 ENCOUNTER — Encounter: Payer: Self-pay | Admitting: Primary Care

## 2019-08-26 ENCOUNTER — Ambulatory Visit (INDEPENDENT_AMBULATORY_CARE_PROVIDER_SITE_OTHER)
Admission: RE | Admit: 2019-08-26 | Discharge: 2019-08-26 | Disposition: A | Discharge status: Home / Self Care | Payer: Medicare Other | Source: Ambulatory Visit | Attending: Primary Care | Admitting: Primary Care

## 2019-08-26 ENCOUNTER — Ambulatory Visit (INDEPENDENT_AMBULATORY_CARE_PROVIDER_SITE_OTHER): Payer: Medicare Other | Admitting: Primary Care

## 2019-08-26 ENCOUNTER — Other Ambulatory Visit: Payer: Self-pay

## 2019-08-26 VITALS — BP 124/86 | HR 64 | Temp 98.1°F | Ht 65.0 in | Wt 201.0 lb

## 2019-08-26 DIAGNOSIS — R0600 Dyspnea, unspecified: Secondary | ICD-10-CM

## 2019-08-26 DIAGNOSIS — R5383 Other fatigue: Secondary | ICD-10-CM | POA: Insufficient documentation

## 2019-08-26 DIAGNOSIS — M545 Low back pain, unspecified: Secondary | ICD-10-CM

## 2019-08-26 DIAGNOSIS — E119 Type 2 diabetes mellitus without complications: Secondary | ICD-10-CM

## 2019-08-26 DIAGNOSIS — R600 Localized edema: Secondary | ICD-10-CM | POA: Diagnosis not present

## 2019-08-26 DIAGNOSIS — G8929 Other chronic pain: Secondary | ICD-10-CM | POA: Diagnosis not present

## 2019-08-26 DIAGNOSIS — R109 Unspecified abdominal pain: Secondary | ICD-10-CM

## 2019-08-26 DIAGNOSIS — R0609 Other forms of dyspnea: Secondary | ICD-10-CM | POA: Insufficient documentation

## 2019-08-26 LAB — POC URINALSYSI DIPSTICK (AUTOMATED)
Bilirubin, UA: 1
Blood, UA: NEGATIVE
Glucose, UA: POSITIVE — AB
Ketones, UA: 5
Leukocytes, UA: NEGATIVE
Nitrite, UA: NEGATIVE
Protein, UA: POSITIVE — AB
Spec Grav, UA: >=1.03 — AB (ref 1.010–1.025)
Urobilinogen, UA: 0.2 E.U./dL
pH, UA: 6 (ref 5.0–8.0)

## 2019-08-26 NOTE — Assessment & Plan Note (Signed)
Acute for the last 2 weeks, noted on exam bilaterally but worse on left.  Differentials include venous insufficiency, DVT, CHF. Checking labs today. Ultrasound venous ordered and pending, especially given recent travel and her sedentary occupation.  Encouraged to elevate legs regularly when resting.

## 2019-08-26 NOTE — Progress Notes (Signed)
Subjective:    Patient ID: Brenda Flores, female    DOB: Mar 19, 1946, 74 y.o.   MRN: [id number]  HPI  This visit occurred during the SARS-CoV-2 public health emergency.  Safety protocols were in place, including screening questions prior to the visit, additional usage of staff PPE, and extensive cleaning of exam room while observing appropriate contact time as indicated for disinfecting solutions.   Brenda Flores is a 74 year old female with a history of hypertension, CAD, type 2 diabetes, lumbar disc disease, hyperlipidemia, chronic foot pain who presents today with several issues. She had Covid-19 in November 2020.  1) Foot Swelling: Acute to the left lower extremity for the last two weeks, also with constant numbness to the left lower extremity. She denies redness, injury/trauma. She is sedentary during the day for her job. She flew to [place] in early May 2021. No prior history of blood clotting. She does have chronic lower back pain. Her swelling is improved when waking in the morning.   2) Chronic Back Pain/Flank Pain: Located to left lateral flank that began one week ago. She does discuss chronic bilateral lower back pain, will have to stop washing dishes to rest due to pain. She has noticed urinary frequency over the last two weeks, has to get up every 2 hours to urinate. She does not check her blood sugars.  She denies hematuria, dysuria, increased/strenuous activity. Sometimes her pain will radiated around to her left abdomen and groin. She has a history of kidney stone over 40 years ago.   She drinks half lemonade/half sweet tea nearly all day and evening, little water intake. She does take "three Tylenol" at bedtime every night which helps with back pain.   3) Fatigue: Diagnosed with Covid-19 in November 2020, since then she's noticed exertional dyspnea with mild and moderate exertion and fatigue. She denies dizziness, headaches, rectal/vaginal bleeding. She is not  sleeping well, has difficulty falling and staying asleep. During the day she feels as though she could nod off, doesn't take naps. She does snore.  She underwent a sleep study 17 years ago which was negative. She does drink mostly lemonade and sweet tea, also drinks a lot during the evening hours. She believes this her nighttime urinary frequency is contributing to her disturbed sleeping pattern.   BP Readings from Last 3 Encounters:  08/26/19 124/86  04/19/19 138/87  02/19/19 130/84     Review of Systems  Constitutional: Positive for fatigue.  Respiratory:       Exertional dyspnea.  Cardiovascular: Positive for leg swelling. Negative for chest pain.  Endocrine: Positive for polyuria.  Genitourinary: Positive for frequency.  Musculoskeletal: Positive for arthralgias and back pain.  Neurological: Positive for numbness.       Past Medical History:  Diagnosis Date  . Achilles rupture, right   . Bronchitis   . CAD (coronary artery disease) Dr Burt Knack   non-obs CAD by cath in 7/07 (mLAD 30-40%, EF 65%)  . DDD (degenerative disc disease), lumbar   . DJD (degenerative joint disease)    KNEES  . GERD (gastroesophageal reflux disease)   . History of mitral valve prolapse    PER PT REPORTED MVP  PER DR HARSHAW FROM CARDIAC CATH IN 1987 (APPROX)  . 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 Hyperlipidemia   . Hypertension   . Left nephrolithiasis    NON-OBSTRUCTIVE  . Liver, polycystic   . Lumbar  radiculopathy   . Osteoporosis   . PONV (postoperative nausea and vomiting)   . Right ureteral stone   . Type 2 diabetes mellitus ([place])      Social History   Socioeconomic History  . Marital status: Widowed    Spouse name: Not on file  . Number of children: Not on file  . Years of education: Not on file  . Highest education level: Not on file  Occupational History  . Not on file  Tobacco Use  . Smoking status: Never Smoker  .  Smokeless tobacco: Never Used  Substance and Sexual Activity  . Alcohol use: No    Alcohol/week: 0.0 standard drinks  . Drug use: No  . Sexual activity: Not on file  Other Topics Concern  . Not on file  Social History Narrative  . Not on file   Social Determinants of Health   Financial Resource Strain:   . Difficulty of Paying Living Expenses:   Food Insecurity:   . Worried About Charity fundraiser in the Last Year:   . Arboriculturist in the Last Year:   Transportation Needs:   . Film/video editor (Medical):   [name] Lack of Transportation (Non-Medical):   Physical Activity:   . Days of Exercise per Week:   . Minutes of Exercise per Session:   Stress:   . Feeling of Stress :   Social Connections:   . Frequency of Communication with Friends and Family:   . Frequency of Social Gatherings with Friends and Family:   . Attends Religious Services:   . Active Member of Clubs or Organizations:   . Attends Archivist Meetings:   [name] Marital Status:   Intimate Partner Violence:   . Fear of Current or Ex-Partner:   . Emotionally Abused:   [name] Physically Abused:   . Sexually Abused:     Past Surgical History:  Procedure Laterality Date  . ACHILLES TENDON SURGERY Right 05/26/2016   Procedure: RIGHT ACHILLES TENDON REPAIR,CALCANEUS SPUR EXCISION;  Surgeon: Ninetta Lights, MD;  Location: [facility];  Service: Orthopedics;  Laterality: Right;  . CARDIAC CATHETERIZATION  10-17-2005   DR COOPER   NON-OBSTRUCTIVE CAD/   mLAD 30-40%/   NORMAL LVF/  EF 65%  . CARDIOVASCULAR STRESS TEST  04-12-2011  DR Carleene Overlie TAYLOR   LOW RISK STUDY/  NO ISCHEMIA/  EF 68%  . CATARACT EXTRACTION W/ INTRAOCULAR LENS IMPLANT Right 2003  . COLONOSCOPY W/ POLYPECTOMY  03-17-2011  . CYSTOSCOPY WITH URETEROSCOPY Right 06/14/2013   Procedure: RIGHT  URETEROSCOPY,ureteral and urethral dilitation;  Surgeon: Claybon Jabs, MD;  Location: [facility];  Service: Urology;   Laterality: Right;  . HEEL SPUR RESECTION Right 05/26/2016   Procedure: HEEL SPUR EXCISION;  Surgeon: Ninetta Lights, MD;  Location: [facility];  Service: Orthopedics;  Laterality: Right;  . HEEL SPUR SURGERY Left 2003  . TOTAL ABDOMINAL HYSTERECTOMY W/ BILATERAL SALPINGOOPHORECTOMY  1978  . TRANSTHORACIC ECHOCARDIOGRAM  04-07-2011   MILD LVH/  MILD FOCAL BASAL SEPTAL HYPERTROPHY/  EF  55-60% /  GRADE I DIASTOLIC DYSFUNCTION    Family History  Problem Relation Age of Onset  . Cancer Mother        breast  . Cancer Father        colon  . Cancer Brother        ?lung  . Breast cancer Neg Hx     Allergies  Allergen  Reactions  . Darvocet [Propoxyphene N-Acetaminophen] Nausea And Vomiting  . Tetanus Toxoids     TDAP hives, pt reports she had TD in the past w/ no issues    Current Outpatient Medications on File Prior to Visit  Medication Sig Dispense Refill  . albuterol (PROVENTIL) (2.5 MG/3ML) 0.083% nebulizer solution Take 3 mLs (2.5 mg total) by nebulization every 6 (six) hours as needed for wheezing or shortness of breath. 150 mL 1  . amLODipine (NORVASC) 10 MG tablet Take 1 tablet by mouth every day 30 tablet 5  . atorvastatin (LIPITOR) 20 MG tablet Take 1 tablet (20 mg total) by mouth every evening. DUE FOR A LAB APPOINTMENT FOR RECHECK 30 tablet 0  . carvedilol (COREG) 6.25 MG tablet Take 1 tablet by mouth twice daily 60 tablet 5  . JANUVIA 100 MG tablet Take 1 tablet by mouth every day 30 tablet 5  . azithromycin (ZITHROMAX) 250 MG tablet Take 1 tablet (250 mg total) by mouth daily. Start with 2 tablets today, then 1 daily thereafter. (Patient not taking: Reported on 08/26/2019) 6 tablet 0  . benzonatate (TESSALON) 100 MG capsule Take 1-2 capsules (100-200 mg total) by mouth 3 (three) times daily as needed. (Patient not taking: Reported on 08/26/2019) 60 capsule 0  . cefdinir (OMNICEF) 300 MG capsule Take 1 capsule (300 mg total) by mouth 2 (two) times daily. (Patient  not taking: Reported on 08/26/2019) 20 capsule 0  . chlorpheniramine-HYDROcodone (TUSSIONEX PENNKINETIC ER) 10-8 MG/5ML SUER Take 5 mLs by mouth every 12 (twelve) hours as needed for cough. (Patient not taking: Reported on 08/26/2019) 100 mL 0  . promethazine-dextromethorphan (PROMETHAZINE-DM) 6.25-15 MG/5ML syrup Take 5 mLs by mouth at bedtime as needed for cough. (Patient not taking: Reported on 08/26/2019) 100 mL 0   No current facility-administered medications on file prior to visit.    BP 124/86   Pulse 64   Temp 98.1 F (36.7 C) (Temporal)   Ht 5\' 5"  (1.651 m)   Wt 201 lb (91.2 kg)   SpO2 97%   BMI 33.45 kg/m    Objective:   Physical Exam  Constitutional: She appears well-nourished.  Cardiovascular: Normal rate and regular rhythm.  Bilateral pedal edema noted, left greater than right. Left lower extremity with mild edema. Difficult to palpate pulses to left foot, moderate swelling present.   Respiratory: Effort normal and breath sounds normal.  Musculoskeletal:     Cervical back: Neck supple.  Skin: Skin is warm and dry.  Psychiatric: She has a normal mood and affect.           Assessment & Plan:

## 2019-08-26 NOTE — Assessment & Plan Note (Signed)
Chronic, daytime tiredness.   Could be secondary to disturbed sleeping as she is waking every 2 hours to urinate.  Other differentials include OSA, deconditioning, CHF.  Checking labs today. Epworth Sleepiness Score of 9 today.  Consider sleep study. Await labs.

## 2019-08-26 NOTE — Assessment & Plan Note (Signed)
Chronic since Covid-19, no worse.  Exam today with clear lungs, no cough. She does have moderate left pedal edema which could be secondary to venous insufficiency.  Checking chest xray and BNP today.

## 2019-08-26 NOTE — Assessment & Plan Note (Signed)
Chronic, didn't have much time to discuss today. She does have numbness to left lower extremity which could be secondary to sciatic nerve involvement, diabetes, lower extremity swelling.  UA today without infection or evidence of renal stone. Consider checking plain films next visit.

## 2019-08-26 NOTE — Patient Instructions (Signed)
Stop by the lab and xray prior to leaving today. I will notify you of your results once received.   You will be contacted regarding your ultrasound.  Please let us know if you have not been contacted within two weeks.   Significantly reduce your consumption of lemonade and sweet tea, these contain a lot of sugar which will cause high blood sugars and cause you to urinate often.  Ensure you are consuming 64 ounces of water daily.  Elevate your legs when resting.   It was a pleasure to see you today!

## 2019-08-26 NOTE — Assessment & Plan Note (Signed)
Urinary symptoms are suspicious for hyperglycemia, especially since she is drinking large amounts of lemonade and sweet tea.  Discussed to significantly reduce sweets, including beverages. UA today with trace glucose.  A1C pending.

## 2019-08-27 LAB — CBC WITH DIFFERENTIAL/PLATELET
Basophils Absolute: 0.1 10*3/uL (ref 0.0–0.1)
Basophils Relative: 1.1 % (ref 0.0–3.0)
Eosinophils Absolute: 0.1 10*3/uL (ref 0.0–0.7)
Eosinophils Relative: 1.8 % (ref 0.0–5.0)
HCT: 37.8 % (ref 36.0–46.0)
Hemoglobin: 12.3 g/dL (ref 12.0–15.0)
Lymphocytes Relative: 42.8 % (ref 12.0–46.0)
Lymphs Abs: 3 10*3/uL (ref 0.7–4.0)
MCHC: 32.5 g/dL (ref 30.0–36.0)
MCV: 88.1 fl (ref 78.0–100.0)
Monocytes Absolute: 0.7 10*3/uL (ref 0.1–1.0)
Monocytes Relative: 9.4 % (ref 3.0–12.0)
Neutro Abs: 3.1 10*3/uL (ref 1.4–7.7)
Neutrophils Relative %: 44.9 % (ref 43.0–77.0)
Platelets: 194 10*3/uL (ref 150.0–400.0)
RBC: 4.29 Mil/uL (ref 3.87–5.11)
RDW: 15.3 % (ref 11.5–15.5)
WBC: 7 10*3/uL (ref 4.0–10.5)

## 2019-08-27 LAB — HEMOGLOBIN A1C: Hgb A1c MFr Bld: 6.7 % — ABNORMAL HIGH (ref 4.6–6.5)

## 2019-08-27 LAB — TSH: TSH: 1.26 u[IU]/mL (ref 0.35–4.50)

## 2019-08-27 LAB — URIC ACID: Uric Acid, Serum: 5.9 mg/dL (ref 2.4–7.0)

## 2019-08-27 LAB — BRAIN NATRIURETIC PEPTIDE: Pro B Natriuretic peptide (BNP): 54 pg/mL (ref 0.0–100.0)

## 2019-08-28 ENCOUNTER — Encounter: Payer: Self-pay | Admitting: Primary Care

## 2019-09-11 ENCOUNTER — Other Ambulatory Visit: Payer: Medicare Other

## 2019-09-13 ENCOUNTER — Telehealth: Payer: Self-pay

## 2019-09-13 ENCOUNTER — Ambulatory Visit
Admission: RE | Admit: 2019-09-13 | Discharge: 2019-09-13 | Disposition: A | Discharge status: Home / Self Care | Payer: Medicare Other | Source: Ambulatory Visit | Attending: Primary Care | Admitting: Primary Care

## 2019-09-13 DIAGNOSIS — R600 Localized edema: Secondary | ICD-10-CM

## 2019-09-13 NOTE — Telephone Encounter (Signed)
LVM for pt to call for result. PCP msg below concerning result.   Pleas Koch, NP  09/13/2019 4:19 PM EDT Back to Top    Please notify patient that her ultrasound was negative for blood clot in the legs.  How is her swelling?

## 2019-09-26 ENCOUNTER — Encounter: Payer: Self-pay | Admitting: *Deleted

## 2019-09-30 ENCOUNTER — Other Ambulatory Visit: Payer: Self-pay | Admitting: Primary Care

## 2019-09-30 DIAGNOSIS — E119 Type 2 diabetes mellitus without complications: Secondary | ICD-10-CM

## 2019-09-30 DIAGNOSIS — I10 Essential (primary) hypertension: Secondary | ICD-10-CM

## 2019-12-06 ENCOUNTER — Emergency Department (HOSPITAL_COMMUNITY)
Admission: EM | Admit: 2019-12-06 | Discharge: 2019-12-07 | Disposition: A | Discharge status: Home / Self Care | Payer: Medicare Other | Attending: Emergency Medicine | Admitting: Emergency Medicine

## 2019-12-06 ENCOUNTER — Encounter (HOSPITAL_COMMUNITY): Payer: Self-pay

## 2019-12-06 ENCOUNTER — Other Ambulatory Visit: Payer: Self-pay

## 2019-12-06 ENCOUNTER — Encounter (HOSPITAL_COMMUNITY): Payer: Self-pay | Admitting: Emergency Medicine

## 2019-12-06 ENCOUNTER — Ambulatory Visit (HOSPITAL_COMMUNITY)
Admission: EM | Admit: 2019-12-06 | Discharge: 2019-12-06 | Disposition: A | Discharge status: Other Acute Inpatient Hospital | Payer: Medicare Other | Attending: Emergency Medicine | Admitting: Emergency Medicine

## 2019-12-06 ENCOUNTER — Emergency Department (HOSPITAL_COMMUNITY): Payer: Medicare Other

## 2019-12-06 DIAGNOSIS — E119 Type 2 diabetes mellitus without complications: Secondary | ICD-10-CM | POA: Insufficient documentation

## 2019-12-06 DIAGNOSIS — I110 Hypertensive heart disease with heart failure: Secondary | ICD-10-CM | POA: Diagnosis not present

## 2019-12-06 DIAGNOSIS — Z79899 Other long term (current) drug therapy: Secondary | ICD-10-CM | POA: Insufficient documentation

## 2019-12-06 DIAGNOSIS — I5030 Unspecified diastolic (congestive) heart failure: Secondary | ICD-10-CM | POA: Diagnosis not present

## 2019-12-06 DIAGNOSIS — R0789 Other chest pain: Secondary | ICD-10-CM | POA: Diagnosis not present

## 2019-12-06 DIAGNOSIS — R05 Cough: Secondary | ICD-10-CM | POA: Diagnosis not present

## 2019-12-06 DIAGNOSIS — Z7984 Long term (current) use of oral hypoglycemic drugs: Secondary | ICD-10-CM | POA: Diagnosis not present

## 2019-12-06 DIAGNOSIS — R079 Chest pain, unspecified: Secondary | ICD-10-CM

## 2019-12-06 DIAGNOSIS — I2510 Atherosclerotic heart disease of native coronary artery without angina pectoris: Secondary | ICD-10-CM | POA: Insufficient documentation

## 2019-12-06 DIAGNOSIS — R0602 Shortness of breath: Secondary | ICD-10-CM | POA: Diagnosis not present

## 2019-12-06 LAB — CBC WITH DIFFERENTIAL/PLATELET
Abs Immature Granulocytes: 0.04 10*3/uL (ref 0.00–0.07)
Basophils Absolute: 0 10*3/uL (ref 0.0–0.1)
Basophils Relative: 0 %
Eosinophils Absolute: 0.1 10*3/uL (ref 0.0–0.5)
Eosinophils Relative: 2 %
HCT: 38.8 % (ref 36.0–46.0)
Hemoglobin: 12.5 g/dL (ref 12.0–15.0)
Immature Granulocytes: 1 %
Lymphocytes Relative: 41 %
Lymphs Abs: 3.2 10*3/uL (ref 0.7–4.0)
MCH: 28.9 pg (ref 26.0–34.0)
MCHC: 32.2 g/dL (ref 30.0–36.0)
MCV: 89.8 fL (ref 80.0–100.0)
Monocytes Absolute: 0.5 10*3/uL (ref 0.1–1.0)
Monocytes Relative: 7 %
Neutro Abs: 3.9 10*3/uL (ref 1.7–7.7)
Neutrophils Relative %: 49 %
Platelets: 194 10*3/uL (ref 150–400)
RBC: 4.32 MIL/uL (ref 3.87–5.11)
RDW: 15.3 % (ref 11.5–15.5)
WBC: 7.9 10*3/uL (ref 4.0–10.5)
nRBC: 0 % (ref 0.0–0.2)

## 2019-12-06 LAB — COMPREHENSIVE METABOLIC PANEL
ALT: 14 U/L (ref 0–44)
AST: 20 U/L (ref 15–41)
Albumin: 3.9 g/dL (ref 3.5–5.0)
Alkaline Phosphatase: 74 U/L (ref 38–126)
Anion gap: 10 (ref 5–15)
BUN: 17 mg/dL (ref 8–23)
CO2: 23 mmol/L (ref 22–32)
Calcium: 9.3 mg/dL (ref 8.9–10.3)
Chloride: 107 mmol/L (ref 98–111)
Creatinine, Ser: 0.89 mg/dL (ref 0.44–1.00)
GFR calc Af Amer: >60 mL/min (ref >60)
GFR calc non Af Amer: >60 mL/min (ref >60)
Glucose, Bld: 99 mg/dL (ref 70–99)
Potassium: 3.9 mmol/L (ref 3.5–5.1)
Sodium: 140 mmol/L (ref 135–145)
Total Bilirubin: 0.5 mg/dL (ref 0.3–1.2)
Total Protein: 7.4 g/dL (ref 6.5–8.1)

## 2019-12-06 LAB — TROPONIN I (HIGH SENSITIVITY): Troponin I (High Sensitivity): 4 ng/L (ref <18)

## 2019-12-06 MED ORDER — ASPIRIN 81 MG PO CHEW: 324.0000 mg | CHEWABLE_TABLET | Freq: Once | ORAL | Status: DC

## 2019-12-06 NOTE — ED Provider Notes (Signed)
Brenda Flores presents with complaints of 9/10 chest pain to left chest which radiates to left arm and left neck and jaw. Started this morning at 0230. No nausea or vomiting. No diaphoresis. Mild shortness of breath. Some cough. Denies  Any previous similar. No abdominal pain. No fevers. No known triggers. Has been hospitalized in the past related to chest pain, and history of htn and CAD, as well as mitral valve prolapse. Doesn't smoke. ekg today appears similar to previous ekgs. Patient did drive herself here today. Active pain currently, ems called to UC to provide transport to ER for ACE rule out. Patient verbalized understanding and agreeable to plan.       Zigmund Gottron, NP 12/06/19 1856

## 2019-12-06 NOTE — ED Notes (Signed)
EMS called to transport pt to ED for active chest pain.

## 2019-12-06 NOTE — ED Triage Notes (Signed)
Emergency Medicine Provider Triage Evaluation Note  Brenda Flores , a 74 y.o. female  was evaluated in triage.  Pt complains of chest pain.  Patient was awoken from sleep at 230 this morning with sharp chest pain.  Chest pain radiates to her jaw and left arm.  Associated shortness of breath.  No nausea.  Patient reports diaphoresis this morning, but that is since resolved.  She was seen at urgent care, had a normal EKG, but sent to the ER for further evaluation.  She has a history of mitral valve prolapse, does not follow with cardiology regularly.  She has not taken anything for her pain.  She has not received any aspirin today.  She is not on any blood thinners.  Review of Systems  Positive: Cp, sob.  Negative: N/v, cough, fever  Physical Exam  BP (!) 109/97 (BP Location: Right Arm)   Pulse 72   Temp 98.3 F (36.8 C) (Oral)   Resp 20   SpO2 96%  Gen:   Awake, no distress   HEENT:  Atraumatic  Resp:  Normal effort  Cardiac:  Normal rate  Abd:   Nondistended, nontender  MSK:   Moves extremities without difficulty  Neuro:  Speech clear   Medical Decision Making  Medically screening exam initiated at 8:24 PM.  Appropriate orders placed.  [name] was informed that the remainder of the evaluation will be completed by another provider, this initial triage assessment does not replace that evaluation, and the importance of remaining in the ED until their evaluation is complete.  Clinical Impression   Presenting with chest pain.  Associated shortness of breath.  Pain radiates to jaw and left arm.  Considering age, higher risk for CAD.  Will obtain labs, EKG, chest x-ray.   Franchot Heidelberg, PA-C 12/06/19 2026

## 2019-12-06 NOTE — ED Triage Notes (Signed)
Pt c/o 9/10 sharp pain that travels down left arm and left side of neck started at 0200 today. Pt c/o 9/10 pain in left ear that started today. Pt c/o 9/10 pain in left shoulder that started today.

## 2019-12-06 NOTE — Discharge Instructions (Signed)
Go to the ER now.

## 2019-12-06 NOTE — ED Triage Notes (Signed)
Pt states she was sent by UC for evaluation of chest pain, left arm pain. Chest pain radiates to her jaw, arm pain worse with movement. Reports pain started at 0230 this am while she was sleeping.

## 2019-12-06 NOTE — ED Notes (Signed)
Patient is being discharged from the Urgent Care and sent to the Emergency Department via EMS . Per Lanelle Bal, [place], patient is in need of higher level of care due to needing higher level of care and further testing. Patient is aware and verbalizes understanding of plan of care.  Vitals:   12/06/19 1834  BP: (!) 144/71  Pulse: 67  Resp: 16  Temp: 98.2 F (36.8 C)  SpO2: 96%

## 2019-12-06 NOTE — ED Notes (Signed)
Ems called Call report to charge nurse Reynolds Bowl at [facility] ED

## 2019-12-07 DIAGNOSIS — R079 Chest pain, unspecified: Secondary | ICD-10-CM | POA: Diagnosis not present

## 2019-12-07 LAB — TROPONIN I (HIGH SENSITIVITY): Troponin I (High Sensitivity): 3 ng/L (ref <18)

## 2019-12-07 NOTE — Discharge Instructions (Addendum)
Suspect musculoskeletal type pain.  Take Tylenol and Motrin.  Follow-up with primary care doctor if pain persists.

## 2019-12-07 NOTE — ED Notes (Signed)
AVS reviewed with pt who verbalized understanding. Ambulatory out of dept.

## 2019-12-07 NOTE — ED Provider Notes (Signed)
[place] EMERGENCY DEPARTMENT Provider Note   CSN: [id number] Arrival date & time: 12/06/19  1956     History Chief Complaint  Patient presents with  . Chest Pain    Brenda Flores is a 74 y.o. female.  The history is provided by the patient.  Chest Pain Pain location:  L chest (leck neck) Pain quality: aching   Pain severity:  Mild Onset quality:  Gradual Duration:  2 days Timing:  Intermittent Progression:  Waxing and waning Chronicity:  New Context: raising an arm (movement of neck to the left)   Relieved by:  Nothing Worsened by:  Movement Associated symptoms: no abdominal pain, no back pain, no cough, no fever, no palpitations, no shortness of breath and no vomiting   Risk factors: coronary artery disease        Past Medical History:  Diagnosis Date  . Achilles rupture, right   . Bronchitis   . CAD (coronary artery disease) Dr Burt Knack   non-obs CAD by cath in 7/07 (mLAD 30-40%, EF 65%)  . DDD (degenerative disc disease), lumbar   . DJD (degenerative joint disease)    KNEES  . GERD (gastroesophageal reflux disease)   . History of mitral valve prolapse    PER PT REPORTED MVP  PER DR HARSHAW FROM CARDIAC CATH IN 1987 (APPROX)  . 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 Hyperlipidemia   . Hypertension   . Left nephrolithiasis    NON-OBSTRUCTIVE  . Liver, polycystic   . Lumbar radiculopathy   . Osteoporosis   . PONV (postoperative nausea and vomiting)   . Right ureteral stone   . Type 2 diabetes mellitus [facility])     Patient Active Problem List   Diagnosis Date Noted  . Lower extremity edema 08/26/2019  . Exertional dyspnea 08/26/2019  . Fatigue 08/26/2019  . Chronic back pain 08/26/2019  . Chronic pain in left foot 09/26/2018  . Shoulder pain 05/30/2018  . Chronic neck pain 12/06/2017  . Medicare annual wellness visit, subsequent 11/28/2016  . Cough   . Diabetes type 2,  controlled ([place])   . HLD (hyperlipidemia)   . History of mitral valve prolapse 04/05/2012  . Liver cyst   . Diastolic heart failure, NYHA class 1 ([place]) 04/08/2011  . Lumbar radiculopathy 04/07/2011  . CAD in native artery 04/07/2011  . Lumbar disc disease 04/07/2011  . Osteoarthritis 04/07/2011  . Hypertension 04/06/2011    Past Surgical History:  Procedure Laterality Date  . ACHILLES TENDON SURGERY Right 05/26/2016   Procedure: RIGHT ACHILLES TENDON REPAIR,CALCANEUS SPUR EXCISION;  Surgeon: Ninetta Lights, MD;  Location: [place];  Service: Orthopedics;  Laterality: Right;  . CARDIAC CATHETERIZATION  10-17-2005   DR COOPER   NON-OBSTRUCTIVE CAD/   mLAD 30-40%/   NORMAL LVF/  EF 65%  . CARDIOVASCULAR STRESS TEST  04-12-2011  DR Carleene Overlie TAYLOR   LOW RISK STUDY/  NO ISCHEMIA/  EF 68%  . CATARACT EXTRACTION W/ INTRAOCULAR LENS IMPLANT Right 2003  . COLONOSCOPY W/ POLYPECTOMY  03-17-2011  . CYSTOSCOPY WITH URETEROSCOPY Right 06/14/2013   Procedure: RIGHT  URETEROSCOPY,ureteral and urethral dilitation;  Surgeon: Claybon Jabs, MD;  Location: [facility];  Service: Urology;  Laterality: Right;  . HEEL SPUR RESECTION Right 05/26/2016   Procedure: HEEL SPUR EXCISION;  Surgeon: Ninetta Lights, MD;  Location: [place];  Service: Orthopedics;  Laterality: Right;  .  HEEL SPUR SURGERY Left 2003  . TOTAL ABDOMINAL HYSTERECTOMY W/ BILATERAL SALPINGOOPHORECTOMY  1978  . TRANSTHORACIC ECHOCARDIOGRAM  04-07-2011   MILD LVH/  MILD FOCAL BASAL SEPTAL HYPERTROPHY/  EF  55-60% /  GRADE I DIASTOLIC DYSFUNCTION     OB History   No obstetric history on file.     Family History  Problem Relation Age of Onset  . Cancer Mother        breast  . Cancer Father        colon  . Cancer Brother        ?lung  . Breast cancer Neg Hx     Social History   Tobacco Use  . Smoking status: Never Smoker  . Smokeless tobacco: Never Used  Vaping Use  . Vaping Use:  Never used  Substance Use Topics  . Alcohol use: No    Alcohol/week: 0.0 standard drinks  . Drug use: No    Home Medications Prior to Admission medications   Medication Sig Start Date End Date Taking? Authorizing Provider  albuterol (PROVENTIL) (2.5 MG/3ML) 0.083% nebulizer solution Take 3 mLs (2.5 mg total) by nebulization every 6 (six) hours as needed for wheezing or shortness of breath. 04/08/19  Yes Tonia Ghent, MD  amLODipine (NORVASC) 10 MG tablet Take 1 tablet by mouth every day 09/30/19  Yes Pleas Koch, NP  atorvastatin (LIPITOR) 20 MG tablet Take 1 tablet (20 mg total) by mouth every evening. DUE FOR A LAB APPOINTMENT FOR RECHECK 04/10/19  Yes Pleas Koch, NP  carvedilol (COREG) 6.25 MG tablet Take 1 tablet by mouth twice daily 09/30/19  Yes Pleas Koch, NP  JANUVIA 100 MG tablet Take 1 tablet by mouth every day 09/30/19  Yes Pleas Koch, NP  Multiple Vitamin (MULTIVITAMIN WITH MINERALS) TABS tablet Take 1 tablet by mouth daily.   Yes [provider]  TURMERIC PO Take 1 capsule by mouth daily.   Yes [provider]  azithromycin (ZITHROMAX) 250 MG tablet Take 1 tablet (250 mg total) by mouth daily. Start with 2 tablets today, then 1 daily thereafter. Patient not taking: Reported on 08/26/2019 04/19/19   Jaynee Eagles, PA-C  benzonatate (TESSALON) 100 MG capsule Take 1-2 capsules (100-200 mg total) by mouth 3 (three) times daily as needed. Patient not taking: Reported on 08/26/2019 04/19/19   Jaynee Eagles, PA-C  cefdinir (OMNICEF) 300 MG capsule Take 1 capsule (300 mg total) by mouth 2 (two) times daily. Patient not taking: Reported on 08/26/2019 04/19/19   Jaynee Eagles, PA-C  chlorpheniramine-HYDROcodone [facility] ER) 10-8 MG/5ML SUER Take 5 mLs by mouth every 12 (twelve) hours as needed for cough. Patient not taking: Reported on 08/26/2019 04/08/19   Tonia Ghent, MD  promethazine-dextromethorphan (PROMETHAZINE-DM) 6.25-15 MG/5ML  syrup Take 5 mLs by mouth at bedtime as needed for cough. Patient not taking: Reported on 08/26/2019 04/19/19   Jaynee Eagles, PA-C    Allergies    Darvocet [propoxyphene n-acetaminophen] and Tetanus toxoids  Review of Systems   Review of Systems  Constitutional: Negative for chills and fever.  HENT: Negative for ear pain and sore throat.   Eyes: Negative for pain and visual disturbance.  Respiratory: Negative for cough and shortness of breath.   Cardiovascular: Positive for chest pain. Negative for palpitations.  Gastrointestinal: Negative for abdominal pain and vomiting.  Genitourinary: Negative for dysuria and hematuria.  Musculoskeletal: Positive for arthralgias and neck pain. Negative for back pain.  Skin: Negative for color  change and rash.  Neurological: Negative for seizures and syncope.  All other systems reviewed and are negative.   Physical Exam Updated Vital Signs BP 118/73   Pulse (!) 58   Temp 98.3 F (36.8 C) (Oral)   Resp 16   SpO2 99%   Physical Exam Vitals and nursing note reviewed.  Constitutional:      General: She is not in acute distress.    Appearance: She is well-developed. She is not ill-appearing.  HENT:     Head: Normocephalic and atraumatic.  Eyes:     Conjunctiva/sclera: Conjunctivae normal.  Cardiovascular:     Rate and Rhythm: Normal rate and regular rhythm.     Pulses:          Radial pulses are 2+ on the right side and 2+ on the left side.     Heart sounds: Normal heart sounds. No murmur heard.   Pulmonary:     Effort: Pulmonary effort is normal. No respiratory distress.     Breath sounds: Normal breath sounds. No decreased breath sounds, wheezing or rhonchi.  Chest:     Chest wall: Tenderness (TTP to left anterior chest wall and left trapezius area) present.  Abdominal:     Palpations: Abdomen is soft.     Tenderness: There is no abdominal tenderness.  Musculoskeletal:        General: Normal range of motion.     Cervical back: Neck  supple.     Right lower leg: No edema.     Left lower leg: No edema.  Skin:    General: Skin is warm and dry.     Capillary Refill: Capillary refill takes less than 2 seconds.  Neurological:     General: No focal deficit present.     Mental Status: She is alert.     ED Results / Procedures / Treatments   Labs (all labs ordered are listed, but only abnormal results are displayed) Labs Reviewed  CBC WITH DIFFERENTIAL/PLATELET  COMPREHENSIVE METABOLIC PANEL  TROPONIN I (HIGH SENSITIVITY)  TROPONIN I (HIGH SENSITIVITY)    EKG EKG Interpretation  Date/Time:  Friday December 06 2019 20:22:07 EDT Ventricular Rate:  70 PR Interval:  166 QRS Duration: 106 QT Interval:  418 QTC Calculation: 451 R Axis:   -37 Text Interpretation: Normal sinus rhythm Left axis deviation Moderate voltage criteria for LVH, may be normal variant ( R in aVL , Cornell product ) Septal infarct , age undetermined Abnormal ECG Confirmed by Lennice Sites [phone]) on 12/07/2019 8:55:52 AM   Radiology DG Chest 2 View  Result Date: 12/06/2019 CLINICAL DATA:  Chest pain, radiating to left arm, right ear, jaw. Productive cough. Shortness of breath. Hypertension, diabetes, history of bronchitis. Nonsmoker. EXAM: CHEST - 2 VIEW COMPARISON:  chest x-ray 08/26/2019, CT chest 09/23/2016. FINDINGS: The heart size and mediastinal contours are within normal limits. No focal consolidation. No pulmonary edema. No pleural effusion. No pneumothorax. No acute osseous abnormality. Multilevel degenerative changes of the spine. IMPRESSION: No active cardiopulmonary disease. Electronically Signed   By: Iven Finn M.D.   On: 12/06/2019 21:57    Procedures Procedures (including critical care time)  Medications Ordered in ED Medications  aspirin chewable tablet 324 mg (has no administration in time range)    ED Course  I have reviewed the triage vital signs and the nursing notes.  Pertinent labs & imaging results that  were available during my care of the patient were reviewed by me and considered in my  medical decision making (see chart for details).    MDM Rules/Calculators/A&P                          AROHI SALVATIERRA is a 74 year old female with history of hypertension, high cholesterol, CAD presents the ED with left-sided neck pain, chest pain, arm pain.  Patient with unremarkable vitals.  No fever.  About 24 hours ago as patient has been waiting in the ED for about 18 hours patient had left-sided chest wall pain, left-sided neck pain.  Worse with movement worse when she turns her head to the left.  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  Patient has reproducible chest wall pain left-sided trapezius pain on exam.  Pain has been fairly continuous but mostly worse when she turns her head to the left.  She has already had EKG that shows sinus rhythm.  No ischemic changes.  First troponin is unremarkable.  No significant anemia, electrolyte abnormality, kidney injury.  Chest x-ray shows no signs of infection.  Overall suspect musculoskeletal type chest pain.  However given her cardiac history we will get a second troponin which will be over 24 hours from when her pain began if negative feel like she can do Tylenol, Motrin and follow-up with primary care doctor.  Troponin within normal limits.  Overall suspect MSK source.  Discharged in good condition.  Understands return precautions.  This chart was dictated using voice recognition software.  Despite best efforts to proofread,  errors can occur which can change the documentation meaning.    Final Clinical Impression(s) / ED Diagnoses Final diagnoses:  Nonspecific chest pain    Rx / DC Orders ED Discharge Orders    None       Lennice Sites, DO 12/07/19 1118

## 2020-02-09 ENCOUNTER — Other Ambulatory Visit: Payer: Self-pay | Admitting: Primary Care

## 2020-02-09 DIAGNOSIS — E785 Hyperlipidemia, unspecified: Secondary | ICD-10-CM

## 2020-02-27 ENCOUNTER — Other Ambulatory Visit: Payer: Self-pay

## 2020-02-27 ENCOUNTER — Encounter: Payer: Self-pay | Admitting: Primary Care

## 2020-02-27 ENCOUNTER — Ambulatory Visit (INDEPENDENT_AMBULATORY_CARE_PROVIDER_SITE_OTHER): Payer: Medicare Other | Admitting: Primary Care

## 2020-02-27 VITALS — BP 118/73 | HR 65 | Temp 98.6°F | Ht 65.5 in | Wt 200.0 lb

## 2020-02-27 DIAGNOSIS — I10 Essential (primary) hypertension: Secondary | ICD-10-CM

## 2020-02-27 DIAGNOSIS — E119 Type 2 diabetes mellitus without complications: Secondary | ICD-10-CM

## 2020-02-27 DIAGNOSIS — Z8679 Personal history of other diseases of the circulatory system: Secondary | ICD-10-CM | POA: Diagnosis not present

## 2020-02-27 DIAGNOSIS — R5383 Other fatigue: Secondary | ICD-10-CM

## 2020-02-27 DIAGNOSIS — Z1231 Encounter for screening mammogram for malignant neoplasm of breast: Secondary | ICD-10-CM

## 2020-02-27 DIAGNOSIS — I2510 Atherosclerotic heart disease of native coronary artery without angina pectoris: Secondary | ICD-10-CM | POA: Diagnosis not present

## 2020-02-27 DIAGNOSIS — E785 Hyperlipidemia, unspecified: Secondary | ICD-10-CM

## 2020-02-27 DIAGNOSIS — R0609 Other forms of dyspnea: Secondary | ICD-10-CM

## 2020-02-27 DIAGNOSIS — Z1211 Encounter for screening for malignant neoplasm of colon: Secondary | ICD-10-CM | POA: Diagnosis not present

## 2020-02-27 DIAGNOSIS — I5030 Unspecified diastolic (congestive) heart failure: Secondary | ICD-10-CM

## 2020-02-27 DIAGNOSIS — R0600 Dyspnea, unspecified: Secondary | ICD-10-CM | POA: Diagnosis not present

## 2020-02-27 DIAGNOSIS — E2839 Other primary ovarian failure: Secondary | ICD-10-CM

## 2020-02-27 DIAGNOSIS — R600 Localized edema: Secondary | ICD-10-CM | POA: Diagnosis not present

## 2020-02-27 DIAGNOSIS — M25519 Pain in unspecified shoulder: Secondary | ICD-10-CM

## 2020-02-27 LAB — POCT GLYCOSYLATED HEMOGLOBIN (HGB A1C): Hemoglobin A1C: 6 % — AB (ref 4.0–5.6)

## 2020-02-27 LAB — CBC
HCT: 36.4 % (ref 36.0–46.0)
Hemoglobin: 12 g/dL (ref 12.0–15.0)
MCHC: 33 g/dL (ref 30.0–36.0)
MCV: 86.4 fl (ref 78.0–100.0)
Platelets: 207 10*3/uL (ref 150.0–400.0)
RBC: 4.22 Mil/uL (ref 3.87–5.11)
RDW: 15.6 % — ABNORMAL HIGH (ref 11.5–15.5)
WBC: 5.7 10*3/uL (ref 4.0–10.5)

## 2020-02-27 LAB — LIPID PANEL
Cholesterol: 175 mg/dL (ref 0–200)
HDL: 41.9 mg/dL (ref >39.00)
LDL Cholesterol: 112 mg/dL — ABNORMAL HIGH (ref 0–99)
NonHDL: 133.22
Total CHOL/HDL Ratio: 4
Triglycerides: 105 mg/dL (ref 0.0–149.0)
VLDL: 21 mg/dL (ref 0.0–40.0)

## 2020-02-27 LAB — MICROALBUMIN / CREATININE URINE RATIO
Creatinine,U: 209.7 mg/dL
Microalb Creat Ratio: 0.7 mg/g (ref 0.0–30.0)
Microalb, Ur: 1.5 mg/dL (ref 0.0–1.9)

## 2020-02-27 LAB — BASIC METABOLIC PANEL
BUN: 14 mg/dL (ref 6–23)
CO2: 29 mEq/L (ref 19–32)
Calcium: 9 mg/dL (ref 8.4–10.5)
Chloride: 106 mEq/L (ref 96–112)
Creatinine, Ser: 0.8 mg/dL (ref 0.40–1.20)
GFR: 73.23 mL/min (ref >60.00)
Glucose, Bld: 100 mg/dL — ABNORMAL HIGH (ref 70–99)
Potassium: 3.8 mEq/L (ref 3.5–5.1)
Sodium: 143 mEq/L (ref 135–145)

## 2020-02-27 LAB — TSH: TSH: 1.76 u[IU]/mL (ref 0.35–4.50)

## 2020-02-27 NOTE — Assessment & Plan Note (Signed)
Chronic dyspnea that is exertional with chronic left-sided chest pain that is reproducible with movement.  Unfortunately she no showed her cardiology referral in 2020.  Reviewed work-up from emergency department visit in September 2021.  No acute cardiac issue.  Will reorder echocardiogram as her last one was in 2016.  Continue blood pressure, diabetes, cholesterol management

## 2020-02-27 NOTE — Assessment & Plan Note (Signed)
Repeat lipid panel pending. Continue atorvastatin 20 mg.  

## 2020-02-27 NOTE — Assessment & Plan Note (Signed)
Well-controlled in the office today.  Continue amlodipine 10 mg daily, carvedilol 6.25 mg twice daily.

## 2020-02-27 NOTE — Assessment & Plan Note (Signed)
Chronic, related to bilateral feet on exam today. She is managed on amlodipine.  Repeat echocardiogram pending.

## 2020-02-27 NOTE — Assessment & Plan Note (Addendum)
Chronic and ongoing. She is also quite sedentary, discussed to increase activity level to increase endurance.  Labs today pending to rule out metabolic cause.

## 2020-02-27 NOTE — Assessment & Plan Note (Signed)
Chronic since COVID-19 infection in March 2021. Work-up for cardiac cause negative in September 2021 when she presented to the emergency department.  We will repeat echocardiogram as her last one was in 2016.  Discussed to refrain from using albuterol inhaler so frequently.

## 2020-02-27 NOTE — Assessment & Plan Note (Signed)
No evidence of this on echocardiogram from 2016.  Repeat echocardiogram pending.

## 2020-02-27 NOTE — Progress Notes (Signed)
Subjective:    Patient ID: Brenda Flores, female    DOB: July 14, 1945, 74 y.o.   MRN: [id number]  HPI  This visit occurred during the SARS-CoV-2 public health emergency.  Safety protocols were in place, including screening questions prior to the visit, additional usage of staff PPE, and extensive cleaning of exam room while observing appropriate contact time as indicated for disinfecting solutions.   Brenda Flores is a 74 year old female who presents today for follow up of chronic conditions. She is due for mammogram, bone density, colonoscopy.   1) CAD/Hypertension/CHF: Currently managed on amlodipine 10 mg, carvedilol 6.25 mg, atorvastatin 20 mg. She endorses a chronic, dull pain to the left chest that she mostly notices when lifting heavy objects and when laying down resting. She does have chronic left shoulder pain with decrease in ROM, was diagnosed with partial rotor cuff tear about 2 years ago. She proceeded to physical therapy with improvement.   She denies chest pain with nausea, diaphoresis. She was evaluated at [facility] on 12/06/19, ECG, chest xray, labs were without cardiac cause.   She does have chronic shortness of breath since March 2021 after Covid-19 infection. She's been using her albuterol nebulizer twice daily for the last 2 weeks. She's not a smoker, was told "asthma/bronchitis" years ago. She's had an albuterol inhaler for years.  She does have a history of murmur, underwent echocardiogram in 2016 for dyspnea which showed LVEF of 65 to 70%, normal wall motion, impaired relaxation with elevated filling pressures.  BP Readings from Last 3 Encounters:  02/27/20 118/73  12/07/19 118/73  12/06/19 (!) 144/71   2) Type 2 Diabetes:  Current medications include: Januvia 100 mg.  She is checking her blood glucose 0 times daily.   Last A1C: 6.7 in June 2021, 6.0 today Last Eye Exam: UTD Last Foot Exam: Due Pneumonia Vaccination: 2018 ACE/ARB: None. Urine  microalbumin, due today Statin: atorvastatin   She admits to a sedentary lifestyle, plans on getting into the gym again soon. Poor diet, but is working to change her diet by reducing sweets.   3) History of Covid-19: Diagnosed in March 2021. Has been vaccinated since. Since Covid-19 she's noticed fatigue and exertional dyspnea.  She does not quite feel back to her norm.  Review of Systems  Constitutional: Positive for fatigue.  Eyes: Negative for visual disturbance.  Respiratory: Positive for shortness of breath. Negative for cough.   Cardiovascular: Negative for chest pain.  Neurological: Negative for dizziness and headaches.       Past Medical History:  Diagnosis Date  . Achilles rupture, right   . Bronchitis   . CAD (coronary artery disease) Dr Burt Knack   non-obs CAD by cath in 7/07 (mLAD 30-40%, EF 65%)  . DDD (degenerative disc disease), lumbar   . DJD (degenerative joint disease)    KNEES  . GERD (gastroesophageal reflux disease)   . History of mitral valve prolapse    PER PT REPORTED MVP  PER DR HARSHAW FROM CARDIAC CATH IN 1987 (APPROX)  . 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 Hyperlipidemia   . Hypertension   . Left nephrolithiasis    NON-OBSTRUCTIVE  . Liver, polycystic   . Lumbar radiculopathy   . Osteoporosis   . PONV (postoperative nausea and vomiting)   . Right ureteral stone   . Type 2 diabetes mellitus ([place])      Social History   Socioeconomic History  .  Marital status: Widowed    Spouse name: Not on file  . Number of children: Not on file  . Years of education: Not on file  . Highest education level: Not on file  Occupational History  . Not on file  Tobacco Use  . Smoking status: Never Smoker  . Smokeless tobacco: Never Used  Vaping Use  . Vaping Use: Never used  Substance and Sexual Activity  . Alcohol use: No    Alcohol/week: 0.0 standard drinks  . Drug use: No  . Sexual activity: Not on  file  Other Topics Concern  . Not on file  Social History Narrative  . Not on file   Social Determinants of Health   Financial Resource Strain: Not on file  Food Insecurity: Not on file  Transportation Needs: Not on file  Physical Activity: Not on file  Stress: Not on file  Social Connections: Not on file  Intimate Partner Violence: Not on file    Past Surgical History:  Procedure Laterality Date  . ACHILLES TENDON SURGERY Right 05/26/2016   Procedure: RIGHT ACHILLES TENDON REPAIR,CALCANEUS SPUR EXCISION;  Surgeon: Ninetta Lights, MD;  Location: [place];  Service: Orthopedics;  Laterality: Right;  . CARDIAC CATHETERIZATION  10-17-2005   DR COOPER   NON-OBSTRUCTIVE CAD/   mLAD 30-40%/   NORMAL LVF/  EF 65%  . CARDIOVASCULAR STRESS TEST  04-12-2011  DR Carleene Overlie TAYLOR   LOW RISK STUDY/  NO ISCHEMIA/  EF 68%  . CATARACT EXTRACTION W/ INTRAOCULAR LENS IMPLANT Right 2003  . COLONOSCOPY W/ POLYPECTOMY  03-17-2011  . CYSTOSCOPY WITH URETEROSCOPY Right 06/14/2013   Procedure: RIGHT  URETEROSCOPY,ureteral and urethral dilitation;  Surgeon: Claybon Jabs, MD;  Location: [facility];  Service: Urology;  Laterality: Right;  . HEEL SPUR RESECTION Right 05/26/2016   Procedure: HEEL SPUR EXCISION;  Surgeon: Ninetta Lights, MD;  Location: [place];  Service: Orthopedics;  Laterality: Right;  . HEEL SPUR SURGERY Left 2003  . TOTAL ABDOMINAL HYSTERECTOMY W/ BILATERAL SALPINGOOPHORECTOMY  1978  . TRANSTHORACIC ECHOCARDIOGRAM  04-07-2011   MILD LVH/  MILD FOCAL BASAL SEPTAL HYPERTROPHY/  EF  55-60% /  GRADE I DIASTOLIC DYSFUNCTION    Family History  Problem Relation Age of Onset  . Cancer Mother        breast  . Cancer Father        colon  . Cancer Brother        ?lung  . Breast cancer Neg Hx     Allergies  Allergen Reactions  . Darvocet [Propoxyphene N-Acetaminophen] Nausea And Vomiting  . Tetanus Toxoids     TDAP hives, pt reports she had  TD in the past w/ no issues    Current Outpatient Medications on File Prior to Visit  Medication Sig Dispense Refill  . albuterol (PROVENTIL) (2.5 MG/3ML) 0.083% nebulizer solution Take 3 mLs (2.5 mg total) by nebulization every 6 (six) hours as needed for wheezing or shortness of breath. 150 mL 1  . amLODipine (NORVASC) 10 MG tablet Take 1 tablet by mouth every day 30 tablet 5  . atorvastatin (LIPITOR) 20 MG tablet Take 1 tablet by mouth every evening.NEED LAB APPT 90 tablet 0  . carvedilol (COREG) 6.25 MG tablet Take 1 tablet by mouth twice daily 60 tablet 5  . chlorpheniramine-HYDROcodone (TUSSIONEX PENNKINETIC ER) 10-8 MG/5ML SUER Take 5 mLs by mouth every 12 (twelve) hours as needed for cough. 100 mL 0  .  JANUVIA 100 MG tablet Take 1 tablet by mouth every day 30 tablet 5  . Multiple Vitamin (MULTIVITAMIN WITH MINERALS) TABS tablet Take 1 tablet by mouth daily.    . TURMERIC PO Take 1 capsule by mouth daily.     No current facility-administered medications on file prior to visit.    BP 118/73   Pulse 65   Temp 98.6 F (37 C) (Temporal)   Ht 5' 5.5" (1.664 m)   Wt 200 lb (90.7 kg)   SpO2 98%   BMI 32.78 kg/m    Objective:   Physical Exam Constitutional:      Appearance: She is well-nourished.  Cardiovascular:     Rate and Rhythm: Normal rate and regular rhythm.  Pulmonary:     Effort: Pulmonary effort is normal.     Breath sounds: Normal breath sounds.  Musculoskeletal:     Cervical back: Neck supple.  Skin:    General: Skin is warm and dry.  Psychiatric:        Mood and Affect: Mood and affect normal.            Assessment & Plan:

## 2020-02-27 NOTE — Assessment & Plan Note (Signed)
Mild bilateral pedal edema.  Is managed on amlodipine. Repeat echocardiogram pending. Otherwise appears euvolemic.

## 2020-02-27 NOTE — Assessment & Plan Note (Addendum)
Chronic and continued.  Suspect this may be contributing to her left-sided muscular chest wall pain.  She can provoke pain with movement.  Discussed stretching and strengthening exercises.

## 2020-02-27 NOTE — Assessment & Plan Note (Signed)
Compliant to Januvia 100 mg daily.  Diabetes is well controlled with A1c of 6.0 today.  Foot exam completed today. Pneumonia vaccination up-to-date. Eye exam up-to-date. Urine microalbumin pending. Managed on statin.  Follow-up in 6 months. Continue Januvia 100 mg daily.

## 2020-02-27 NOTE — Patient Instructions (Signed)
You will be contacted regarding your echocardiogram.  Please let us know if you have not been contacted within two weeks.   [facility] mamm[facility] and bone density scan.  Stop by the lab prior to leaving today. I will notify you of your results once received.   It was a pleasure to see you today!

## 2020-02-29 ENCOUNTER — Other Ambulatory Visit: Payer: Self-pay | Admitting: Primary Care

## 2020-02-29 DIAGNOSIS — I10 Essential (primary) hypertension: Secondary | ICD-10-CM

## 2020-03-02 ENCOUNTER — Telehealth: Payer: Self-pay | Admitting: Primary Care

## 2020-03-02 NOTE — Telephone Encounter (Signed)
Patient received a call. NO notes in the system. Stated it was for lab results.

## 2020-03-03 NOTE — Telephone Encounter (Signed)
See lab note for documentation.  

## 2020-03-27 ENCOUNTER — Other Ambulatory Visit: Payer: Self-pay

## 2020-03-27 ENCOUNTER — Ambulatory Visit (HOSPITAL_COMMUNITY): Payer: HMO | Attending: Cardiovascular Disease

## 2020-03-27 DIAGNOSIS — R5383 Other fatigue: Secondary | ICD-10-CM | POA: Diagnosis present

## 2020-03-27 DIAGNOSIS — R0609 Other forms of dyspnea: Secondary | ICD-10-CM

## 2020-03-27 DIAGNOSIS — R600 Localized edema: Secondary | ICD-10-CM | POA: Diagnosis present

## 2020-03-27 DIAGNOSIS — R0600 Dyspnea, unspecified: Secondary | ICD-10-CM

## 2020-03-27 DIAGNOSIS — I2510 Atherosclerotic heart disease of native coronary artery without angina pectoris: Secondary | ICD-10-CM

## 2020-03-27 LAB — ECHOCARDIOGRAM COMPLETE
Area-P 1/2: 3.12 cm2
P 1/2 time: 547 msec
S' Lateral: 2.7 cm

## 2020-03-30 ENCOUNTER — Other Ambulatory Visit: Payer: Self-pay | Admitting: Primary Care

## 2020-03-30 DIAGNOSIS — E119 Type 2 diabetes mellitus without complications: Secondary | ICD-10-CM

## 2020-04-02 NOTE — Progress Notes (Signed)
Cardiology Office Note:   Date:  04/03/2020  NAME:  Brenda Flores    MRN: [id number] DOB:  1945-09-07   PCP:  Pleas Koch, NP  Cardiologist:  No primary care provider on file.  Electrophysiologist:  None   Referring MD: Pleas Koch, NP   Chief Complaint  Patient presents with  . Chest Pain   History of Present Illness:   Brenda Flores is a 75 y.o. female with a hx of non-obstructive CAD, HTN, DM, HLD who is being seen today for the evaluation of chest pain at the request of Pleas Koch, NP.  She reports the last 2 months has had dull achy pain in her left shoulder.  She describes it is constant.  It occurs all day.  It is worse with movement of the shoulder.  She is not able to lift her left shoulder above the right shoulder.  She reports it is worse at night.  She has taken Tylenol and symptoms have improved but not gone away.  She is not taking Tylenol regularly.  She reports no exertional component to it.  It is not worse with exercise or climbing stairs.  She reports no shortness of breath with it either.  She does describe intermittent numbness and tingling in the left hand.  This also occurs with position and is worse at night.  She had a recent echocardiogram performed by her primary care physician that shows normal LV function.  Her EKG in the office demonstrates changes consistent with hypertension.  On my review of her echocardiogram she has mild LVH.  There is no significant mitral valve prolapse but a history of this.  Her LDL cholesterol is 112.  She is diabetic.  She is not on a statin.  We discussed going back on Crestor.  She is not taking aspirin.  She is a never smoker.  She does not drink alcohol or use drugs.  Blood pressure 109/67.  She is on amlodipine and carvedilol.  She does not exercise routinely.  Family history significant for heart disease in her mother and brother.  There is mention of a left heart catheterization in 2007 that  demonstrated nonobstructive CAD.  Problem List 1. Non-obstructive CAD -2007 2. DM -A1c 6.0 3. HLD -T chol 175, HDL 42, LDL 112, TG 105 4. HTN  Past Medical History: Past Medical History:  Diagnosis Date  . Achilles rupture, right   . Bronchitis   . CAD (coronary artery disease) Dr Burt Knack   non-obs CAD by cath in 7/07 (mLAD 30-40%, EF 65%)  . DDD (degenerative disc disease), lumbar   . DJD (degenerative joint disease)    KNEES  . GERD (gastroesophageal reflux disease)   . History of mitral valve prolapse    PER PT REPORTED MVP  PER DR HARSHAW FROM CARDIAC CATH IN 1987 (APPROX)  . 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 Hyperlipidemia   . Hypertension   . Left nephrolithiasis    NON-OBSTRUCTIVE  . Liver, polycystic   . Lumbar radiculopathy   . Osteoporosis   . PONV (postoperative nausea and vomiting)   . Right ureteral stone   . Type 2 diabetes mellitus ([place])     Past Surgical History: Past Surgical History:  Procedure Laterality Date  . ACHILLES TENDON SURGERY Right 05/26/2016   Procedure: RIGHT ACHILLES TENDON REPAIR,CALCANEUS SPUR EXCISION;  Surgeon: Ninetta Lights, MD;  Location: [facility];  Service: Orthopedics;  Laterality: Right;  . CARDIAC CATHETERIZATION  10-17-2005   DR COOPER   NON-OBSTRUCTIVE CAD/   mLAD 30-40%/   NORMAL LVF/  EF 65%  . CARDIOVASCULAR STRESS TEST  04-12-2011  DR Carleene Overlie TAYLOR   LOW RISK STUDY/  NO ISCHEMIA/  EF 68%  . CATARACT EXTRACTION W/ INTRAOCULAR LENS IMPLANT Right 2003  . COLONOSCOPY W/ POLYPECTOMY  03-17-2011  . CYSTOSCOPY WITH URETEROSCOPY Right 06/14/2013   Procedure: RIGHT  URETEROSCOPY,ureteral and urethral dilitation;  Surgeon: Claybon Jabs, MD;  Location: [facility];  Service: Urology;  Laterality: Right;  . HEEL SPUR RESECTION Right 05/26/2016   Procedure: HEEL SPUR EXCISION;  Surgeon: Ninetta Lights, MD;  Location: [place];   Service: Orthopedics;  Laterality: Right;  . HEEL SPUR SURGERY Left 2003  . TOTAL ABDOMINAL HYSTERECTOMY W/ BILATERAL SALPINGOOPHORECTOMY  1978  . TRANSTHORACIC ECHOCARDIOGRAM  04-07-2011   MILD LVH/  MILD FOCAL BASAL SEPTAL HYPERTROPHY/  EF  55-60% /  GRADE I DIASTOLIC DYSFUNCTION    Current Medications: Current Meds  Medication Sig  . albuterol (PROVENTIL) (2.5 MG/3ML) 0.083% nebulizer solution Take 3 mLs (2.5 mg total) by nebulization every 6 (six) hours as needed for wheezing or shortness of breath.  [name] amLODipine (NORVASC) 10 MG tablet Take 1 tablet by mouth every day  . carvedilol (COREG) 6.25 MG tablet Take 1 tablet by mouth twice daily  . ibuprofen (ADVIL) 800 MG tablet Take 1 tablet (800 mg total) by mouth 3 (three) times daily. For 7 days  . JANUVIA 100 MG tablet Take 1 tablet by mouth every day  . rosuvastatin (CRESTOR) 10 MG tablet Take 1 tablet (10 mg total) by mouth daily.  . [DISCONTINUED] atorvastatin (LIPITOR) 20 MG tablet Take 1 tablet by mouth every evening.NEED LAB APPT  . [DISCONTINUED] chlorpheniramine-HYDROcodone (TUSSIONEX PENNKINETIC ER) 10-8 MG/5ML SUER Take 5 mLs by mouth every 12 (twelve) hours as needed for cough.  . [DISCONTINUED] Multiple Vitamin (MULTIVITAMIN WITH MINERALS) TABS tablet Take 1 tablet by mouth daily.  . [DISCONTINUED] TURMERIC PO Take 1 capsule by mouth daily.     Allergies:    Darvocet [propoxyphene n-acetaminophen] and Tetanus toxoids   Social History: Social History   Socioeconomic History  . Marital status: Widowed    Spouse name: Not on file  . Number of children: 2  . Years of education: Not on file  . Highest education level: Not on file  Occupational History  . 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 Smoking status: Never Smoker  . Smokeless tobacco: Never Used  Vaping Use  . Vaping Use: Never used  Substance and Sexual Activity  . Alcohol use: No    Alcohol/week: 0.0 standard drinks  . Drug use: No  . Sexual activity:  Not on file  Other Topics Concern  . Not on file  Social History Narrative  . Not on file   Social Determinants of Health   Financial Resource Strain: Not on file  Food Insecurity: Not on file  Transportation Needs: Not on file  Physical Activity: Not on file  Stress: Not on file  Social Connections: Not on file     Family History: The patient's family history includes Cancer in her brother, father, and mother; Heart disease in her brother, maternal aunt, and mother. There is no history of Breast cancer.  ROS:   All other ROS reviewed and negative. Pertinent positives noted in the HPI.  EKGs/Labs/Other Studies Reviewed:   The following studies were personally reviewed by me today:  EKG:  EKG is ordered today.  The ekg ordered today demonstrates normal sinus rhythm, LVH noted, nonspecific ST-T changes, and was personally reviewed by me.   TTE 03/27/2020 1. Left ventricular ejection fraction, by estimation, is 60 to 65%. The  left ventricle has normal function. The left ventricle has no regional  wall motion abnormalities. There is moderate concentric left ventricular  hypertrophy. Left ventricular  diastolic parameters are consistent with Grade I diastolic dysfunction  (impaired relaxation). Elevated left ventricular end-diastolic pressure.  The average left ventricular global longitudinal strain is -18.5 %. The  global longitudinal strain is normal.  2. Right ventricular systolic function is normal. The right ventricular  size is normal. There is normal pulmonary artery systolic pressure.  3. The mitral valve is normal in structure. No evidence of mitral valve  regurgitation. No evidence of mitral stenosis.  4. The aortic valve is tricuspid. Aortic valve regurgitation is mild. No  aortic stenosis is present.  5. Aortic dilatation noted. There is mild dilatation of the ascending  aorta, measuring 39 mm.  6. The inferior vena cava is normal in size with greater than  50%  respiratory variability, suggesting right atrial pressure of 3 mmHg.   Recent Labs: 08/26/2019: Pro B Natriuretic peptide (BNP) 54.0 12/06/2019: ALT 14 02/27/2020: BUN 14; Creatinine, Ser 0.80; Hemoglobin 12.0; Platelets 207.0; Potassium 3.8; Sodium 143; TSH 1.76   Recent Lipid Panel    Component Value Date/Time   CHOL 175 02/27/2020 0759   TRIG 105.0 02/27/2020 0759   HDL 41.90 02/27/2020 0759   CHOLHDL 4 02/27/2020 0759   VLDL 21.0 02/27/2020 0759   LDLCALC 112 (H) 02/27/2020 0759   LDLDIRECT 195.0 09/26/2018 1439    Physical Exam:   VS:  BP 109/67   Pulse 60   Ht 5' 5.5" (1.664 m)   Wt 198 lb 9.6 oz (90.1 kg)   SpO2 98%   BMI 32.55 kg/m    Wt Readings from Last 3 Encounters:  04/03/20 198 lb 9.6 oz (90.1 kg)  02/27/20 200 lb (90.7 kg)  12/06/19 198 lb (89.8 kg)    General: Well nourished, well developed, in no acute distress Head: Atraumatic, normal size  Eyes: PEERLA, EOMI  Neck: Supple, no JVD Endocrine: No thryomegaly Cardiac: Normal S1, S2; RRR; no murmurs, rubs, or gallops,  Lungs: Clear to auscultation bilaterally, no wheezing, rhonchi or rales  Abd: Soft, nontender, no hepatomegaly  Ext: No edema, pulses 2+ Musculoskeletal: No deformities, tenderness to palpation noticed in the upper outer left chest, tenderness noted in the left shoulder Skin: Warm and dry, no rashes   Neuro: Alert and oriented to person, place, time, and situation, CNII-XII grossly intact, no focal deficits  Psych: Normal mood and affect   ASSESSMENT:   Brenda Flores is a 75 y.o. female who presents for the following: 1. Other chest pain   2. Chronic left shoulder pain   3. Hyperlipidemia, unspecified hyperlipidemia type   4. Hypertension     PLAN:   1. Other chest pain 2. Chronic left shoulder pain -2 months of dull achy left-sided chest pain that is worse in the shoulder.  Worse with movement of the shoulder.  She is noticeably tender to palpation on examination.   Symptoms were improved with Tylenol but did not go away.  She stopped taking further Tylenol.  Recent echocardiogram with normal LV function and mild LVH.  No wall motion abnormalities.  Her EKG today demonstrates LVH without acute ischemic changes. -Overall this is noncardiac chest pain.  I suspect this is musculoskeletal.  It did improve with Tylenol.  I have recommended ibuprofen 800 mg 3 times a day for 7 days with plenty of water.  She should also use heat and ice to the affected area.  She may be developing a frozen shoulder.  She may need physical therapy to improve her symptoms.  She also describes numbness and tingling which also points to musculoskeletal etiology. -We will try the above conservative measures and see her back in 2 months.  If no improvement we may consider coronary CTA given her history of nonobstructive CAD in 2007  3. Hyperlipidemia, unspecified hyperlipidemia type -She is diabetic and had nonobstructive CAD in the past.  She should be on a statin with an LDL less than 70.  Add Crestor 10 mg a day.  4. Hypertension -BP well controlled.  Mild LVH on my review of her echocardiogram.  She should continue amlodipine and Coreg.  Disposition: Return in about 2 months (around 06/01/2020).  Medication Adjustments/Labs and Tests Ordered: Current medicines are reviewed at length with the patient today.  Concerns regarding medicines are outlined above.  Orders Placed This Encounter  Procedures  . EKG 12-Lead   Meds ordered this encounter  Medications  . ibuprofen (ADVIL) 800 MG tablet    Sig: Take 1 tablet (800 mg total) by mouth 3 (three) times daily. For 7 days    Dispense:  21 tablet    Refill:  0  . rosuvastatin (CRESTOR) 10 MG tablet    Sig: Take 1 tablet (10 mg total) by mouth daily.    Dispense:  90 tablet    Refill:  3    Patient Instructions  Medication Instructions:  Start Ibuprofen 800 mg three times a day for 7 days.  Start Crestor 10 mg daily   *If you  need a refill on your cardiac medications before your next appointment, please call your pharmacy*  Follow-Up: At [facility], you and your health needs are our priority.  As part of our continuing mission to provide you with exceptional heart care, we have created designated Provider Care Teams.  These Care Teams include your primary Cardiologist (physician) and Advanced Practice Providers (APPs -  Physician Assistants and Nurse Practitioners) who all work together to provide you with the care you need, when you need it.  We recommend signing up for the patient portal called "MyChart".  Sign up information is provided on this After Visit Summary.  MyChart is used to connect with patients for Virtual Visits (Telemedicine).  Patients are able to view lab/test results, encounter notes, upcoming appointments, etc.  Non-urgent messages can be sent to your provider as well.   To learn more about what you can do with MyChart, go to [email or web address].    Your next appointment:   2 month(s)  The format for your next appointment:   In Person  Provider:   Eleonore Chiquito, MD        Signed, Addison Naegeli. Audie Box, [place]  [address], [place], [place] [address] [phone]  04/03/2020 8:37 AM

## 2020-04-03 ENCOUNTER — Other Ambulatory Visit: Payer: Self-pay

## 2020-04-03 ENCOUNTER — Encounter: Payer: Self-pay | Admitting: Cardiovascular Disease

## 2020-04-03 ENCOUNTER — Ambulatory Visit (INDEPENDENT_AMBULATORY_CARE_PROVIDER_SITE_OTHER): Payer: HMO | Admitting: Cardiovascular Disease

## 2020-04-03 VITALS — BP 109/67 | HR 60 | Ht 65.5 in | Wt 198.6 lb

## 2020-04-03 DIAGNOSIS — G8929 Other chronic pain: Secondary | ICD-10-CM

## 2020-04-03 DIAGNOSIS — E785 Hyperlipidemia, unspecified: Secondary | ICD-10-CM

## 2020-04-03 DIAGNOSIS — R0789 Other chest pain: Secondary | ICD-10-CM | POA: Diagnosis not present

## 2020-04-03 DIAGNOSIS — M25512 Pain in left shoulder: Secondary | ICD-10-CM

## 2020-04-03 DIAGNOSIS — I10 Essential (primary) hypertension: Secondary | ICD-10-CM | POA: Diagnosis not present

## 2020-04-03 DIAGNOSIS — R079 Chest pain, unspecified: Secondary | ICD-10-CM

## 2020-04-03 MED ORDER — ROSUVASTATIN CALCIUM 10 MG PO TABS: 10.0000 mg | ORAL_TABLET | Freq: Every day | ORAL | 3 refills | Status: DC

## 2020-04-03 MED ORDER — IBUPROFEN 800 MG PO TABS: 800.0000 mg | ORAL_TABLET | Freq: Three times a day (TID) | ORAL | 0 refills | Status: DC

## 2020-04-03 NOTE — Patient Instructions (Signed)
Medication Instructions:  Start Ibuprofen 800 mg three times a day for 7 days.  Start Crestor 10 mg daily   *If you need a refill on your cardiac medications before your next appointment, please call your pharmacy*  Follow-Up: At [facility], you and your health needs are our priority.  As part of our continuing mission to provide you with exceptional heart care, we have created designated Provider Care Teams.  These Care Teams include your primary Cardiologist (physician) and Advanced Practice Providers (APPs -  Physician Assistants and Nurse Practitioners) who all work together to provide you with the care you need, when you need it.  We recommend signing up for the patient portal called "MyChart".  Sign up information is provided on this After Visit Summary.  MyChart is used to connect with patients for Virtual Visits (Telemedicine).  Patients are able to view lab/test results, encounter notes, upcoming appointments, etc.  Non-urgent messages can be sent to your provider as well.   To learn more about what you can do with MyChart, go to [email or web address].    Your next appointment:   2 month(s)  The format for your next appointment:   In Person  Provider:   Eleonore Chiquito, MD

## 2020-04-29 ENCOUNTER — Other Ambulatory Visit: Payer: Self-pay | Admitting: Primary Care

## 2020-04-29 DIAGNOSIS — I10 Essential (primary) hypertension: Secondary | ICD-10-CM

## 2020-05-31 NOTE — Progress Notes (Deleted)
Cardiology Office Note:   Date:  05/31/2020  NAME:  Brenda Flores    MRN: [id number] DOB:  September 23, 1945   PCP:  Pleas Koch, NP  Cardiologist:  No primary care provider on file.  Electrophysiologist:  None   Referring MD: Pleas Koch, NP   No chief complaint on file. ***  History of Present Illness:   Brenda Flores is a 75 y.o. female with a hx of non-obstructive CAD, HLD, DM who presents for follow-up. Seen 04/03/2020 for shoulder pain 2/2 MSK issue.    Problem List 1. Non-obstructive CAD -2007 2. DM -A1c 6.0 3. HLD -T chol 175, HDL 42, LDL 112, TG 105 4. HTN  Past Medical History: Past Medical History:  Diagnosis Date  . Achilles rupture, right   . Bronchitis   . CAD (coronary artery disease) Dr Burt Knack   non-obs CAD by cath in 7/07 (mLAD 30-40%, EF 65%)  . DDD (degenerative disc disease), lumbar   . DJD (degenerative joint disease)    KNEES  . GERD (gastroesophageal reflux disease)   . History of mitral valve prolapse    PER PT REPORTED MVP  PER DR HARSHAW FROM CARDIAC CATH IN 1987 (APPROX)  . 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 Hyperlipidemia   . Hypertension   . Left nephrolithiasis    NON-OBSTRUCTIVE  . Liver, polycystic   . Lumbar radiculopathy   . Osteoporosis   . PONV (postoperative nausea and vomiting)   . Right ureteral stone   . Type 2 diabetes mellitus ([employer])     Past Surgical History: Past Surgical History:  Procedure Laterality Date  . ACHILLES TENDON SURGERY Right 05/26/2016   Procedure: RIGHT ACHILLES TENDON REPAIR,CALCANEUS SPUR EXCISION;  Surgeon: Ninetta Lights, MD;  Location: [place];  Service: Orthopedics;  Laterality: Right;  . CARDIAC CATHETERIZATION  10-17-2005   DR COOPER   NON-OBSTRUCTIVE CAD/   mLAD 30-40%/   NORMAL LVF/  EF 65%  . CARDIOVASCULAR STRESS TEST  04-12-2011  DR Carleene Overlie TAYLOR   LOW RISK STUDY/  NO ISCHEMIA/  EF 68%  .  CATARACT EXTRACTION W/ INTRAOCULAR LENS IMPLANT Right 2003  . COLONOSCOPY W/ POLYPECTOMY  03-17-2011  . CYSTOSCOPY WITH URETEROSCOPY Right 06/14/2013   Procedure: RIGHT  URETEROSCOPY,ureteral and urethral dilitation;  Surgeon: Claybon Jabs, MD;  Location[facility];  Service: Urology;  Laterality: Right;  . HEEL SPUR RESECTION Right 05/26/2016   Procedure: HEEL SPUR EXCISION;  Surgeon: Ninetta Lights, MD;  Location: [place];  Service: Orthopedics;  Laterality: Right;  . HEEL SPUR SURGERY Left 2003  . TOTAL ABDOMINAL HYSTERECTOMY W/ BILATERAL SALPINGOOPHORECTOMY  1978  . TRANSTHORACIC ECHOCARDIOGRAM  04-07-2011   MILD LVH/  MILD FOCAL BASAL SEPTAL HYPERTROPHY/  EF  55-60% /  GRADE I DIASTOLIC DYSFUNCTION    Current Medications: No outpatient medications have been marked as taking for the 06/01/20 encounter (Appointment) with Geralynn Rile, MD.     Allergies:    Darvocet [propoxyphene n-acetaminophen] and Tetanus toxoids   Social History: Social History   Socioeconomic History  . Marital status: Widowed    Spouse name: Not on file  . Number of children: 2  . Years of education: Not on file  . Highest education level: Not on file  Occupational History  . 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 Smoking status: Never Smoker  . Smokeless  tobacco: Never Used  Vaping Use  . Vaping Use: Never used  Substance and Sexual Activity  . Alcohol use: No    Alcohol/week: 0.0 standard drinks  . Drug use: No  . Sexual activity: Not on file  Other Topics Concern  . Not on file  Social History Narrative  . Not on file   Social Determinants of Health   Financial Resource Strain: Not on file  Food Insecurity: Not on file  Transportation Needs: Not on file  Physical Activity: Not on file  Stress: Not on file  Social Connections: Not on file     Family History: The patient's ***family history includes Cancer in her brother, father, and mother;  Heart disease in her brother, maternal aunt, and mother. There is no history of Breast cancer.  ROS:   All other ROS reviewed and negative. Pertinent positives noted in the HPI.     EKGs/Labs/Other Studies Reviewed:   The following studies were personally reviewed by me today:  EKG:  EKG is *** ordered today.  The ekg ordered today demonstrates ***, and was personally reviewed by me.   TTE 03/27/2020 1. Left ventricular ejection fraction, by estimation, is 60 to 65%. The  left ventricle has normal function. The left ventricle has no regional  wall motion abnormalities. There is moderate concentric left ventricular  hypertrophy. Left ventricular  diastolic parameters are consistent with Grade I diastolic dysfunction  (impaired relaxation). Elevated left ventricular end-diastolic pressure.  The average left ventricular global longitudinal strain is -18.5 %. The  global longitudinal strain is normal.  2. Right ventricular systolic function is normal. The right ventricular  size is normal. There is normal pulmonary artery systolic pressure.  3. The mitral valve is normal in structure. No evidence of mitral valve  regurgitation. No evidence of mitral stenosis.  4. The aortic valve is tricuspid. Aortic valve regurgitation is mild. No  aortic stenosis is present.  5. Aortic dilatation noted. There is mild dilatation of the ascending  aorta, measuring 39 mm.  6. The inferior vena cava is normal in size with greater than 50%  respiratory variability, suggesting right atrial pressure of 3 mmHg.   Recent Labs: 08/26/2019: Pro B Natriuretic peptide (BNP) 54.0 12/06/2019: ALT 14 02/27/2020: BUN 14; Creatinine, Ser 0.80; Hemoglobin 12.0; Platelets 207.0; Potassium 3.8; Sodium 143; TSH 1.76   Recent Lipid Panel    Component Value Date/Time   CHOL 175 02/27/2020 0759   TRIG 105.0 02/27/2020 0759   HDL 41.90 02/27/2020 0759   CHOLHDL 4 02/27/2020 0759   VLDL 21.0 02/27/2020 0759   LDLCALC  112 (H) 02/27/2020 0759   LDLDIRECT 195.0 09/26/2018 1439    Physical Exam:   VS:  There were no vitals taken for this visit.   Wt Readings from Last 3 Encounters:  04/03/20 198 lb 9.6 oz (90.1 kg)  02/27/20 200 lb (90.7 kg)  12/06/19 198 lb (89.8 kg)    General: Well nourished, well developed, in no acute distress Head: Atraumatic, normal size  Eyes: PEERLA, EOMI  Neck: Supple, no JVD Endocrine: No thryomegaly Cardiac: Normal S1, S2; RRR; no murmurs, rubs, or gallops Lungs: Clear to auscultation bilaterally, no wheezing, rhonchi or rales  Abd: Soft, nontender, no hepatomegaly  Ext: No edema, pulses 2+ Musculoskeletal: No deformities, BUE and BLE strength normal and equal Skin: Warm and dry, no rashes   Neuro: Alert and oriented to person, place, time, and situation, CNII-XII grossly intact, no focal deficits  Psych: Normal mood  and affect   ASSESSMENT:   Brenda Flores is a 75 y.o. female who presents for the following: No diagnosis found.  PLAN:   There are no diagnoses linked to this encounter.  Disposition: No follow-ups on file.  Medication Adjustments/Labs and Tests Ordered: Current medicines are reviewed at length with the patient today.  Concerns regarding medicines are outlined above.  No orders of the defined types were placed in this encounter.  No orders of the defined types were placed in this encounter.   There are no Patient Instructions on file for this visit.   Time Spent with Patient: I have spent a total of *** minutes with patient reviewing hospital notes, telemetry, EKGs, labs and examining the patient as well as establishing an assessment and plan that was discussed with the patient.  > 50% of time was spent in direct patient care.  Signed, Addison Naegeli. Audie Box, MD, [place]  [address], [place], [place] [address] [phone]  05/31/2020 3:59 PM

## 2020-06-01 ENCOUNTER — Ambulatory Visit: Payer: Medicare Other | Admitting: Cardiovascular Disease

## 2020-06-01 DIAGNOSIS — E785 Hyperlipidemia, unspecified: Secondary | ICD-10-CM

## 2020-06-01 DIAGNOSIS — R0789 Other chest pain: Secondary | ICD-10-CM

## 2020-06-01 DIAGNOSIS — G8929 Other chronic pain: Secondary | ICD-10-CM

## 2020-06-23 ENCOUNTER — Telehealth: Payer: Self-pay | Admitting: Primary Care

## 2020-06-23 NOTE — Telephone Encounter (Signed)
Pt called in wanted to know about getting a refill on her medication. And she changed insurance and it has to go to [facility]

## 2020-06-24 ENCOUNTER — Other Ambulatory Visit: Payer: Self-pay

## 2020-06-24 DIAGNOSIS — E119 Type 2 diabetes mellitus without complications: Secondary | ICD-10-CM

## 2020-06-24 DIAGNOSIS — I10 Essential (primary) hypertension: Secondary | ICD-10-CM

## 2020-06-24 MED ORDER — SITAGLIPTIN PHOSPHATE 100 MG PO TABS: 100.0000 mg | ORAL_TABLET | Freq: Every day | ORAL | 0 refills | Status: DC

## 2020-06-24 MED ORDER — AMLODIPINE BESYLATE 10 MG PO TABS: 1.0000 | ORAL_TABLET | Freq: Every day | ORAL | 0 refills | Status: DC

## 2020-06-24 MED ORDER — CARVEDILOL 6.25 MG PO TABS: 6.2500 mg | ORAL_TABLET | Freq: Two times a day (BID) | ORAL | 0 refills | Status: DC

## 2020-06-24 NOTE — Telephone Encounter (Signed)
Refill sent in as requested. 

## 2020-06-24 NOTE — Telephone Encounter (Signed)
Sorry the medication that she is needing refilled is amlodipine, coreg, [place] . And they need to go to [facility] due to the insurance change.

## 2020-06-24 NOTE — Telephone Encounter (Signed)
Do you know what she wanted filled?

## 2020-08-19 ENCOUNTER — Ambulatory Visit: Payer: Medicare Other | Admitting: Cardiovascular Disease

## 2020-08-21 ENCOUNTER — Other Ambulatory Visit: Payer: HMO

## 2020-08-21 ENCOUNTER — Ambulatory Visit: Payer: HMO

## 2020-08-25 ENCOUNTER — Telehealth: Payer: Self-pay

## 2020-08-25 NOTE — Telephone Encounter (Signed)
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 Pt is not having symptoms and is asking what she should do. She has been vaccinated but not boosted. I advised her to wear a mask and social distance since she has had active exposure even though she does not have symptoms. She has an appt with Anda Kraft 08/27/20. I advised her that if she starts to have symptoms to let us know. She asked if it could be virtual. I advised her to let us know late tomorrow if she is symptomatic and decide then if the appt needs to be virtual or rescheduled. Will send to [place] as an [place].

## 2020-08-25 NOTE — Telephone Encounter (Signed)
Left message to return call to our office.  

## 2020-08-25 NOTE — Telephone Encounter (Signed)
The appointment scheduled is for a diabetes check. Let's reschedule her and see how she's feeling over the next week. With an active exposure it puts others at risk. Agree with masking, social distancing, monitoring symptoms.   If she develops symptoms then have her contact us.

## 2020-08-26 NOTE — Telephone Encounter (Signed)
Called 2 more times number busy

## 2020-08-26 NOTE — Telephone Encounter (Signed)
Pt left v/m returning call and pt request cb. 

## 2020-08-26 NOTE — Telephone Encounter (Signed)
Left message to return call to our office.  

## 2020-08-27 ENCOUNTER — Ambulatory Visit: Payer: Medicare Other | Admitting: Primary Care

## 2020-09-01 NOTE — Telephone Encounter (Signed)
Patient was called and appointment moved

## 2020-09-04 ENCOUNTER — Encounter: Payer: Self-pay | Admitting: Primary Care

## 2020-09-04 ENCOUNTER — Other Ambulatory Visit: Payer: Self-pay

## 2020-09-04 ENCOUNTER — Ambulatory Visit (INDEPENDENT_AMBULATORY_CARE_PROVIDER_SITE_OTHER): Payer: Medicare Other | Admitting: Primary Care

## 2020-09-04 VITALS — BP 110/62 | HR 68 | Temp 98.7°F | Ht 65.5 in | Wt 190.0 lb

## 2020-09-04 DIAGNOSIS — G8929 Other chronic pain: Secondary | ICD-10-CM | POA: Diagnosis not present

## 2020-09-04 DIAGNOSIS — M25552 Pain in left hip: Secondary | ICD-10-CM | POA: Diagnosis not present

## 2020-09-04 DIAGNOSIS — M25551 Pain in right hip: Secondary | ICD-10-CM

## 2020-09-04 DIAGNOSIS — I2510 Atherosclerotic heart disease of native coronary artery without angina pectoris: Secondary | ICD-10-CM | POA: Diagnosis not present

## 2020-09-04 DIAGNOSIS — E119 Type 2 diabetes mellitus without complications: Secondary | ICD-10-CM

## 2020-09-04 LAB — POCT GLYCOSYLATED HEMOGLOBIN (HGB A1C): Hemoglobin A1C: 5.9 % — AB (ref 4.0–5.6)

## 2020-09-04 MED ORDER — SITAGLIPTIN PHOSPHATE 50 MG PO TABS: 50.0000 mg | ORAL_TABLET | Freq: Every day | ORAL | 3 refills | Status: DC

## 2020-09-04 NOTE — Assessment & Plan Note (Signed)
Chronic for years, progressing. Referral placed for physical therapy. She will update.

## 2020-09-04 NOTE — Patient Instructions (Signed)
We reduced the dose of your sitagiptan (Januvia) to 50 mg. This is for diabetes.  You will be contacted regarding your referral to physical therapy for your hips.  Please let us know if you have not been contacted within two weeks.   We will see you in December for your physical.  It was a pleasure to see you today!

## 2020-09-04 NOTE — Assessment & Plan Note (Signed)
Well controlled with A1C of 5.9 today.  Will reduce Januvia to 50 mg.  Managed on statin. Urine microalbumin UTD.  Follow up in 6 months.

## 2020-09-04 NOTE — Progress Notes (Signed)
Subjective:    Patient ID: Brenda Flores, female    DOB: 1945/07/26, 75 y.o.   MRN: [id number]  HPI  Brenda Flores is a very pleasant 75 y.o. female with a history of hypertension, CAD, heart failure, type 2 diabetes, hyperlipidemia who presents today for follow up of diabetes and chronic hip pain.  1) Type 2 Diabetes:  Current medications include: Januvia 100 mg  She is checking her blood glucose 0 times daily.   Last A1C: 6.0 in December 2021, 5.9 today. Last Eye Exam: Due Last Foot Exam: UTD Pneumonia Vaccination: 2018 Urine Microalbumin: UTD Statin: Crestor 10 mg  Dietary changes since last visit: None.    Exercise: She is not exercising due to chronic hip pain when walking.   BP Readings from Last 3 Encounters:  09/04/20 110/62  04/03/20 109/67  02/27/20 118/73    2) Chronic Hip Pain: Chronic for years, located to the bilateral hips which bother her only when walking. Pain begins about 20 minutes after walking. Pain bothers her when walking on cement, grocery shopping, any shopping.     Review of Systems  Respiratory:         Chronic dyspnea since Covid-19  Cardiovascular:  Negative for chest pain.  Musculoskeletal:  Positive for arthralgias.       Chronic hip pain  Neurological:  Positive for numbness.        Past Medical History:  Diagnosis Date   Achilles rupture, right    Bronchitis    CAD (coronary artery disease) Dr Burt Knack   non-obs CAD by cath in 7/07 (mLAD 30-40%, EF 65%)   DDD (degenerative disc disease), lumbar    DJD (degenerative joint disease)    KNEES   GERD (gastroesophageal reflux disease)    History of mitral valve prolapse    PER PT REPORTED MVP  PER DR HARSHAW FROM CARDIAC CATH IN 1987 (APPROX)   History of rectal polyps    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   Hyperlipidemia    Hypertension    Left nephrolithiasis    NON-OBSTRUCTIVE   Liver, polycystic    Lumbar radiculopathy     Osteoporosis    PONV (postoperative nausea and vomiting)    Right ureteral stone    Type 2 diabetes mellitus ([place])     Social History   Socioeconomic History   Marital status: Widowed    Spouse name: Not on file   Number of children: 2   Years of education: Not on file   Highest education level: Not on file  Occupational History   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  Tobacco Use   Smoking status: Never   Smokeless tobacco: Never  Vaping Use   Vaping Use: Never used  Substance and Sexual Activity   Alcohol use: No    Alcohol/week: 0.0 standard drinks   Drug use: No   Sexual activity: Not on file  Other Topics Concern   Not on file  Social History Narrative   Not on file   Social Determinants of Health   Financial Resource Strain: Not on file  Food Insecurity: Not on file  Transportation Needs: Not on file  Physical Activity: Not on file  Stress: Not on file  Social Connections: Not on file  Intimate Partner Violence: Not on file    Past Surgical History:  Procedure Laterality Date   ACHILLES TENDON SURGERY Right 05/26/2016   Procedure: RIGHT ACHILLES [place];  Surgeon: Ninetta Lights,  MD;  Location: [place];  Service: Orthopedics;  Laterality: Right;   CARDIAC CATHETERIZATION  10-17-2005   DR COOPER   NON-OBSTRUCTIVE CAD/   mLAD 30-40%/   NORMAL LVF/  EF 65%   CARDIOVASCULAR STRESS TEST  04-12-2011  DR Carleene Overlie TAYLOR   LOW RISK STUDY/  NO ISCHEMIA/  EF 68%   CATARACT EXTRACTION W/ INTRAOCULAR LENS IMPLANT Right 2003   COLONOSCOPY W/ POLYPECTOMY  03-17-2011   CYSTOSCOPY WITH URETEROSCOPY Right 06/14/2013   Procedure: RIGHT  URETEROSCOPY,ureteral and urethral dilitation;  Surgeon: Claybon Jabs, MD;  Location[facility];  Service: Urology;  Laterality: Right;   HEEL SPUR RESECTION Right 05/26/2016   Procedure: HEEL SPUR EXCISION;  Surgeon: Ninetta Lights, MD;  Location: [place];  Service: Orthopedics;   Laterality: Right;   HEEL SPUR SURGERY Left 2003   TOTAL ABDOMINAL HYSTERECTOMY W/ BILATERAL SALPINGOOPHORECTOMY  1978   TRANSTHORACIC ECHOCARDIOGRAM  04-07-2011   MILD LVH/  MILD FOCAL BASAL SEPTAL HYPERTROPHY/  EF  55-60% /  GRADE I DIASTOLIC DYSFUNCTION    Family History  Problem Relation Age of Onset   Cancer Mother        breast   Heart disease Mother    Cancer Father        colon   Cancer Brother        ?lung   Heart disease Brother    Heart disease Maternal Aunt    Breast cancer Neg Hx     Allergies  Allergen Reactions   Darvocet [Propoxyphene N-Acetaminophen] Nausea And Vomiting   Propoxyphene Other (See Comments)   Tetanus Toxoids     TDAP hives, pt reports she had TD in the past w/ no issues    Current Outpatient Medications on File Prior to Visit  Medication Sig Dispense Refill   albuterol (PROVENTIL) (2.5 MG/3ML) 0.083% nebulizer solution Take 3 mLs (2.5 mg total) by nebulization every 6 (six) hours as needed for wheezing or shortness of breath. 150 mL 1   amLODipine (NORVASC) 10 MG tablet Take 1 tablet (10 mg total) by mouth daily. For blood pressure 90 tablet 0   carvedilol (COREG) 6.25 MG tablet Take 1 tablet (6.25 mg total) by mouth 2 (two) times daily. 180 tablet 0   ibuprofen (ADVIL) 800 MG tablet Take 1 tablet (800 mg total) by mouth 3 (three) times daily. For 7 days 21 tablet 0   sitaGLIPtin (JANUVIA) 100 MG tablet Take 1 tablet (100 mg total) by mouth daily. For Diabetes 90 tablet 0   rosuvastatin (CRESTOR) 10 MG tablet Take 1 tablet (10 mg total) by mouth daily. (Patient not taking: Reported on 09/04/2020) 90 tablet 3   No current facility-administered medications on file prior to visit.    BP 110/62   Pulse 68   Temp 98.7 F (37.1 C) (Temporal)   Ht 5' 5.5" (1.664 m)   Wt 190 lb (86.2 kg)   SpO2 97%   BMI 31.14 kg/m  Objective:   Physical Exam Cardiovascular:     Rate and Rhythm: Normal rate and regular rhythm.  Pulmonary:     Effort:  Pulmonary effort is normal.     Breath sounds: Normal breath sounds.  Musculoskeletal:        General: Normal range of motion.     Cervical back: Neck supple.     Comments: Able to get up and down from exam table without difficulty.  Ambulatory in clinic without difficulty.  Skin:    General: Skin is warm and dry.          Assessment & Plan:      This visit occurred during the SARS-CoV-2 public health emergency.  Safety protocols were in place, including screening questions prior to the visit, additional usage of staff PPE, and extensive cleaning of exam room while observing appropriate contact time as indicated for disinfecting solutions.

## 2020-09-22 ENCOUNTER — Other Ambulatory Visit: Payer: Self-pay | Admitting: Primary Care

## 2020-09-22 DIAGNOSIS — I10 Essential (primary) hypertension: Secondary | ICD-10-CM

## 2020-09-26 ENCOUNTER — Other Ambulatory Visit: Payer: Self-pay | Admitting: Primary Care

## 2020-09-26 DIAGNOSIS — E119 Type 2 diabetes mellitus without complications: Secondary | ICD-10-CM

## 2020-11-04 NOTE — Progress Notes (Deleted)
Cardiology Office Note:   Date:  11/04/2020  NAME:  Brenda Flores    MRN: [id number] DOB:  10/26/1945   PCP:  Pleas Koch, NP  Cardiologist:  None  Electrophysiologist:  None   Referring MD: Pleas Koch, NP   No chief complaint on file. ***  History of Present Illness:   Brenda Flores is a 75 y.o. female with a hx of non-obstructive CAD, DM, HLD who presents for follow-up of chest pain. Seen for shoulder pain in L shoulder. Symptoms were more consistent with MSK pain. Given ibuprofen and follow-up today.   Problem List 1. Non-obstructive CAD -2007 2. DM -A1c 5.9 3. HLD -T chol 175, HDL 42, LDL 112, TG 105 4. HTN  Past Medical History: Past Medical History:  Diagnosis Date   Achilles rupture, right    Bronchitis    CAD (coronary artery disease) Dr Burt Knack   non-obs CAD by cath in 7/07 (mLAD 30-40%, EF 65%)   DDD (degenerative disc disease), lumbar    DJD (degenerative joint disease)    KNEES   GERD (gastroesophageal reflux disease)    History of mitral valve prolapse    PER PT REPORTED MVP  PER DR HARSHAW FROM CARDIAC CATH IN 1987 (APPROX)   History of rectal polyps    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   Hyperlipidemia    Hypertension    Left nephrolithiasis    NON-OBSTRUCTIVE   Liver, polycystic    Lumbar radiculopathy    Osteoporosis    PONV (postoperative nausea and vomiting)    Right ureteral stone    Type 2 diabetes mellitus ([place])     Past Surgical History: Past Surgical History:  Procedure Laterality Date   ACHILLES TENDON SURGERY Right 05/26/2016   Procedure: RIGHT ACHILLES TENDON REPAIR,CALCANEUS SPUR EXCISION;  Surgeon: Ninetta Lights, MD;  Location: [place];  Service: Orthopedics;  Laterality: Right;   CARDIAC CATHETERIZATION  10-17-2005   DR COOPER   NON-OBSTRUCTIVE CAD/   mLAD 30-40%/   NORMAL LVF/  EF 65%   CARDIOVASCULAR STRESS TEST  04-12-2011  DR Carleene Overlie TAYLOR   LOW  RISK STUDY/  NO ISCHEMIA/  EF 68%   CATARACT EXTRACTION W/ INTRAOCULAR LENS IMPLANT Right 2003   COLONOSCOPY W/ POLYPECTOMY  03-17-2011   CYSTOSCOPY WITH URETEROSCOPY Right 06/14/2013   Procedure: RIGHT  URETEROSCOPY,ureteral and urethral dilitation;  Surgeon: Claybon Jabs, MD;  Location: [facility];  Service: Urology;  Laterality: Right;   HEEL SPUR RESECTION Right 05/26/2016   Procedure: HEEL SPUR EXCISION;  Surgeon: Ninetta Lights, MD;  Location: [place];  Service: Orthopedics;  Laterality: Right;   HEEL SPUR SURGERY Left 2003   TOTAL ABDOMINAL HYSTERECTOMY W/ BILATERAL SALPINGOOPHORECTOMY  1978   TRANSTHORACIC ECHOCARDIOGRAM  04-07-2011   MILD LVH/  MILD FOCAL BASAL SEPTAL HYPERTROPHY/  EF  55-60% /  GRADE I DIASTOLIC DYSFUNCTION    Current Medications: No outpatient medications have been marked as taking for the 11/05/20 encounter (Appointment) with Geralynn Rile, MD.     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  Tobacco Use  Smoking status: Never   Smokeless tobacco: Never  Vaping Use   Vaping Use: Never used  Substance and Sexual Activity   Alcohol use: No    Alcohol/week: 0.0 standard drinks   Drug use: No   Sexual activity: Not on file  Other Topics Concern   Not on file  Social History Narrative   Not on file   Social Determinants of Health   Financial Resource Strain: Not on file  Food Insecurity: Not on file  Transportation Needs: Not on file  Physical Activity: Not on file  Stress: Not on file  Social Connections: Not on file     Family History: The patient's ***family history includes Cancer in her brother,  father, and mother; Heart disease in her brother, maternal aunt, and mother. There is no history of Breast cancer.  ROS:   All other ROS reviewed and negative. Pertinent positives noted in the HPI.     EKGs/Labs/Other Studies Reviewed:   The following studies were personally reviewed by me today:  EKG:  EKG is *** ordered today.  The ekg ordered today demonstrates ***, and was personally reviewed by me.   Recent Labs: 12/06/2019: ALT 14 02/27/2020: BUN 14; Creatinine, Ser 0.80; Hemoglobin 12.0; Platelets 207.0; Potassium 3.8; Sodium 143; TSH 1.76   Recent Lipid Panel    Component Value Date/Time   CHOL 175 02/27/2020 0759   TRIG 105.0 02/27/2020 0759   HDL 41.90 02/27/2020 0759   CHOLHDL 4 02/27/2020 0759   VLDL 21.0 02/27/2020 0759   LDLCALC 112 (H) 02/27/2020 0759   LDLDIRECT 195.0 09/26/2018 1439    Physical Exam:   VS:  There were no vitals taken for this visit.   Wt Readings from Last 3 Encounters:  09/04/20 190 lb (86.2 kg)  04/03/20 198 lb 9.6 oz (90.1 kg)  02/27/20 200 lb (90.7 kg)    General: Well nourished, well developed, in no acute distress Head: Atraumatic, normal size  Eyes: PEERLA, EOMI  Neck: Supple, no JVD Endocrine: No thryomegaly Cardiac: Normal S1, S2; RRR; no murmurs, rubs, or gallops Lungs: Clear to auscultation bilaterally, no wheezing, rhonchi or rales  Abd: Soft, nontender, no hepatomegaly  Ext: No edema, pulses 2+ Musculoskeletal: No deformities, BUE and BLE strength normal and equal Skin: Warm and dry, no rashes   Neuro: Alert and oriented to person, place, time, and situation, CNII-XII grossly intact, no focal deficits  Psych: Normal mood and affect   ASSESSMENT:   Brenda Flores is a 75 y.o. female who presents for the following: No diagnosis found.  PLAN:   There are no diagnoses linked to this encounter.  {Are you ordering a CV Procedure (e.g. stress test, cath, DCCV, TEE, etc)?   Press F2        :[id number]  Disposition:  No follow-ups on file.  Medication Adjustments/Labs and Tests Ordered: Current medicines are reviewed at length with the patient today.  Concerns regarding medicines are outlined above.  No orders of the defined types were placed in this encounter.  No orders of the defined types were placed in this encounter.   There are no Patient Instructions on file for this visit.   Time Spent with Patient: I have spent a total of *** minutes with patient reviewing hospital notes, telemetry, EKGs, labs and examining the patient as well as establishing an assessment and plan that was discussed with the patient.  > 50% of time was spent in direct patient care.  Signed, Addison Naegeli. O'Neal,  MD, [facility] [facility]  [address], [place], [place] [address] [phone]  11/04/2020 7:12 PM

## 2020-11-05 ENCOUNTER — Ambulatory Visit: Payer: Medicare Other | Admitting: Cardiovascular Disease

## 2020-11-05 DIAGNOSIS — I10 Essential (primary) hypertension: Secondary | ICD-10-CM

## 2020-11-05 DIAGNOSIS — G8929 Other chronic pain: Secondary | ICD-10-CM

## 2020-11-05 DIAGNOSIS — E785 Hyperlipidemia, unspecified: Secondary | ICD-10-CM

## 2020-11-05 DIAGNOSIS — R0789 Other chest pain: Secondary | ICD-10-CM

## 2020-12-15 ENCOUNTER — Telehealth: Payer: Self-pay | Admitting: Primary Care

## 2020-12-15 NOTE — Telephone Encounter (Signed)
Pt called wanting to know was it ok for her to take sea moss with her other medication. And also pt would like to know if [facility] could schedule her for a colonoscopy, breast exam and a foot doctor. Pt states that she work around a heavy smoker and would like for [facility] to order her an inhaler.

## 2020-12-16 NOTE — Telephone Encounter (Signed)
Called Brenda Flores and got her scheduled for 10/4 @3 

## 2020-12-16 NOTE — Telephone Encounter (Signed)
Noted  

## 2020-12-16 NOTE — Telephone Encounter (Signed)
Please schedule an appointment for patient.

## 2020-12-22 ENCOUNTER — Ambulatory Visit: Payer: Medicare Other | Admitting: Primary Care

## 2020-12-29 ENCOUNTER — Encounter: Payer: Self-pay | Admitting: Primary Care

## 2020-12-29 ENCOUNTER — Ambulatory Visit (INDEPENDENT_AMBULATORY_CARE_PROVIDER_SITE_OTHER): Payer: Medicare Other

## 2020-12-29 ENCOUNTER — Other Ambulatory Visit: Payer: Self-pay

## 2020-12-29 ENCOUNTER — Ambulatory Visit (INDEPENDENT_AMBULATORY_CARE_PROVIDER_SITE_OTHER): Payer: Medicare Other | Admitting: Primary Care

## 2020-12-29 VITALS — BP 118/68 | HR 82 | Temp 98.7°F | Ht 65.5 in | Wt 196.0 lb

## 2020-12-29 DIAGNOSIS — I2510 Atherosclerotic heart disease of native coronary artery without angina pectoris: Secondary | ICD-10-CM

## 2020-12-29 DIAGNOSIS — Z1211 Encounter for screening for malignant neoplasm of colon: Secondary | ICD-10-CM

## 2020-12-29 DIAGNOSIS — M25552 Pain in left hip: Secondary | ICD-10-CM

## 2020-12-29 DIAGNOSIS — Z23 Encounter for immunization: Secondary | ICD-10-CM

## 2020-12-29 DIAGNOSIS — Z800 Family history of malignant neoplasm of digestive organs: Secondary | ICD-10-CM

## 2020-12-29 DIAGNOSIS — R519 Headache, unspecified: Secondary | ICD-10-CM | POA: Diagnosis not present

## 2020-12-29 DIAGNOSIS — M25551 Pain in right hip: Secondary | ICD-10-CM

## 2020-12-29 DIAGNOSIS — R600 Localized edema: Secondary | ICD-10-CM

## 2020-12-29 DIAGNOSIS — G8929 Other chronic pain: Secondary | ICD-10-CM

## 2020-12-29 HISTORY — DX: Headache, unspecified: R51.9

## 2020-12-29 NOTE — Assessment & Plan Note (Signed)
Noted to left foot only today.  Symptoms could be secondary to venous insufficiency, especially since symptoms resolve with elevation and in the morning.  Consider switching amlodipine to ARB. Checking BNP, BMP today. Await results.

## 2020-12-29 NOTE — Progress Notes (Signed)
Subjective:    Patient ID: Brenda Flores, female    DOB: 06-Sep-1945, 75 y.o.   MRN: [id number]  HPI  Brenda Flores is a very pleasant 75 y.o. female with a history of hypertension, CAD, CHF, type 2 diabetes, chronic back/neck/shoulder/foot pain who presents today to discuss several issues.   1) Chronic Hip Pain:   Chronic to the bilateral hips for years. Pain occurs with walking and sleeping, interferes with shopping or anything involving walking, especially on cement. Pain will wake her from sleep.   She was at [employer], could only walk for about 30 minutes before she had to sit in a wheelchair. During her visit in June 2022 she was referred to physical therapy for ongoing chronic hip pain, she never proceeded as she thought it would make symptoms worse. She takes 600-800 mg of Advil nightly due to pain.   2) Temporal Headache:   Acute, left temporal headache for which she describes as sharp/shock like lasting a few seconds, occurring sporadically regardless of rest or movement. Acute for the last 2 months. She denies rash, vision involvement, unilateral weakness, changes in speech. She has an eye appointment scheduled for next month.   3) Family History of Colon Cancer:   She is overdue for a colonoscopy. Strong family history of colon cancer in her father and sister. Last colonoscopy was 8-9 years ago, was getting these every 2 years. She denies unexplained weight loss, changes in bowels, bloody stools.   4) Lower Extremity Edema:   History of mild CHF. She's also noticed left foot swelling without ankle involvement for the last 2-3 months. She notices improvement and near resolve in swelling when she elevates her leg or when waking in the morning. She is managed on amlodipine 10 mg daily for hypertension. She denies long travel, warmth, pain.   BP Readings from Last 3 Encounters:  12/29/20 118/68  09/04/20 110/62  04/03/20 109/67      Review of Systems   Constitutional:  Negative for unexpected weight change.  Respiratory:  Negative for shortness of breath.   Cardiovascular:  Positive for leg swelling. Negative for chest pain.  Gastrointestinal:  Negative for blood in stool.  Musculoskeletal:  Positive for arthralgias.  Skin:  Negative for color change.  Neurological:  Positive for headaches.        Past Medical History:  Diagnosis Date   Achilles rupture, right    Bronchitis    CAD (coronary artery disease) Dr Burt Knack   non-obs CAD by cath in 7/07 (mLAD 30-40%, EF 65%)   DDD (degenerative disc disease), lumbar    DJD (degenerative joint disease)    KNEES   GERD (gastroesophageal reflux disease)    History of mitral valve prolapse    PER PT REPORTED MVP  PER DR HARSHAW FROM CARDIAC CATH IN 1987 (APPROX)   History of rectal polyps    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   Hyperlipidemia    Hypertension    Left nephrolithiasis    NON-OBSTRUCTIVE   Liver, polycystic    Lumbar radiculopathy    Osteoporosis    PONV (postoperative nausea and vomiting)    Right ureteral stone    Type 2 diabetes mellitus ([place])     Social History   Socioeconomic History   Marital status: Widowed    Spouse name: Not on file   Number of children: 2   Years of education: Not on file   Highest education level: Not  on file  Occupational History   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  Tobacco Use   Smoking status: Never   Smokeless tobacco: Never  Vaping Use   Vaping Use: Never used  Substance and Sexual Activity   Alcohol use: No    Alcohol/week: 0.0 standard drinks   Drug use: No   Sexual activity: Not on file  Other Topics Concern   Not on file  Social History Narrative   Not on file   Social Determinants of Health   Financial Resource Strain: Not on file  Food Insecurity: Not on file  Transportation Needs: Not on file  Physical Activity: Not on file  Stress: Not on file  Social Connections: Not on file  Intimate  Partner Violence: Not on file    Past Surgical History:  Procedure Laterality Date   ACHILLES TENDON SURGERY Right 05/26/2016   Procedure: RIGHT ACHILLES [place];  Surgeon: Ninetta Lights, MD;  Location: [place];  Service: Orthopedics;  Laterality: Right;   CARDIAC CATHETERIZATION  10-17-2005   DR COOPER   NON-OBSTRUCTIVE CAD/   mLAD 30-40%/   NORMAL LVF/  EF 65%   CARDIOVASCULAR STRESS TEST  04-12-2011  DR Carleene Overlie TAYLOR   LOW RISK STUDY/  NO ISCHEMIA/  EF 68%   CATARACT EXTRACTION W/ INTRAOCULAR LENS IMPLANT Right 2003   COLONOSCOPY W/ POLYPECTOMY  03-17-2011   CYSTOSCOPY WITH URETEROSCOPY Right 06/14/2013   Procedure: RIGHT  URETEROSCOPY,ureteral and urethral dilitation;  Surgeon: Claybon Jabs, MD;  [facility];  Service: Urology;  Laterality: Right;   HEEL SPUR RESECTION Right 05/26/2016   Procedure: HEEL SPUR EXCISION;  Surgeon: Ninetta Lights, MD;  Location: [place];  Service: Orthopedics;  Laterality: Right;   HEEL SPUR SURGERY Left 2003   TOTAL ABDOMINAL HYSTERECTOMY W/ BILATERAL SALPINGOOPHORECTOMY  1978   TRANSTHORACIC ECHOCARDIOGRAM  04-07-2011   MILD LVH/  MILD FOCAL BASAL SEPTAL HYPERTROPHY/  EF  55-60% /  GRADE I DIASTOLIC DYSFUNCTION    Family History  Problem Relation Age of Onset   Cancer Mother        breast   Heart disease Mother    Cancer Father        colon   Cancer Brother        ?lung   Heart disease Brother    Heart disease Maternal Aunt    Breast cancer Neg Hx     Allergies  Allergen Reactions   Darvocet [Propoxyphene N-Acetaminophen] Nausea And Vomiting   Propoxyphene Other (See Comments)   Tetanus Toxoids     TDAP hives, pt reports she had TD in the past w/ no issues    Current Outpatient Medications on File Prior to Visit  Medication Sig Dispense Refill   albuterol (PROVENTIL) (2.5 MG/3ML) 0.083% nebulizer solution Take 3 mLs (2.5 mg total) by nebulization  every 6 (six) hours as needed for wheezing or shortness of breath. 150 mL 1   amLODipine (NORVASC) 10 MG tablet TAKE 1 TABLET BY MOUTH EVERY DAY FOR BLOOD PRESSURE 90 tablet 1   carvedilol (COREG) 6.25 MG tablet Take 1 tablet (6.25 mg total) by mouth 2 (two) times daily. For blood pressure. 180 tablet 1   ibuprofen (ADVIL) 800 MG tablet Take 1 tablet (800 mg total) by mouth 3 (three) times daily. For 7 days 21 tablet 0   sitaGLIPtin (JANUVIA) 50 MG tablet Take 1 tablet (50 mg total) by mouth daily. For diabetes. 90 tablet  3   rosuvastatin (CRESTOR) 10 MG tablet Take 1 tablet (10 mg total) by mouth daily. (Patient not taking: Reported on 09/04/2020) 90 tablet 3   No current facility-administered medications on file prior to visit.    BP 118/68   Pulse 82   Temp 98.7 F (37.1 C) (Temporal)   Ht 5' 5.5" (1.664 m)   Wt 196 lb (88.9 kg)   SpO2 97%   BMI 32.12 kg/m  Objective:   Physical Exam Cardiovascular:     Rate and Rhythm: Normal rate and regular rhythm.     Comments: Moderate swelling to left foot without ankle involvement. No pitting. Pulmonary:     Effort: Pulmonary effort is normal.     Breath sounds: Normal breath sounds.  Musculoskeletal:     Cervical back: Neck supple.     Comments: Normal ROM to bilateral hips, but she does have movement pain.  Right hip with flexion, external, and internal movement pain. Left hip with external hip movement pain.  Skin:    General: Skin is warm and dry.  Neurological:     Mental Status: She is oriented to person, place, and time.     Cranial Nerves: No cranial nerve deficit.     Comments: No pain to left temporal region with palpation           Assessment & Plan:      This visit occurred during the SARS-CoV-2 public health emergency.  Safety protocols were in place, including screening questions prior to the visit, additional usage of staff PPE, and extensive cleaning of exam room while observing appropriate contact time as  indicated for disinfecting solutions.

## 2020-12-29 NOTE — Patient Instructions (Signed)
Complete xray(s) and lab prior to leaving today. I will notify you of your results once received.  You will be contacted regarding your referral to GI for the colonoscopy and for the MRI of your head.  Please let us know if you have not been contacted within two weeks.   It was a pleasure to see you today!

## 2020-12-29 NOTE — Assessment & Plan Note (Signed)
Patient never went through physical therapy.  Checking plain films of bilateral hips today. Consider physical therapy versus orthopedic evaluation. Await results.

## 2020-12-29 NOTE — Assessment & Plan Note (Signed)
Suspicious for trigeminal neuralgia. No signs of herpes zoster.  She will proceed with her eye exam as scheduled next month.  Checking sed rate today. MRI brain ordered.

## 2020-12-29 NOTE — Assessment & Plan Note (Signed)
In both father and sister.  Referral placed to GI for repeat colonoscopy as she is overdue.

## 2020-12-30 ENCOUNTER — Telehealth: Payer: Self-pay | Admitting: *Deleted

## 2020-12-30 DIAGNOSIS — R600 Localized edema: Secondary | ICD-10-CM

## 2020-12-30 LAB — BASIC METABOLIC PANEL
BUN: 17 mg/dL (ref 6–23)
CO2: 25 mEq/L (ref 19–32)
Calcium: 9.1 mg/dL (ref 8.4–10.5)
Chloride: 108 mEq/L (ref 96–112)
Creatinine, Ser: 0.74 mg/dL (ref 0.40–1.20)
GFR: 79.94 mL/min (ref >60.00)
Glucose, Bld: 90 mg/dL (ref 70–99)
Potassium: 3.9 mEq/L (ref 3.5–5.1)
Sodium: 141 mEq/L (ref 135–145)

## 2020-12-30 LAB — SEDIMENTATION RATE: Sed Rate: 41 mm/hr — ABNORMAL HIGH (ref 0–30)

## 2020-12-30 NOTE — Telephone Encounter (Signed)
I was notified earlier today verbally by [name] regarding issues and told that the message was forwarded to the provider for next steps.  I saw the message late this pm and as it does not appear patient has been called to reschedule for lab by anyone at this point, I will call patient first thing in the am to address both the scheduling and the waiving of the charge for the BNP.

## 2020-12-30 NOTE — Telephone Encounter (Signed)
Please call patient and notify her that we need another sample.   We will need to credit the patient for the BNP collected yesterday, [name], can you take care of that?  I would prefer to have the BNP, see if she is agreeable to having this redrawn.  I will place another order. Please apologize for this inconvenience.

## 2020-12-30 NOTE — Telephone Encounter (Signed)
Not sure how to "credit" the pt for the BNP, will await input from management

## 2020-12-30 NOTE — Telephone Encounter (Signed)
Brenda Flores with [facility] called. Yesterday someone from [facility] lab (who was covering our lab) called her and asked about how to store a BNP over night. Brenda Flores advised them that it has to be spun and frozen. They did not spin or freeze the BNP tube so they can't run it. Pt will need to be redrawn. Message sent to PCP and [facility]

## 2020-12-31 ENCOUNTER — Other Ambulatory Visit: Payer: Medicare Other

## 2020-12-31 ENCOUNTER — Other Ambulatory Visit (INDEPENDENT_AMBULATORY_CARE_PROVIDER_SITE_OTHER): Payer: Medicare Other

## 2020-12-31 ENCOUNTER — Other Ambulatory Visit: Payer: Self-pay

## 2020-12-31 DIAGNOSIS — R600 Localized edema: Secondary | ICD-10-CM | POA: Diagnosis not present

## 2020-12-31 NOTE — Telephone Encounter (Signed)
I called patient at 815am to apologize and get her rescheduled asap for the BNP.    I could not reach her but did leave a message to call back to the office ASAP and ask for JoEllen as we need to get her set up for some more blood work.    I did inform JoEllen of what to tell patient and also to make aware that there will be no charge for the incorrectly processed BNP.   Thanks.

## 2020-12-31 NOTE — Telephone Encounter (Signed)
Called patient back she has been put on schedule for today. She gets off at 350 and will go straight to [place] but it may be 410 before she gets there is that ok?

## 2020-12-31 NOTE — Telephone Encounter (Signed)
I am just now seeing this message.  JoEllen can you please be sure patient was drawn today at [facility]?   Thank you.

## 2020-12-31 NOTE — Addendum Note (Signed)
Addended by: Leeanne Rio on: 12/31/2020 03:42 PM   Modules accepted: Orders

## 2020-12-31 NOTE — Telephone Encounter (Signed)
Called l/m to call office back

## 2021-01-01 ENCOUNTER — Telehealth: Payer: Self-pay | Admitting: Primary Care

## 2021-01-01 ENCOUNTER — Other Ambulatory Visit: Payer: Self-pay | Admitting: Primary Care

## 2021-01-01 DIAGNOSIS — M25551 Pain in right hip: Secondary | ICD-10-CM

## 2021-01-01 DIAGNOSIS — M545 Low back pain, unspecified: Secondary | ICD-10-CM

## 2021-01-01 DIAGNOSIS — G8929 Other chronic pain: Secondary | ICD-10-CM

## 2021-01-01 LAB — BRAIN NATRIURETIC PEPTIDE: Brain Natriuretic Peptide: 28 pg/mL (ref <100)

## 2021-01-01 NOTE — Telephone Encounter (Signed)
Pt returning call

## 2021-01-01 NOTE — Telephone Encounter (Signed)
Labs were collected

## 2021-01-01 NOTE — Telephone Encounter (Signed)
Spoke to patient documented on lab and xray results. No action needed on this call

## 2021-01-07 ENCOUNTER — Ambulatory Visit: Payer: Medicare Other | Admitting: Physical Therapy

## 2021-01-18 ENCOUNTER — Ambulatory Visit: Payer: Medicare Other | Admitting: Physical Therapy

## 2021-01-20 ENCOUNTER — Telehealth: Payer: Self-pay | Admitting: Primary Care

## 2021-01-20 NOTE — Telephone Encounter (Signed)
Pt called stating that sitagliptin is 400 dollars and  she can not afford it. Pt wants to know if the provider can call in a substitute. Pt  has been out for four days and she does not want metformin

## 2021-01-21 NOTE — Telephone Encounter (Signed)
Did we obtain any information about her blood sugars?  Needs to work on a healthy diet which includes fresh fruits and vegetables, lean protein, whole grains.  Limit fast/fried/fatty foods, sweets, sugary drinks.  Ensure you are consuming 64 ounces of water daily.

## 2021-01-21 NOTE — Telephone Encounter (Signed)
How are her blood sugars running?  Given her good diabetes control we could probably just stop her Januvia and recheck levels in early January 2023. Is she ok with this?  It looks like she will be due for a diabetes follow up in early January 2023. Can we schedule?

## 2021-01-21 NOTE — Telephone Encounter (Signed)
Patient called about how good she is doing with her diabetes control. Patient was informed that she could stop her Januvia till early January when she is due for a follow up for diabetes. Patient would like a diet plan to help control while off the medication. Please advise.

## 2021-01-22 NOTE — Telephone Encounter (Signed)
Number busy

## 2021-02-02 NOTE — Telephone Encounter (Signed)
Left message to return call to our office.  

## 2021-02-04 NOTE — Telephone Encounter (Signed)
Pt returned call

## 2021-02-09 NOTE — Telephone Encounter (Signed)
Left message for patient to call back  

## 2021-02-16 NOTE — Telephone Encounter (Signed)
Have tried to reach patient multiple times ok to mail letter to call office.

## 2021-02-16 NOTE — Telephone Encounter (Signed)
Okay to mail letter

## 2021-02-23 NOTE — Telephone Encounter (Signed)
Letter mailed. No further action.

## 2021-03-08 NOTE — Telephone Encounter (Signed)
Pt called she started the Januvia back, she found a copay card that got the cost down  She said that she hasn't checked her blood sugar but would check it and call back later

## 2021-03-09 NOTE — Telephone Encounter (Signed)
I recommend repeat A1C prior to resuming her Januvia. She is due for a diabetes follow up in the office, can we get her scheduled?

## 2021-03-10 NOTE — Telephone Encounter (Signed)
Left message to return call to our office.  

## 2021-03-11 NOTE — Telephone Encounter (Signed)
Noted, please send letter.

## 2021-03-11 NOTE — Telephone Encounter (Signed)
Called patient x 3 no call back she has app made 04/07/2021

## 2021-03-11 NOTE — Telephone Encounter (Signed)
Left message to return call to our office.  

## 2021-03-16 NOTE — Telephone Encounter (Signed)
Letter mailed to address on file.

## 2021-04-07 ENCOUNTER — Ambulatory Visit: Payer: Medicare Other | Admitting: Primary Care

## 2021-04-11 ENCOUNTER — Other Ambulatory Visit: Payer: Self-pay | Admitting: Primary Care

## 2021-04-11 DIAGNOSIS — I10 Essential (primary) hypertension: Secondary | ICD-10-CM

## 2021-04-11 NOTE — Telephone Encounter (Signed)
Patient no showed for appointment in January 2023 for general follow up and labs. Needs to be rescheduled. Let me know when she's scheduled.

## 2021-04-12 NOTE — Telephone Encounter (Signed)
Left message to return call to our office.  

## 2021-04-15 NOTE — Telephone Encounter (Signed)
Noted. Refill(s) sent to pharmacy.  

## 2021-04-15 NOTE — Telephone Encounter (Signed)
Looks like patient has appointment on 2/14

## 2021-04-22 ENCOUNTER — Encounter: Payer: Self-pay | Admitting: Primary Care

## 2021-05-04 ENCOUNTER — Ambulatory Visit: Payer: Medicare HMO | Admitting: Primary Care

## 2021-06-18 ENCOUNTER — Encounter: Payer: Medicare HMO | Admitting: Primary Care

## 2021-07-06 ENCOUNTER — Encounter: Payer: Self-pay | Admitting: Primary Care

## 2021-07-06 ENCOUNTER — Ambulatory Visit (INDEPENDENT_AMBULATORY_CARE_PROVIDER_SITE_OTHER)
Admission: RE | Admit: 2021-07-06 | Discharge: 2021-07-06 | Disposition: A | Discharge status: Home / Self Care | Payer: Medicare HMO | Source: Ambulatory Visit | Attending: Primary Care | Admitting: Primary Care

## 2021-07-06 ENCOUNTER — Ambulatory Visit (INDEPENDENT_AMBULATORY_CARE_PROVIDER_SITE_OTHER): Payer: Medicare HMO | Admitting: Primary Care

## 2021-07-06 VITALS — BP 120/64 | HR 74 | Temp 98.6°F | Ht 65.5 in | Wt 190.0 lb

## 2021-07-06 DIAGNOSIS — G8929 Other chronic pain: Secondary | ICD-10-CM

## 2021-07-06 DIAGNOSIS — M25551 Pain in right hip: Secondary | ICD-10-CM

## 2021-07-06 DIAGNOSIS — Z1211 Encounter for screening for malignant neoplasm of colon: Secondary | ICD-10-CM | POA: Diagnosis not present

## 2021-07-06 DIAGNOSIS — Z1231 Encounter for screening mammogram for malignant neoplasm of breast: Secondary | ICD-10-CM | POA: Diagnosis not present

## 2021-07-06 DIAGNOSIS — I10 Essential (primary) hypertension: Secondary | ICD-10-CM

## 2021-07-06 DIAGNOSIS — E785 Hyperlipidemia, unspecified: Secondary | ICD-10-CM

## 2021-07-06 DIAGNOSIS — M25552 Pain in left hip: Secondary | ICD-10-CM

## 2021-07-06 DIAGNOSIS — K7689 Other specified diseases of liver: Secondary | ICD-10-CM

## 2021-07-06 DIAGNOSIS — R600 Localized edema: Secondary | ICD-10-CM

## 2021-07-06 DIAGNOSIS — M79672 Pain in left foot: Secondary | ICD-10-CM

## 2021-07-06 DIAGNOSIS — M79671 Pain in right foot: Secondary | ICD-10-CM | POA: Diagnosis not present

## 2021-07-06 DIAGNOSIS — Z800 Family history of malignant neoplasm of digestive organs: Secondary | ICD-10-CM | POA: Diagnosis not present

## 2021-07-06 DIAGNOSIS — Z0001 Encounter for general adult medical examination with abnormal findings: Secondary | ICD-10-CM

## 2021-07-06 DIAGNOSIS — E1165 Type 2 diabetes mellitus with hyperglycemia: Secondary | ICD-10-CM

## 2021-07-06 DIAGNOSIS — E2839 Other primary ovarian failure: Secondary | ICD-10-CM | POA: Diagnosis not present

## 2021-07-06 DIAGNOSIS — M7989 Other specified soft tissue disorders: Secondary | ICD-10-CM | POA: Diagnosis not present

## 2021-07-06 DIAGNOSIS — Z0000 Encounter for general adult medical examination without abnormal findings: Secondary | ICD-10-CM | POA: Insufficient documentation

## 2021-07-06 LAB — COMPREHENSIVE METABOLIC PANEL
ALT: 12 U/L (ref 0–35)
AST: 16 U/L (ref 0–37)
Albumin: 4.4 g/dL (ref 3.5–5.2)
Alkaline Phosphatase: 56 U/L (ref 39–117)
BUN: 12 mg/dL (ref 6–23)
CO2: 30 mEq/L (ref 19–32)
Calcium: 9.6 mg/dL (ref 8.4–10.5)
Chloride: 106 mEq/L (ref 96–112)
Creatinine, Ser: 0.95 mg/dL (ref 0.40–1.20)
GFR: 59.02 mL/min — ABNORMAL LOW (ref >60.00)
Glucose, Bld: 95 mg/dL (ref 70–99)
Potassium: 4.5 mEq/L (ref 3.5–5.1)
Sodium: 141 mEq/L (ref 135–145)
Total Bilirubin: 0.4 mg/dL (ref 0.2–1.2)
Total Protein: 7.6 g/dL (ref 6.0–8.3)

## 2021-07-06 LAB — LIPID PANEL
Cholesterol: 276 mg/dL — ABNORMAL HIGH (ref 0–200)
HDL: 42.7 mg/dL (ref >39.00)
LDL Cholesterol: 200 mg/dL — ABNORMAL HIGH (ref 0–99)
NonHDL: 232.9
Total CHOL/HDL Ratio: 6
Triglycerides: 167 mg/dL — ABNORMAL HIGH (ref 0.0–149.0)
VLDL: 33.4 mg/dL (ref 0.0–40.0)

## 2021-07-06 LAB — CBC
HCT: 37.6 % (ref 36.0–46.0)
Hemoglobin: 12.4 g/dL (ref 12.0–15.0)
MCHC: 33 g/dL (ref 30.0–36.0)
MCV: 88.9 fl (ref 78.0–100.0)
Platelets: 218 10*3/uL (ref 150.0–400.0)
RBC: 4.23 Mil/uL (ref 3.87–5.11)
RDW: 15.7 % — ABNORMAL HIGH (ref 11.5–15.5)
WBC: 5.4 10*3/uL (ref 4.0–10.5)

## 2021-07-06 LAB — HEMOGLOBIN A1C: Hgb A1c MFr Bld: 6.5 % (ref 4.6–6.5)

## 2021-07-06 MED ORDER — AMLODIPINE BESYLATE 5 MG PO TABS: 5.0000 mg | ORAL_TABLET | Freq: Every day | ORAL | 0 refills | Status: DC

## 2021-07-06 NOTE — Assessment & Plan Note (Signed)
Checking plain films today. ? ?Consider podiatry referral. ?

## 2021-07-06 NOTE — Progress Notes (Signed)
? ?Subjective:  ? ? Patient ID: Brenda Flores, female    DOB: August 20, 1945, 76 y.o.   MRN: [id number] ? ?HPI ? ?Brenda Flores is a very pleasant 76 y.o. female who presents today for complete physical and follow up of chronic conditions. ? ?She would also like to mention left dorsal foot pain. Acute for the last 2 months, pain occurs with extension and flexion with swelling. She's noticed a knot to the site of her pain. Years ago she underwent bilateral heel spur surgery and some form of operation to her dorsal foot. She's also noticed pedal edema that is most bothersome throughout the day. Swelling reduces at night. She is compliant to her amlodipine 10 mg daily.  ? ?She would also like to discuss chronic right hip pain.  Occurs mostly with ambulation, especially if ambulating for prolonged periods of time.  She will walk around the mall and have to stop and rest due to her pain.  She denies trauma/injury.  She is considering water aerobics so that she can continue exercising.  She has not undergone x-ray. ? ?Immunizations: ?-Tetanus: 2019 ?-Influenza: Completed last season ?-Covid-19: 2 vaccines ?-Shingles: Completed at the pharmacy ?-Pneumonia: Prevnar 13 in 2018, Pneumovax 23 in 2016 ? ?Diet: Fair diet.  ?Exercise: No regular exercise. ? ?Eye exam: Completed several years ago.  ?Dental exam: Completed years ago.  ? ?Mammogram: Completed in 2019 ?Colonoscopy: Completed years ago, overdue.  Referral placed in October 2022.  Patient never completed. ?Dexa: Completed in 2019 ? ?BP Readings from Last 3 Encounters:  ?07/06/21 120/64  ?12/29/20 118/68  ?09/04/20 110/62  ? ? ? ? ? ?Review of Systems  ?Constitutional:  Negative for unexpected weight change.  ?HENT:  Negative for rhinorrhea.   ?Eyes:  Negative for visual disturbance.  ?Respiratory:  Negative for cough and shortness of breath.   ?Cardiovascular:  Negative for chest pain.  ?     Pedal edema  ?Gastrointestinal:  Negative for constipation  and diarrhea.  ?Genitourinary:  Negative for difficulty urinating.  ?Musculoskeletal:  Positive for arthralgias. Negative for joint swelling.  ?Skin:  Negative for rash.  ?Allergic/Immunologic: Negative for environmental allergies.  ?Neurological:  Negative for dizziness and headaches.  ?Psychiatric/Behavioral:  The patient is not nervous/anxious.   ? ?   ? ? ?Past Medical History:  ?Diagnosis Date  ? Achilles rupture, right   ? Bronchitis   ? CAD (coronary artery disease) Dr Burt Knack  ? non-obs CAD by cath in 7/07 (mLAD 30-40%, EF 65%)  ? DDD (degenerative disc disease), lumbar   ? DJD (degenerative joint disease)   ? KNEES  ? GERD (gastroesophageal reflux disease)   ? History of mitral valve prolapse   ? PER PT REPORTED [facility]  PER DR HARSHAW FROM CARDIAC CATH IN 1987 (APPROX)  ? History of rectal polyps   ? 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  ? Hyperlipidemia   ? Hypertension   ? Left nephrolithiasis   ? NON-OBSTRUCTIVE  ? Liver, polycystic   ? Lumbar radiculopathy   ? Osteoporosis   ? PONV (postoperative nausea and vomiting)   ? Right ureteral stone   ? Type 2 diabetes mellitus ([place])   ? ? ?Social History  ? ?Socioeconomic History  ? Marital status: Widowed  ?  Spouse name: Not on file  ? Number of children: 2  ? Years of education: Not on file  ? Highest education level: Not on file  ?Occupational History  ? 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 Smoking status: Never  ? Smokeless tobacco: Never  ?Vaping Use  ? Vaping Use: Never used  ?Substance and Sexual Activity  ? Alcohol use: No  ?  Alcohol/week: 0.0 standard drinks  ? Drug use: No  ? Sexual activity: Not on file  ?Other Topics Concern  ? Not on file  ?Social History Narrative  ? Not on file  ? ?Social Determinants of Health  ? ?Financial Resource Strain: Not on file  ?Food Insecurity: Not on file  ?Transportation Needs: Not on file  ?Physical Activity: Not on file  ?Stress: Not on file  ?Social Connections: Not on file  ?Intimate Partner  Violence: Not on file  ? ? ?Past Surgical History:  ?Procedure Laterality Date  ? ACHILLES TENDON SURGERY Right 05/26/2016  ? Procedure: RIGHT ACHILLES TENDON REPAIR,CALCANEUS SPUR EXCISION;  Surgeon: Ninetta Lights, MD;  Location: [facility];  Service: Orthopedics;  Laterality: Right;  ? CARDIAC CATHETERIZATION  10-17-2005   DR Burt Knack  ? NON-OBSTRUCTIVE CAD/   mLAD 30-40%/   NORMAL LVF/  EF 65%  ? CARDIOVASCULAR STRESS TEST  04-12-2011  DR Cristopher Peru  ? LOW RISK STUDY/  NO ISCHEMIA/  EF 68%  ? CATARACT EXTRACTION W/ INTRAOCULAR LENS IMPLANT Right 2003  ? COLONOSCOPY W/ POLYPECTOMY  03-17-2011  ? CYSTOSCOPY WITH URETEROSCOPY Right 06/14/2013  ? Procedure: RIGHT  URETEROSCOPY,ureteral and urethral dilitation;  Surgeon: Claybon Jabs, MD;  Location: [facility];  Service: Urology;  Laterality: Right;  ? HEEL SPUR RESECTION Right 05/26/2016  ? Procedure: HEEL SPUR EXCISION;  Surgeon: Ninetta Lights, MD;  Location: [facility];  Service: Orthopedics;  Laterality: Right;  ? HEEL SPUR SURGERY Left 2003  ? TOTAL ABDOMINAL HYSTERECTOMY W/ BILATERAL SALPINGOOPHORECTOMY  1978  ? TRANSTHORACIC ECHOCARDIOGRAM  04-07-2011  ? MILD LVH/  MILD FOCAL BASAL SEPTAL HYPERTROPHY/  EF  55-60% /  GRADE I DIASTOLIC DYSFUNCTION  ? ? ?Family History  ?Problem Relation Age of Onset  ? Cancer Mother   ?     breast  ? Heart disease Mother   ? Cancer Father   ?     colon  ? Cancer Brother   ?     ?lung  ? Heart disease Brother   ? Heart disease Maternal Aunt   ? Breast cancer Neg Hx   ? ? ?Allergies  ?Allergen Reactions  ? Darvocet [Propoxyphene N-Acetaminophen] Nausea And Vomiting  ? Propoxyphene Other (See Comments)  ? Tetanus Toxoids   ?  TDAP hives, pt reports she had TD in the past w/ no issues  ? ? ?Current Outpatient Medications on File Prior to Visit  ?Medication Sig Dispense Refill  ? albuterol (PROVENTIL) (2.5 MG/3ML) 0.083% nebulizer solution Take 3 mLs (2.5 mg total) by nebulization every 6  (six) hours as needed for wheezing or shortness of breath. 150 mL 1  ? amLODipine (NORVASC) 10 MG tablet Take 1 tablet (10 mg total) by mouth daily. For blood pressure. Office visit required for further refills. 90 tablet 0  ? carvedilol (COREG) 6.25 MG tablet Take 1 tablet (6.25 mg total) by mouth 2 (two) times daily. For blood pressure. Office visit required for further refills. 180 tablet 0  ? ibuprofen (ADVIL) 800 MG tablet Take 1 tablet (800 mg total) by mouth 3 (three) times daily. For 7 days 21 tablet 0  ? rosuvastatin (CRESTOR) 10 MG tablet Take 1 tablet (10 mg total) by mouth  daily. (Patient not taking: Reported on 09/04/2020) 90 tablet 3  ? ?No current facility-administered medications on file prior to visit.  ? ? ?There were no vitals taken for this visit. ?Objective:  ? Physical Exam ?HENT:  ?   Right Ear: Tympanic membrane and ear canal normal.  ?   Left Ear: Tympanic membrane and ear canal normal.  ?   Nose: Nose normal.  ?Eyes:  ?   Conjunctiva/sclera: Conjunctivae normal.  ?   Pupils: Pupils are equal, round, and reactive to light.  ?Neck:  ?   Thyroid: No thyromegaly.  ?Cardiovascular:  ?   Rate and Rhythm: Normal rate and regular rhythm.  ?   Heart sounds: No murmur heard. ?Pulmonary:  ?   Effort: Pulmonary effort is normal.  ?   Breath sounds: Normal breath sounds. No rales.  ?Abdominal:  ?   General: Bowel sounds are normal.  ?   Palpations: Abdomen is soft.  ?   Tenderness: There is no abdominal tenderness.  ?Musculoskeletal:  ?   Cervical back: Neck supple.  ?   Left foot: Decreased range of motion. Normal pulse.  ?   Comments: 2 cm rounded, subcutaneous, firm mass to left mid dorsal foot.   ?Lymphadenopathy:  ?   Cervical: No cervical adenopathy.  ?Skin: ?   General: Skin is warm and dry.  ?   Findings: No rash.  ?Neurological:  ?   Mental Status: She is alert and oriented to person, place, and time.  ?   Cranial Nerves: No cranial nerve deficit.  ?   Deep Tendon Reflexes: Reflexes are normal  and symmetric.  ?Psychiatric:     ?   Mood and Affect: Mood normal.  ? ? ? ? ? ?   ?Assessment & Plan:  ? ? ? ? ?This visit occurred during the SARS-CoV-2 public health emergency.  Safety protocols were

## 2021-07-06 NOTE — Assessment & Plan Note (Addendum)
Controlled. ? ?Suspect amlodipine is contributing to pedal edema.  Reduce amlodipine to 5 mg daily to see if this is enough for blood pressure treatment and to reduce pedal edema. ? ?She has a blood pressure cuff at home and will monitor.  She will call if readings increase to 130/90 or higher. ? ?Continue carvedilol 6.25 mg twice daily. Start amlodipine 5 mg daily. ? ?Labs pending. ?

## 2021-07-06 NOTE — Assessment & Plan Note (Signed)
Immunizations up-to-date. ? ?Mammogram and bone density scan overdue, orders placed. ? ?Colonoscopy overdue, patient does agree to proceed.  Referral placed to GI.  I also asked her to check her phone number upfront to ensure this is correct. ? ?Discussed the importance of a healthy diet and regular exercise in order for weight loss, and to reduce the risk of further co-morbidity. ? ?Exam today stable. ?Labs pending. ?

## 2021-07-06 NOTE — Patient Instructions (Signed)
Complete xray(s) and labs prior to leaving today. I will notify you of your results once received. ? ?We reduce the dose of your amlodipine to 5 mg daily for your blood pressure.  We reduce the dose because of your foot swelling. ? ?Please watch your blood pressure at home, notify me if you start to see blood pressure readings consistently at or above 130 on top and/or 90 on bottom. ? ?Please update [place] in a few weeks regarding your foot swelling. ? ?You will be contacted regarding your referral to GI for the colonoscopy.  Please let us know if you have not been contacted within two weeks.  ? ?It was a pleasure to see you today! ? ?Preventive Care 43 Years and Older, Female ?Preventive care refers to lifestyle choices and visits with your health care provider that can promote health and wellness. Preventive care visits are also called wellness exams. ?What can I expect for my preventive care visit? ?Counseling ?Your health care provider may ask you questions about your: ?Medical history, including: ?Past medical problems. ?Family medical history. ?Pregnancy and menstrual history. ?History of falls. ?Current health, including: ?Memory and ability to understand (cognition). ?Emotional well-being. ?Home life and relationship well-being. ?Sexual activity and sexual health. ?Lifestyle, including: ?Alcohol, nicotine or tobacco, and drug use. ?Access to firearms. ?Diet, exercise, and sleep habits. ?Work and work Statistician. ?Sunscreen use. ?Safety issues such as seatbelt and bike helmet use. ?Physical exam ?Your health care provider will check your: ?Height and weight. These may be used to calculate your BMI (body mass index). BMI is a measurement that tells if you are at a healthy weight. ?Waist circumference. This measures the distance around your waistline. This measurement also tells if you are at a healthy weight and may help predict your risk of certain diseases, such as type 2 diabetes and high blood  pressure. ?Heart rate and blood pressure. ?Body temperature. ?Skin for abnormal spots. ?What immunizations do I need? ? ?Vaccines are usually given at various ages, according to a schedule. Your health care provider will recommend vaccines for you based on your age, medical history, and lifestyle or other factors, such as travel or where you work. ?What tests do I need? ?Screening ?Your health care provider may recommend screening tests for certain conditions. This may include: ?Lipid and cholesterol levels. ?Hepatitis C test. ?Hepatitis B test. ?HIV (human immunodeficiency virus) test. ?STI (sexually transmitted infection) testing, if you are at risk. ?Lung cancer screening. ?Colorectal cancer screening. ?Diabetes screening. This is done by checking your blood sugar (glucose) after you have not eaten for a while (fasting). ?Mammogram. Talk with your health care provider about how often you should have regular mammograms. ?BRCA-related cancer screening. This may be done if you have a family history of breast, ovarian, tubal, or peritoneal cancers. ?Bone density scan. This is done to screen for osteoporosis. ?Talk with your health care provider about your test results, treatment options, and if necessary, the need for more tests. ?Follow these instructions at home: ?Eating and drinking ? ?Eat a diet that includes fresh fruits and vegetables, whole grains, lean protein, and low-fat dairy products. Limit your intake of foods with high amounts of sugar, saturated fats, and salt. ?Take vitamin and mineral supplements as recommended by your health care provider. ?Do not drink alcohol if your health care provider tells you not to drink. ?If you drink alcohol: ?Limit how much you have to 0-1 drink a day. ?Know how much alcohol is in your drink. In  the [place] one drink equals one 12 oz bottle of beer (355 mL), one 5 oz glass of wine (148 mL), or one 1? oz glass of hard liquor (44 mL). ?Lifestyle ?Brush your teeth every  morning and night with fluoride toothpaste. Floss one time each day. ?Exercise for at least 30 minutes 5 or more days each week. ?Do not use any products that contain nicotine or tobacco. These products include cigarettes, chewing tobacco, and vaping devices, such as e-cigarettes. If you need help quitting, ask your health care provider. ?Do not use drugs. ?If you are sexually active, practice safe sex. Use a condom or other form of protection in order to prevent STIs. ?Take aspirin only as told by your health care provider. Make sure that you understand how much to take and what form to take. Work with your health care provider to find out whether it is safe and beneficial for you to take aspirin daily. ?Ask your health care provider if you need to take a cholesterol-lowering medicine (statin). ?Find healthy ways to manage stress, such as: ?Meditation, yoga, or listening to music. ?Journaling. ?Talking to a trusted person. ?Spending time with friends and family. ?Minimize exposure to UV radiation to reduce your risk of skin cancer. ?Safety ?Always wear your seat belt while driving or riding in a vehicle. ?Do not drive: ?If you have been drinking alcohol. Do not ride with someone who has been drinking. ?When you are tired or distracted. ?While texting. ?If you have been using any mind-altering substances or drugs. ?Wear a helmet and other protective equipment during sports activities. ?If you have firearms in your house, make sure you follow all gun safety procedures. ?What's next? ?Visit your health care provider once a year for an annual wellness visit. ?Ask your health care provider how often you should have your eyes and teeth checked. ?Stay up to date on all vaccines. ?This information is not intended to replace advice given to you by your health care provider. Make sure you discuss any questions you have with your health care provider. ?Document Revised: 09/02/2020 Document Reviewed: 09/02/2020 ?Elsevier  Patient Education ? [place]. ? ?

## 2021-07-06 NOTE — Assessment & Plan Note (Signed)
Repeat A1C pending. ? ?Continue Januvia 50 mg daily. ?Not on statin therapy per patient choice. ? ?Urine microalbumin due and pending. ?Discussed to schedule eye exam. ? ?Follow up in 3-6 months depending on A1C result.  ?

## 2021-07-06 NOTE — Assessment & Plan Note (Signed)
Chronic and ongoing. ? ?Do suspect amlodipine to be contributing.  Reduce amlodipine to 5 mg daily.  She will monitor blood pressure at home.  She will also update regarding her swelling. ?

## 2021-07-06 NOTE — Assessment & Plan Note (Signed)
Chronic and continued.  Never completed PT as requested. ? ?Agree with water aerobics. ? ?Reviewed x-rays from October 2022 which were negative. ?She will update if she changes her mind regarding physical therapy or orthopedic evaluation. ?

## 2021-07-06 NOTE — Assessment & Plan Note (Signed)
Patient contacted several times by GI for the colonoscopy, patient never responded. ? ?She agrees to proceed with colonoscopy today, referral placed to GI. ?

## 2021-07-06 NOTE — Assessment & Plan Note (Signed)
No longer on rosuvastatin per patient request. ? ?Repeat lipid panel pending. ?

## 2021-07-06 NOTE — Addendum Note (Signed)
Addended by: Ellamae Sia on: 07/06/2021 04:44 PM ? ? Modules accepted: Orders ? ?

## 2021-07-06 NOTE — Assessment & Plan Note (Signed)
Chronic. No recent imaging on file. ?Will repeat liver US. Orders placed.  ?

## 2021-07-08 ENCOUNTER — Telehealth: Payer: Self-pay

## 2021-07-08 ENCOUNTER — Other Ambulatory Visit: Payer: Medicare HMO

## 2021-07-08 DIAGNOSIS — E1165 Type 2 diabetes mellitus with hyperglycemia: Secondary | ICD-10-CM | POA: Diagnosis not present

## 2021-07-08 DIAGNOSIS — M79672 Pain in left foot: Secondary | ICD-10-CM

## 2021-07-08 NOTE — Telephone Encounter (Signed)
Yes, she can call the [facility] office to see if she can get an appointment.  If she is an established patient she should be able to call to request an appointment.  Let me know if they require a referral. ?

## 2021-07-08 NOTE — Telephone Encounter (Signed)
Pt called back and would like to see podiatry in [place]. She does she Dr Noemi Chapel at [facility] ortho if that is an option. ?

## 2021-07-08 NOTE — Telephone Encounter (Signed)
Called and lvm for pt call us back regarding results. ?

## 2021-07-08 NOTE — Telephone Encounter (Signed)
-----   Message from Pleas Koch, NP sent at 07/08/2021  7:27 AM EDT ----- ?Please call patient: ? ?The x-ray of her foot shows swelling, but no bony abnormality. ?She does have bone spurs to the bottom of her foot. ? ?Would she like to see a foot doctor? ?If so, [place] or [place]? ?

## 2021-07-09 ENCOUNTER — Telehealth: Payer: Self-pay

## 2021-07-09 ENCOUNTER — Other Ambulatory Visit: Payer: Self-pay | Admitting: Primary Care

## 2021-07-09 LAB — MICROALBUMIN / CREATININE URINE RATIO
Creatinine,U: 274.3 mg/dL
Microalb Creat Ratio: 1.4 mg/g (ref 0.0–30.0)
Microalb, Ur: 3.7 mg/dL — ABNORMAL HIGH (ref 0.0–1.9)

## 2021-07-09 NOTE — Telephone Encounter (Signed)
Called and informed pt of this information , she said that she will give them a call  to make an appt, I advised if she has issues with getting an a appt with their office to give [place] a call back.  ?

## 2021-07-09 NOTE — Telephone Encounter (Signed)
Patient advised of x-ray results. She states that she is experiencing a lot of pain especially when walking. Her foot also throbs at night. For pain management she is alternating Tylenol and Ibuprofen, but wants to know what else she can do for better pain management. She would like a referral to [facility] in [place], [place].  ?

## 2021-07-09 NOTE — Telephone Encounter (Signed)
Left message on voicemail to return my call. I need to clarify which doctor she wants to see in regards to her foot.  ?

## 2021-07-09 NOTE — Telephone Encounter (Signed)
Opened in error

## 2021-07-09 NOTE — Telephone Encounter (Signed)
Did she mean Dr. Noemi Chapel, the orthopedist in [place]? ?

## 2021-07-09 NOTE — Telephone Encounter (Signed)
-----   Message from Pleas Koch, NP sent at 07/08/2021  7:27 AM EDT ----- ?Please call patient: ? ?The x-ray of her foot shows swelling, but no bony abnormality. ?She does have bone spurs to the bottom of her foot. ? ?Would she like to see a foot doctor? ?If so, [place] or [place]? ?

## 2021-07-09 NOTE — Telephone Encounter (Signed)
-----   Message from Pleas Koch, NP sent at 07/07/2021  6:54 AM EDT ----- ?Please call patient: ? ?Kidney function is slightly down, make sure she is drinking plenty of water during the day, avoiding recurrent use of NSAID medications. ? ?Liver, electrolytes, blood counts, diabetes look good. ?Please confirm that she is taking Januvia 50 mg daily. ? ?Cholesterol is very high and needs to be treated with medication.  She is at high risk for heart attack/stroke if we do not lower her levels.  Is there a reason she is no longer taking her cholesterol medication?  If she willing to resume? ? ?She needs repeat cholesterol/A1c level in 3 months, lab only appointment. ?She needs an in office visit in 6 months for diabetes follow-up. ?

## 2021-07-09 NOTE — Telephone Encounter (Signed)
Left message on voicemail to return my call.  

## 2021-07-12 ENCOUNTER — Telehealth: Payer: Self-pay | Admitting: Primary Care

## 2021-07-12 NOTE — Telephone Encounter (Signed)
Spoke to patient by telephone and was advised that she would like to see Dr. Noemi Chapel the orthopedist in [place]. Patient stated that she use to see his dad. Patient was advised that a referral coordinator will be back in touch with her to get this set up. ?

## 2021-07-12 NOTE — Telephone Encounter (Signed)
See other phone note regarding a referral for her foot. ?

## 2021-07-12 NOTE — Telephone Encounter (Signed)
Pt aware of lab results but would like to know if there's anything else to discuss. Please advise.  ?

## 2021-07-12 NOTE — Telephone Encounter (Signed)
Referral placed.

## 2021-07-12 NOTE — Addendum Note (Signed)
Addended by: Waunita Schooner R on: 07/12/2021 12:25 PM ? ? Modules accepted: Orders ? ?

## 2021-07-14 ENCOUNTER — Ambulatory Visit
Admission: RE | Admit: 2021-07-14 | Discharge: 2021-07-14 | Disposition: A | Discharge status: Home / Self Care | Payer: Medicare HMO | Source: Ambulatory Visit | Attending: Primary Care | Admitting: Primary Care

## 2021-07-14 ENCOUNTER — Telehealth: Payer: Self-pay

## 2021-07-14 DIAGNOSIS — Z1231 Encounter for screening mammogram for malignant neoplasm of breast: Secondary | ICD-10-CM

## 2021-07-14 NOTE — Telephone Encounter (Signed)
-----   Message from Pleas Koch, NP sent at 07/07/2021  6:54 AM EDT ----- ?Please call patient: ? ?Kidney function is slightly down, make sure she is drinking plenty of water during the day, avoiding recurrent use of NSAID medications. ? ?Liver, electrolytes, blood counts, diabetes look good. ?Please confirm that she is taking Januvia 50 mg daily. ? ?Cholesterol is very high and needs to be treated with medication.  She is at high risk for heart attack/stroke if we do not lower her levels.  Is there a reason she is no longer taking her cholesterol medication?  If she willing to resume? ? ?She needs repeat cholesterol/A1c level in 3 months, lab only appointment. ?She needs an in office visit in 6 months for diabetes follow-up. ?

## 2021-07-14 NOTE — Telephone Encounter (Signed)
Called and lvm for patient to call us back regarding lab results. ?

## 2021-07-16 ENCOUNTER — Other Ambulatory Visit: Payer: Self-pay | Admitting: Primary Care

## 2021-07-16 ENCOUNTER — Other Ambulatory Visit: Payer: Self-pay | Admitting: Nurse Practitioner

## 2021-07-16 ENCOUNTER — Telehealth: Payer: Self-pay

## 2021-07-16 ENCOUNTER — Encounter: Payer: Self-pay | Admitting: Primary Care

## 2021-07-16 DIAGNOSIS — I10 Essential (primary) hypertension: Secondary | ICD-10-CM

## 2021-07-16 MED ORDER — CARVEDILOL 6.25 MG PO TABS: 6.2500 mg | ORAL_TABLET | Freq: Two times a day (BID) | ORAL | 0 refills | Status: DC

## 2021-07-16 NOTE — Telephone Encounter (Signed)
Left message on voicemail to return my call.  

## 2021-07-16 NOTE — Telephone Encounter (Signed)
-----   Message from Pleas Koch, NP sent at 07/09/2021  5:13 PM EDT ----- ?Please notify patient that her urine test was negative for kidney damage for diabetes.  ?

## 2021-07-16 NOTE — Telephone Encounter (Signed)
-----   Message from Pleas Koch, NP sent at 07/07/2021  6:54 AM EDT ----- ?Please call patient: ? ?Kidney function is slightly down, make sure she is drinking plenty of water during the day, avoiding recurrent use of NSAID medications. ? ?Liver, electrolytes, blood counts, diabetes look good. ?Please confirm that she is taking Januvia 50 mg daily. ? ?Cholesterol is very high and needs to be treated with medication.  She is at high risk for heart attack/stroke if we do not lower her levels.  Is there a reason she is no longer taking her cholesterol medication?  If she willing to resume? ? ?She needs repeat cholesterol/A1c level in 3 months, lab only appointment. ?She needs an in office visit in 6 months for diabetes follow-up. ?

## 2021-07-22 ENCOUNTER — Inpatient Hospital Stay: Admission: RE | Admit: 2021-07-22 | Payer: Medicare HMO | Source: Ambulatory Visit

## 2021-07-26 ENCOUNTER — Ambulatory Visit (INDEPENDENT_AMBULATORY_CARE_PROVIDER_SITE_OTHER): Payer: Medicare HMO | Admitting: *Deleted

## 2021-10-21 ENCOUNTER — Other Ambulatory Visit: Payer: Self-pay | Admitting: Primary Care

## 2021-10-21 ENCOUNTER — Other Ambulatory Visit: Payer: Self-pay | Admitting: Nurse Practitioner

## 2021-10-21 DIAGNOSIS — I10 Essential (primary) hypertension: Secondary | ICD-10-CM

## 2021-11-16 ENCOUNTER — Other Ambulatory Visit: Payer: Self-pay

## 2021-11-16 NOTE — Patient Outreach (Signed)
[facility]) Care Management  11/16/2021  Brenda Flores 1946-01-16 [id number]   Telephone Screen    Outreach call to patient to introduce [facility] services and assess care needs as part of benefit of PCP office and insurance plan. No answer after multiple rings.     Plan: RN CM will make outreach attempt to patient within 4 business days.   Enzo Montgomery, RN,BSN,CCM [place] Management Telephonic Care Management Coordinator Direct Phone: [phone] Toll Free: [phone] Fax: [phone]

## 2021-11-18 ENCOUNTER — Other Ambulatory Visit: Payer: Self-pay

## 2021-11-18 NOTE — Patient Outreach (Signed)
[facility]) Care Management  11/18/2021  DENAI CABA Dec 16, 1945 [id number]   Telephone Screen       Outreach call to patient to introduce [facility] services and assess care needs as part of benefit of PCP office and insurance plan. No answer after multiple rings.     Plan: Another outreach attempt will made to patient at later time.  Enzo Montgomery, RN,BSN,CCM [place] Management Telephonic Care Management Coordinator Direct Phone: [phone] Toll Free: [phone] Fax: [phone]

## 2021-11-21 ENCOUNTER — Other Ambulatory Visit: Payer: Self-pay | Admitting: Primary Care

## 2021-11-21 DIAGNOSIS — E119 Type 2 diabetes mellitus without complications: Secondary | ICD-10-CM

## 2021-11-22 NOTE — Telephone Encounter (Signed)
See result note from April 2023 for labs. Did we ever get a hold of her? If not then need to try again and get her scheduled for an in office visit for October. Try to answer the questions from that result note if you can get her.

## 2021-11-23 ENCOUNTER — Telehealth: Payer: Self-pay

## 2021-11-23 NOTE — Patient Outreach (Signed)
  Care Coordination   11/23/2021 Name: Brenda Flores MRN: [id number] DOB: 01/12/1946   Care Coordination Outreach Attempts:  A third unsuccessful outreach was attempted today to offer the patient with information about available care coordination services as a benefit of their health plan.   Follow Up Plan:  No further outreach attempts will be made at this time. We have been unable to contact the patient to offer or enroll patient in care coordination services  Encounter Outcome:  No Answer  Care Coordination Interventions Activated:  No   Care Coordination Interventions:  No, not indicated     Enzo Montgomery, RN,BSN,CCM [place] Management Telephonic Care Management Coordinator Direct Phone: [phone] Toll Free: [phone] Fax: [phone]

## 2021-11-25 NOTE — Telephone Encounter (Signed)
Left message to return call to our office.  

## 2021-12-09 NOTE — Telephone Encounter (Signed)
Called patient she requested I call back in 10 minutes.

## 2021-12-09 NOTE — Telephone Encounter (Signed)
Called patient back. Reviewed labs from 4/18.  She has been taking NSAID a lot recently will work in limiting.  She had been taking the Januvia daily until she went to get last refill. Was told she is in the doughnut hole. Was going to go up to $450 a month. She was given 7 days but will be out today.  She stopped taking her cholesterol medications because she thought she was doing better. She is willing to restart.   I have made patient office visit on 9/26 for f/u she did not ever come back in 3 months for A1c and is close to the time you wanted to follow up in office.

## 2021-12-09 NOTE — Telephone Encounter (Signed)
Noted. Will discuss during upcoming visit. Will also see if we can get patient assistance for Januvia.

## 2021-12-14 ENCOUNTER — Encounter: Payer: Self-pay | Admitting: Primary Care

## 2021-12-14 ENCOUNTER — Ambulatory Visit (INDEPENDENT_AMBULATORY_CARE_PROVIDER_SITE_OTHER): Payer: Medicare HMO | Admitting: Primary Care

## 2021-12-14 VITALS — BP 122/80 | HR 62 | Temp 97.4°F | Ht 65.5 in | Wt 185.0 lb

## 2021-12-14 DIAGNOSIS — Z800 Family history of malignant neoplasm of digestive organs: Secondary | ICD-10-CM | POA: Diagnosis not present

## 2021-12-14 DIAGNOSIS — M25552 Pain in left hip: Secondary | ICD-10-CM | POA: Diagnosis not present

## 2021-12-14 DIAGNOSIS — Z1211 Encounter for screening for malignant neoplasm of colon: Secondary | ICD-10-CM

## 2021-12-14 DIAGNOSIS — E1165 Type 2 diabetes mellitus with hyperglycemia: Secondary | ICD-10-CM

## 2021-12-14 DIAGNOSIS — M25551 Pain in right hip: Secondary | ICD-10-CM | POA: Diagnosis not present

## 2021-12-14 DIAGNOSIS — E785 Hyperlipidemia, unspecified: Secondary | ICD-10-CM

## 2021-12-14 DIAGNOSIS — M159 Polyosteoarthritis, unspecified: Secondary | ICD-10-CM | POA: Diagnosis not present

## 2021-12-14 DIAGNOSIS — N289 Disorder of kidney and ureter, unspecified: Secondary | ICD-10-CM | POA: Diagnosis not present

## 2021-12-14 DIAGNOSIS — G8929 Other chronic pain: Secondary | ICD-10-CM

## 2021-12-14 DIAGNOSIS — Z23 Encounter for immunization: Secondary | ICD-10-CM

## 2021-12-14 LAB — LIPID PANEL
Cholesterol: 242 mg/dL — ABNORMAL HIGH (ref 0–200)
HDL: 44.9 mg/dL (ref >39.00)
LDL Cholesterol: 176 mg/dL — ABNORMAL HIGH (ref 0–99)
NonHDL: 197.07
Total CHOL/HDL Ratio: 5
Triglycerides: 104 mg/dL (ref 0.0–149.0)
VLDL: 20.8 mg/dL (ref 0.0–40.0)

## 2021-12-14 LAB — POCT GLYCOSYLATED HEMOGLOBIN (HGB A1C): Hemoglobin A1C: 6 % — AB (ref 4.0–5.6)

## 2021-12-14 LAB — COMPREHENSIVE METABOLIC PANEL
ALT: 11 U/L (ref 0–35)
AST: 13 U/L (ref 0–37)
Albumin: 4 g/dL (ref 3.5–5.2)
Alkaline Phosphatase: 61 U/L (ref 39–117)
BUN: 12 mg/dL (ref 6–23)
CO2: 30 mEq/L (ref 19–32)
Calcium: 9 mg/dL (ref 8.4–10.5)
Chloride: 106 mEq/L (ref 96–112)
Creatinine, Ser: 0.77 mg/dL (ref 0.40–1.20)
GFR: 75.7 mL/min (ref >60.00)
Glucose, Bld: 109 mg/dL — ABNORMAL HIGH (ref 70–99)
Potassium: 4 mEq/L (ref 3.5–5.1)
Sodium: 143 mEq/L (ref 135–145)
Total Bilirubin: 0.4 mg/dL (ref 0.2–1.2)
Total Protein: 6.7 g/dL (ref 6.0–8.3)

## 2021-12-14 MED ORDER — CYCLOBENZAPRINE HCL 5 MG PO TABS: 5.0000 mg | ORAL_TABLET | Freq: Every evening | ORAL | 0 refills | Status: DC | PRN

## 2021-12-14 MED ORDER — GLIPIZIDE ER 2.5 MG PO TB24: 2.5000 mg | ORAL_TABLET | Freq: Every day | ORAL | 0 refills | Status: DC

## 2021-12-14 MED ORDER — GABAPENTIN 300 MG PO CAPS: 300.0000 mg | ORAL_CAPSULE | Freq: Every day | ORAL | 0 refills | Status: DC

## 2021-12-14 NOTE — Assessment & Plan Note (Signed)
Overdue, never followed through with referral from April 2023. Referral placed today.

## 2021-12-14 NOTE — Assessment & Plan Note (Signed)
Inappropriate NSAID use.  Remain off of ibuprofen 800 mg at bedtime. Start gabapentin 300 mg at bedtime. Start cyclobenzaprine 5 mg as needed, discussed use sparingly.  She will update if no improvement.

## 2021-12-14 NOTE — Patient Instructions (Signed)
Remain off of Januvia for diabetes.  Start glipizide XL 2.5 mg daily for diabetes.  Take 1 tablet by mouth every morning with breakfast.  Take gabapentin 300 mg at bedtime for pain. Use the cyclobenzaprine (muscle relaxer) as needed for muscle spasms/pain.  Remain off of ibuprofen/Aleve/Motrin/Advil as this can damage your kidneys.  Please schedule a physical to meet with me in 6 months.   It was a pleasure to see you today!

## 2021-12-14 NOTE — Assessment & Plan Note (Signed)
Uncontrolled with last LDL of 200 from April 2023. Discussed the absolute need for patient to call us back/return or messages as she has not since her last visit.  Long discussion today regarding statin therapy and the need to initiate given her medical history. Repeat lipid panel pending today.  She agrees to start statin therapy, await results.

## 2021-12-14 NOTE — Progress Notes (Signed)
Subjective:    Patient ID: Brenda Flores, female    DOB: [birth year], 76 y.o.   MRN: [id number]  HPI  Brenda Flores is a very pleasant 76 y.o. female with a history of hypertension, CAD, type 2 diabetes, CHF, osteoarthritis, hyperlipidemia who presents today for follow up of chronic conditions.  1) Type 2 Diabetes:  Current medications include: Januvia 50 mg daily. She is in the donut hole with Januvia, has been off for 2 weeks and will  be in the hole for 3-4 more weeks. Januvia is still expensive for her when she's not in the donut hole.   She cannot tolerate metformin due to see GI upset. She has never taken Glipizide.  She is checking her blood glucose 0 times daily.  Last A1C: 6.5 in April 2023, 6.0 today. Last Eye Exam: UTD Last Foot Exam: Due  Pneumonia Vaccination: 2018 Urine Microalbumin: UTD Statin: None, patient declined  Dietary changes since last visit: None, picking up food on the road. No more juicing.    Exercise: [employer] walking several days weekly   2) Decreased renal function: Newly noted on labs from April 2023 with GFR of 59.02, creatinine of 0.95. baseline GFR in the high 70's. We attempted to contact patient in April 2023 to discuss her results, but we could not get a hold of her.  Today she mentions that she's been taking ibuprofen 800 mg HS for the last two years for her chronic hip pain. Recently she's been taking cyclobenzaprine and gabapentin 200 mg, daughters medication, this regimen has been has been very helpful. She stopped ibuprofen one week ago.   3) Hyperlipidemia: Chronic for years, has declined statin therapy previously, or didn't ever take when prescribed. During her visit in April 2023 her LDL was 200. We attempted to contact her numerous times to recommend statin therapy.treatment but she never contacted our office back.   She has been reticent to take statins as she is afraid of the potential side effects. She is willing  to try statin therapy.   Review of Systems  Respiratory:  Negative for shortness of breath.   Cardiovascular:  Negative for chest pain.  Musculoskeletal:  Positive for arthralgias.  Neurological:  Negative for dizziness, numbness and headaches.         Past Medical History:  Diagnosis Date   Achilles rupture, right    Bronchitis    CAD (coronary artery disease) Dr Burt Knack   non-obs CAD by cath in 7/07 (mLAD 30-40%, EF 65%)   DDD (degenerative disc disease), lumbar    DJD (degenerative joint disease)    KNEES   GERD (gastroesophageal reflux disease)    History of mitral valve prolapse    PER PT REPORTED MVP  PER DR HARSHAW FROM CARDIAC CATH IN 1987 (APPROX)   History of rectal polyps    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   Hyperlipidemia    Hypertension    Left nephrolithiasis    NON-OBSTRUCTIVE   Liver, polycystic    Lumbar radiculopathy    Osteoporosis    PONV (postoperative nausea and vomiting)    Right ureteral stone    Type 2 diabetes mellitus ([place])     Social History   Socioeconomic History   Marital status: Widowed    Spouse name: Not on file   Number of children: 2   Years of education: Not on file   Highest education level: Not on file  Occupational History   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  Tobacco Use   Smoking status: Never   Smokeless tobacco: Never  Vaping Use   Vaping Use: Never used  Substance and Sexual Activity   Alcohol use: No    Alcohol/week: 0.0 standard drinks of alcohol   Drug use: No   Sexual activity: Not on file  Other Topics Concern   Not on file  Social History Narrative   Not on file   Social Determinants of Health   Financial Resource Strain: Not on file  Food Insecurity: Not on file  Transportation Needs: Not on file  Physical Activity: Not on file  Stress: Not on file  Social Connections: Not on file  Intimate Partner Violence: Not on file    Past Surgical History:  Procedure Laterality Date    ACHILLES TENDON SURGERY Right 05/26/2016   Procedure: RIGHT ACHILLES [place];  Surgeon: Ninetta Lights, MD;  Location: [facility];  Service: Orthopedics;  Laterality: Right;   CARDIAC CATHETERIZATION  10-17-2005   DR COOPER   NON-OBSTRUCTIVE CAD/   mLAD 30-40%/   NORMAL LVF/  EF 65%   CARDIOVASCULAR STRESS TEST  04-12-2011  DR Carleene Overlie TAYLOR   LOW RISK STUDY/  NO ISCHEMIA/  EF 68%   CATARACT EXTRACTION W/ INTRAOCULAR LENS IMPLANT Right 2003   COLONOSCOPY W/ POLYPECTOMY  03-17-2011   CYSTOSCOPY WITH URETEROSCOPY Right 06/14/2013   Procedure: RIGHT  URETEROSCOPY,ureteral and urethral dilitation;  Surgeon: Claybon Jabs, MD;  Location: [facility];  Service: Urology;  Laterality: Right;   HEEL SPUR RESECTION Right 05/26/2016   Procedure: HEEL SPUR EXCISION;  Surgeon: Ninetta Lights, MD;  Location: [facility];  Service: Orthopedics;  Laterality: Right;   HEEL SPUR SURGERY Left 2003   TOTAL ABDOMINAL HYSTERECTOMY W/ BILATERAL SALPINGOOPHORECTOMY  1978   TRANSTHORACIC ECHOCARDIOGRAM  04-07-2011   MILD LVH/  MILD FOCAL BASAL SEPTAL HYPERTROPHY/  EF  55-60% /  GRADE I DIASTOLIC DYSFUNCTION    Family History  Problem Relation Age of Onset   Cancer Mother        breast   Heart disease Mother    Cancer Father        colon   Cancer Brother        ?lung   Heart disease Brother    Heart disease Maternal Aunt    Breast cancer Neg Hx     Allergies  Allergen Reactions   Darvocet [Propoxyphene N-Acetaminophen] Nausea And Vomiting   Propoxyphene Other (See Comments)   Tetanus Toxoids     TDAP hives, pt reports she had TD in the past w/ no issues    Current Outpatient Medications on File Prior to Visit  Medication Sig Dispense Refill   albuterol (PROVENTIL) (2.5 MG/3ML) 0.083% nebulizer solution Take 3 mLs (2.5 mg total) by nebulization every 6 (six) hours as needed for wheezing or shortness of breath. 150 mL 1   amLODipine  (NORVASC) 5 MG tablet TAKE 1 TABLET BY MOUTH EVERY DAY FOR BLOOD PRESSURE 90 tablet 1   carvedilol (COREG) 6.25 MG tablet Take 1 tablet (6.25 mg total) by mouth 2 (two) times daily with a meal. for blood pressure. 180 tablet 1   No current facility-administered medications on file prior to visit.    BP 122/80   Pulse 62   Temp (!) 97.4 F (36.3 C) (Temporal)   Ht 5' 5.5" (1.664 m)   Wt 185 lb (83.9 kg)   SpO2 98%  BMI 30.32 kg/m  Objective:   Physical Exam Cardiovascular:     Rate and Rhythm: Normal rate and regular rhythm.  Pulmonary:     Effort: Pulmonary effort is normal.     Breath sounds: Normal breath sounds.  Musculoskeletal:     Cervical back: Neck supple.  Skin:    General: Skin is warm and dry.           Assessment & Plan:   Problem List Items Addressed This Visit       Endocrine   Diabetes type 2, controlled ([place]) - Primary    Controlled, however, she continues to fall into the donut hole with Januvia 50 mg. Januvia is also cost prohibitive when not in the donut hole.  Discussed A1C results today, offered to monitor levels without treatment, but she would like to remain on some type of treatment. Stop Januvia 50 mg due to cost. Start Glipizide XL 2.5 mg daily. Cannot tolerate metformin due to GI upset.  Foot exam today. Urine microalbumin UTD Discussed statin therapy, she is considering.  Follow up in 6 months.      Relevant Medications   glipiZIDE (GLUCOTROL XL) 2.5 MG 24 hr tablet   Other Relevant Orders   POCT glycosylated hemoglobin (Hb A1C) (Completed)     Musculoskeletal and Integument   Osteoarthritis    Inappropriate NSAID use.  Remain off of ibuprofen 800 mg at bedtime. Start gabapentin 300 mg at bedtime. Start cyclobenzaprine 5 mg as needed, discussed use sparingly.  She will update if no improvement.       Relevant Medications   gabapentin (NEURONTIN) 300 MG capsule   cyclobenzaprine (FLEXERIL) 5 MG tablet     Other    HLD (hyperlipidemia)    Uncontrolled with last LDL of 200 from April 2023. Discussed the absolute need for patient to call us back/return or messages as she has not since her last visit.  Long discussion today regarding statin therapy and the need to initiate given her medical history. Repeat lipid panel pending today.  She agrees to start statin therapy, await results.      Relevant Orders   Lipid panel   Comprehensive metabolic panel   Chronic pain of both hips    Inappropriate NSAID use.  Remain off of ibuprofen 800 mg at bedtime. Start gabapentin 300 mg at bedtime. Start cyclobenzaprine 5 mg as needed, discussed use sparingly.  She will update if no improvement.       Relevant Medications   gabapentin (NEURONTIN) 300 MG capsule   cyclobenzaprine (FLEXERIL) 5 MG tablet   Family history of colon cancer    Overdue, never followed through with referral from April 2023. Referral placed today.      Decreased renal function    Repeat renal function pending today. Discussed the absolute need to stop/remain off of NSAID therapy.      Other Visit Diagnoses     Screening for colon cancer       Relevant Orders   Ambulatory referral to Gastroenterology   Need for immunization against influenza       Relevant Orders   Flu Vaccine QUAD High Dose(Fluad) (Completed)          Pleas Koch, NP

## 2021-12-14 NOTE — Assessment & Plan Note (Signed)
Repeat renal function pending today. Discussed the absolute need to stop/remain off of NSAID therapy.

## 2021-12-14 NOTE — Assessment & Plan Note (Signed)
Controlled, however, she continues to fall into the donut hole with Januvia 50 mg. Januvia is also cost prohibitive when not in the donut hole.  Discussed A1C results today, offered to monitor levels without treatment, but she would like to remain on some type of treatment. Stop Januvia 50 mg due to cost. Start Glipizide XL 2.5 mg daily. Cannot tolerate metformin due to GI upset.  Foot exam today. Urine microalbumin UTD Discussed statin therapy, she is considering.  Follow up in 6 months.

## 2021-12-16 ENCOUNTER — Other Ambulatory Visit: Payer: Self-pay | Admitting: Primary Care

## 2021-12-16 DIAGNOSIS — E785 Hyperlipidemia, unspecified: Secondary | ICD-10-CM

## 2021-12-16 MED ORDER — ROSUVASTATIN CALCIUM 10 MG PO TABS: 10.0000 mg | ORAL_TABLET | Freq: Every day | ORAL | 0 refills | Status: DC

## 2021-12-23 ENCOUNTER — Inpatient Hospital Stay: Admission: RE | Admit: 2021-12-23 | Payer: Medicare HMO | Source: Ambulatory Visit

## 2021-12-30 ENCOUNTER — Other Ambulatory Visit: Payer: Self-pay | Admitting: Primary Care

## 2021-12-30 DIAGNOSIS — E1165 Type 2 diabetes mellitus with hyperglycemia: Secondary | ICD-10-CM

## 2022-02-02 ENCOUNTER — Other Ambulatory Visit: Payer: Self-pay | Admitting: Primary Care

## 2022-02-02 DIAGNOSIS — E785 Hyperlipidemia, unspecified: Secondary | ICD-10-CM

## 2022-02-02 LAB — HM DIABETES EYE EXAM

## 2022-02-04 ENCOUNTER — Other Ambulatory Visit: Payer: Self-pay | Admitting: Primary Care

## 2022-02-04 DIAGNOSIS — M159 Polyosteoarthritis, unspecified: Secondary | ICD-10-CM

## 2022-02-04 DIAGNOSIS — I10 Essential (primary) hypertension: Secondary | ICD-10-CM

## 2022-02-04 DIAGNOSIS — G8929 Other chronic pain: Secondary | ICD-10-CM

## 2022-02-04 DIAGNOSIS — E785 Hyperlipidemia, unspecified: Secondary | ICD-10-CM

## 2022-02-08 ENCOUNTER — Encounter: Payer: Self-pay | Admitting: Primary Care

## 2022-02-15 ENCOUNTER — Other Ambulatory Visit: Payer: Medicare HMO

## 2022-02-23 NOTE — Telephone Encounter (Signed)
Called patient and reviewed all information. Patient verbalized understanding. Will call if any further questions.  

## 2022-03-02 ENCOUNTER — Other Ambulatory Visit: Payer: Self-pay | Admitting: Primary Care

## 2022-03-02 DIAGNOSIS — I10 Essential (primary) hypertension: Secondary | ICD-10-CM

## 2022-03-23 ENCOUNTER — Telehealth (INDEPENDENT_AMBULATORY_CARE_PROVIDER_SITE_OTHER): Payer: Medicare HMO | Admitting: Family Medicine

## 2022-03-23 VITALS — BP 120/80 | Temp 98.0°F | Ht 65.5 in | Wt 189.0 lb

## 2022-03-23 DIAGNOSIS — U071 COVID-19: Secondary | ICD-10-CM | POA: Diagnosis not present

## 2022-03-23 HISTORY — DX: COVID-19: U07.1

## 2022-03-23 MED ORDER — MOLNUPIRAVIR EUA 200MG CAPSULE: 4.0000 | ORAL_CAPSULE | Freq: Two times a day (BID) | ORAL | 0 refills | Status: DC

## 2022-03-23 MED ORDER — ALBUTEROL SULFATE (2.5 MG/3ML) 0.083% IN NEBU: 2.5000 mg | INHALATION_SOLUTION | Freq: Four times a day (QID) | RESPIRATORY_TRACT | 1 refills | Status: AC | PRN

## 2022-03-23 MED ORDER — BENZONATATE 200 MG PO CAPS: 200.0000 mg | ORAL_CAPSULE | Freq: Two times a day (BID) | ORAL | 0 refills | Status: DC | PRN

## 2022-03-23 NOTE — Progress Notes (Signed)
TELEPHONE VISIT  Due to national recommendations of social distancing due to [place] 19, Audio telehealth visit is felt to be most appropriate for this patient at this time.   I connected with [name] on 03/23/22 at  8:40 AM EST by telephone and verified that I am speaking with the correct person using two identifiers.   I discussed the limitations, risks, security and privacy concerns of performing an evaluation and management service by telephone and the availability of in person appointments. I also discussed with the patient that there may be a patient responsible charge related to this service. The patient expressed understanding and agreed to proceed.  Patient location: Home Provider Location: [facility] Participants: Eliezer Lofts and [name]  Chief Complaint  Patient presents with   Covid Positive    Tested positive 03/19/22. Symptoms: nasal drainage, coughing, sore throat and ear ache.      History of Present Illness: 77 year old female patient of Brenda Flores with history of diabetes, coronary artery disease, hypertension and diastolic heart failure.   Date of onset:  12/29.[name] on day 5 of illness Initial symptoms included  ear ache and ST Symptoms progressed to nasal congestion and dry cough.  Now ears feel better but still cough.  No SOB, no wheeze No fever.  Cough keeping her up at night   Sick contacts:  flew back from [place] last week COVID testing:   positive     She has tried to treat with  diabetic tussin, tylenol     No history of chronic lung disease such as asthma or COPD.  When she get sick she has some wheezing occ.. requests nebulizer med albuterol. Non-smoker.   COVID 19 screen No recent travel or known exposure to COVID19 The patient denies respiratory symptoms of COVID 19 at this time.  The importance of social distancing was discussed today.   Review of Systems  Constitutional:  Positive for  malaise/fatigue. Negative for chills and fever.  HENT:  Positive for congestion and ear pain.   Eyes:  Negative for pain and redness.  Respiratory:  Positive for cough. Negative for shortness of breath.   Cardiovascular:  Negative for chest pain, palpitations and leg swelling.  Gastrointestinal:  Negative for abdominal pain, blood in stool, constipation, diarrhea, nausea and vomiting.  Genitourinary:  Negative for dysuria.  Musculoskeletal:  Negative for falls and myalgias.  Skin:  Negative for rash.  Neurological:  Negative for dizziness.  Psychiatric/Behavioral:  Negative for depression. The patient is not nervous/anxious.       Past Medical History:  Diagnosis Date   Achilles rupture, right    Bronchitis    CAD (coronary artery disease) Dr Burt Knack   non-obs CAD by cath in 7/07 (mLAD 30-40%, EF 65%)   DDD (degenerative disc disease), lumbar    DJD (degenerative joint disease)    KNEES   GERD (gastroesophageal reflux disease)    History of mitral valve prolapse    PER PT REPORTED MVP  PER DR HARSHAW FROM CARDIAC CATH IN 1987 (APPROX)   History of rectal polyps    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   Hyperlipidemia    Hypertension    Left nephrolithiasis    NON-OBSTRUCTIVE   Liver, polycystic    Lumbar radiculopathy    Osteoporosis    PONV (postoperative nausea and vomiting)    Right ureteral stone    Type 2 diabetes mellitus ([place])     reports that she  has never smoked. She has never used smokeless tobacco. She reports that she does not drink alcohol and does not use drugs.   Current Outpatient Medications:    albuterol (PROVENTIL) (2.5 MG/3ML) 0.083% nebulizer solution, Take 3 mLs (2.5 mg total) by nebulization every 6 (six) hours as needed for wheezing or shortness of breath., Disp: 150 mL, Rfl: 1   amLODipine (NORVASC) 5 MG tablet, TAKE 1 TABLET BY MOUTH EVERY DAY FOR BLOOD PRESSURE, Disp: 90 tablet, Rfl: 1   carvedilol (COREG) 6.25 MG tablet, Take  1 tablet (6.25 mg total) by mouth 2 (two) times daily with a meal. for blood pressure., Disp: 180 tablet, Rfl: 1   cyclobenzaprine (FLEXERIL) 5 MG tablet, TAKE 1 TABLET BY MOUTH AT BEDTIME AS NEEDED FOR MUSCLE SPASMS., Disp: 30 tablet, Rfl: 0   gabapentin (NEURONTIN) 300 MG capsule, TAKE 1 CAPSULE (300 MG TOTAL) BY MOUTH AT BEDTIME. FOR PAIN, Disp: 90 capsule, Rfl: 1   glipiZIDE (GLUCOTROL XL) 2.5 MG 24 hr tablet, TAKE 1 TABLET (2.5 MG TOTAL) BY MOUTH DAILY WITH BREAKFAST. FOR DIABETES., Disp: 90 tablet, Rfl: 1   rosuvastatin (CRESTOR) 10 MG tablet, TAKE 1 TABLET BY MOUTH DAILY. FOR CHOLESTEROL, Disp: 90 tablet, Rfl: 1   Observations/Objective: Blood pressure 120/80, temperature 98 F (36.7 C), temperature source Oral, height 5' 5.5" (1.664 m), weight 189 lb (85.7 kg).  Physical Exam  Physical Exam Constitutional:      General: The patient is not in acute distress. Pulmonary:     Effort: Pulmonary effort is normal. No respiratory distress.  Neurological:     Mental Status: The patient is alert and oriented to person, place, and time.  Psychiatric:        Mood and Affect: Mood normal.        Behavior: Behavior normal.   Assessment and Plan Problem List Items Addressed This Visit     COVID-19 - Primary    COVID19  infection. No clear sign of bacterial infection at this time. Mild to moderate symptoms on day 5 of illness. No SOB.  No red flags/need for ER visit or in-person exam at respiratory clinic at this time..    Pt high risk for COVID complications given    CAD, DM, HTN,obesity and age.  Discussed options to treat COVID including  Antivirals   Will start molnupiravir course. No interactions.  If SOB begins symptoms worsening.. have low threshold for in-person exam, if severe shortness of breath ER visit recommended.  Will treat cough with Tessalon over-the-counter during the day but if this is not working well she can try benzonatate.  She also requested refill of her  albuterol nebulizer.  She has no diagnosis of asthma but does states she gets wheezy at times with past infections.    Can monitor Oxygen saturation at home with home monitor if able to obtain.  Go to ER if O2 sat < 90% on room air.  Reviewed home care and provided information through [facility].  Recommended quarantine until test returns. If returns positive 5 days isolation recommended. Return to work day 6 and wear mask for 4 more days to complete 10 days. Provided info about prevention of spread of COVID 19.       Relevant Medications   molnupiravir EUA (LAGEVRIO) 200 mg CAPS capsule   Meds ordered this encounter  Medications   albuterol (PROVENTIL) (2.5 MG/3ML) 0.083% nebulizer solution    Sig: Take 3 mLs (2.5 mg total) by nebulization every 6 (six) hours  as needed for wheezing or shortness of breath.    Dispense:  150 mL    Refill:  1   molnupiravir EUA (LAGEVRIO) 200 mg CAPS capsule    Sig: Take 4 capsules (800 mg total) by mouth 2 (two) times daily for 5 days.    Dispense:  40 capsule    Refill:  0   benzonatate (TESSALON) 200 MG capsule    Sig: Take 1 capsule (200 mg total) by mouth 2 (two) times daily as needed for cough.    Dispense:  20 capsule    Refill:  0      I discussed the assessment and treatment plan with the patient. The patient was provided an opportunity to ask questions and all were answered. The patient agreed with the plan and demonstrated an understanding of the instructions.   The patient was advised to call back or seek an in-person evaluation if the symptoms worsen or if the condition fails to improve as anticipated.  I provided 12 minutes of non-face-to-face time during this encounter.   Eliezer Lofts, MD

## 2022-03-23 NOTE — Assessment & Plan Note (Signed)
COVID19  infection. No clear sign of bacterial infection at this time. Mild to moderate symptoms on day 5 of illness. No SOB.  No red flags/need for ER visit or in-person exam at respiratory clinic at this time..    Pt high risk for COVID complications given    CAD, DM, HTN,obesity and age.  Discussed options to treat COVID including  Antivirals   Will start molnupiravir course. No interactions.  If SOB begins symptoms worsening.. have low threshold for in-person exam, if severe shortness of breath ER visit recommended.  Will treat cough with Tessalon over-the-counter during the day but if this is not working well she can try benzonatate.  She also requested refill of her albuterol nebulizer.  She has no diagnosis of asthma but does states she gets wheezy at times with past infections.    Can monitor Oxygen saturation at home with home monitor if able to obtain.  Go to ER if O2 sat < 90% on room air.  Reviewed home care and provided information through [place].  Recommended quarantine until test returns. If returns positive 5 days isolation recommended. Return to work day 6 and wear mask for 4 more days to complete 10 days. Provided info about prevention of spread of COVID 19.

## 2022-04-27 ENCOUNTER — Telehealth: Payer: Self-pay | Admitting: Primary Care

## 2022-04-27 DIAGNOSIS — G8929 Other chronic pain: Secondary | ICD-10-CM

## 2022-04-27 DIAGNOSIS — M159 Polyosteoarthritis, unspecified: Secondary | ICD-10-CM

## 2022-04-27 NOTE — Telephone Encounter (Signed)
Prescription Request  04/27/2022  Is this a "Controlled Substance" medicine? No  LOV: 12/14/2021  What is the name of the medication or equipment? cyclobenzaprine (FLEXERIL) 5 MG tablet   Have you contacted your pharmacy to request a refill? Yes   Which pharmacy would you like this sent to?  [id number] [place] [facility] [address] [place] [address]Phone: [phone]: [phone]    Patient notified that their request is being sent to the clinical staff for review and that they should receive a response within 2 business days.   Please advise at Mobile [phone](mobile)

## 2022-04-28 MED ORDER — CYCLOBENZAPRINE HCL 5 MG PO TABS: ORAL_TABLET | ORAL | 0 refills | Status: DC

## 2022-04-28 NOTE — Telephone Encounter (Signed)
Refills sent to pharmacy. 

## 2022-05-17 ENCOUNTER — Encounter: Payer: Self-pay | Admitting: Family Medicine

## 2022-05-17 ENCOUNTER — Ambulatory Visit (INDEPENDENT_AMBULATORY_CARE_PROVIDER_SITE_OTHER): Payer: Medicare HMO | Admitting: Family Medicine

## 2022-05-17 VITALS — BP 110/66 | HR 76 | Temp 97.2°F | Ht 65.5 in | Wt 181.0 lb

## 2022-05-17 DIAGNOSIS — U071 COVID-19: Secondary | ICD-10-CM | POA: Diagnosis not present

## 2022-05-17 MED ORDER — MOLNUPIRAVIR EUA 200MG CAPSULE: 4.0000 | ORAL_CAPSULE | Freq: Two times a day (BID) | ORAL | 0 refills | Status: AC

## 2022-05-17 MED ORDER — PROMETHAZINE-DM 6.25-15 MG/5ML PO SYRP: 5.0000 mL | ORAL_SOLUTION | Freq: Two times a day (BID) | ORAL | 0 refills | Status: DC | PRN

## 2022-05-17 MED ORDER — NIRMATRELVIR/RITONAVIR (PAXLOVID)TABLET: 3.0000 | ORAL_TABLET | Freq: Two times a day (BID) | ORAL | 0 refills | Status: DC

## 2022-05-17 MED ORDER — MOLNUPIRAVIR EUA 200MG CAPSULE: 4.0000 | ORAL_CAPSULE | Freq: Two times a day (BID) | ORAL | 0 refills | Status: DC

## 2022-05-17 NOTE — Patient Instructions (Addendum)
Start molnupiravir today and use the cough medicine as needed.  Rest and fluids.  Limit contact with others through Friday.  Mask around others after that for 5 days.  Take care.  Glad to see you. If worse, then go to the ER.

## 2022-05-17 NOTE — Progress Notes (Unsigned)
Sx started about 2 days ago with ear pain.  Then tested positive.  Diffuse aches.  Chest congestion, cough.  Taking otc cough meds in the meantime.  Had some leftover phenergan cough syrup and that helped with cough.  No ADE on med.  No fevers today.  Had temp 102 prev, ie 2 days ago.  Some chills.   Most recent GFR 75.  Taking crestor at baseline.    Meds, vitals, and allergies reviewed.   ROS: Per HPI unless specifically indicated in ROS section   Nad Ncat Neck supple, no LA Rrr Ctab Abd soft, not ttp Occ dry cough but no focal dec in BS.   Skin well perfused.

## 2022-05-18 NOTE — Assessment & Plan Note (Signed)
Discussed options.  Start molnupiravir today and use the promethazine dextromethorphan cough syrup as needed.  Rest and fluids.  Limit contact with others through Friday.  Mask around others after that for 5 days.  If worse, then go to the ER.  She agrees with plan.

## 2022-06-01 ENCOUNTER — Other Ambulatory Visit: Payer: Self-pay | Admitting: Primary Care

## 2022-06-01 DIAGNOSIS — I10 Essential (primary) hypertension: Secondary | ICD-10-CM

## 2022-06-14 ENCOUNTER — Ambulatory Visit: Payer: Medicare HMO | Admitting: Primary Care

## 2022-08-02 ENCOUNTER — Ambulatory Visit (INDEPENDENT_AMBULATORY_CARE_PROVIDER_SITE_OTHER): Payer: Medicare HMO | Admitting: Primary Care

## 2022-08-02 ENCOUNTER — Encounter: Payer: Self-pay | Admitting: Primary Care

## 2022-08-02 VITALS — BP 170/96 | HR 65 | Temp 97.7°F | Ht 65.5 in | Wt 186.0 lb

## 2022-08-02 DIAGNOSIS — M25551 Pain in right hip: Secondary | ICD-10-CM

## 2022-08-02 DIAGNOSIS — E785 Hyperlipidemia, unspecified: Secondary | ICD-10-CM | POA: Diagnosis not present

## 2022-08-02 DIAGNOSIS — Z7984 Long term (current) use of oral hypoglycemic drugs: Secondary | ICD-10-CM

## 2022-08-02 DIAGNOSIS — E1165 Type 2 diabetes mellitus with hyperglycemia: Secondary | ICD-10-CM | POA: Diagnosis not present

## 2022-08-02 LAB — LIPID PANEL
Cholesterol: 231 mg/dL — ABNORMAL HIGH (ref 0–200)
HDL: 52.2 mg/dL (ref >39.00)
LDL Cholesterol: 150 mg/dL — ABNORMAL HIGH (ref 0–99)
NonHDL: 179.29
Total CHOL/HDL Ratio: 4
Triglycerides: 145 mg/dL (ref 0.0–149.0)
VLDL: 29 mg/dL (ref 0.0–40.0)

## 2022-08-02 LAB — HEPATIC FUNCTION PANEL
ALT: 13 U/L (ref 0–35)
AST: 19 U/L (ref 0–37)
Albumin: 4.1 g/dL (ref 3.5–5.2)
Alkaline Phosphatase: 60 U/L (ref 39–117)
Bilirubin, Direct: 0.1 mg/dL (ref 0.0–0.3)
Total Bilirubin: 0.5 mg/dL (ref 0.2–1.2)
Total Protein: 7.4 g/dL (ref 6.0–8.3)

## 2022-08-02 LAB — POCT GLYCOSYLATED HEMOGLOBIN (HGB A1C): Hemoglobin A1C: 5.8 % — AB (ref 4.0–5.6)

## 2022-08-02 LAB — MICROALBUMIN / CREATININE URINE RATIO
Creatinine,U: 241.6 mg/dL
Microalb Creat Ratio: 1.2 mg/g (ref 0.0–30.0)
Microalb, Ur: 2.9 mg/dL — ABNORMAL HIGH (ref 0.0–1.9)

## 2022-08-02 MED ORDER — OZEMPIC (0.25 OR 0.5 MG/DOSE) 2 MG/3ML ~~LOC~~ SOPN
PEN_INJECTOR | SUBCUTANEOUS | 0 refills | Status: DC
Start: 2022-08-02 — End: 2022-10-19

## 2022-08-02 MED ORDER — KETOROLAC TROMETHAMINE 60 MG/2ML IM SOLN
60.0000 mg | Freq: Once | INTRAMUSCULAR | Status: AC
Start: 2022-08-02 — End: 2022-08-02
  Administered 2022-08-02: 60 mg via INTRAMUSCULAR

## 2022-08-02 MED ORDER — PREDNISONE 20 MG PO TABS
ORAL_TABLET | ORAL | 0 refills | Status: DC
Start: 2022-08-02 — End: 2022-12-06

## 2022-08-02 NOTE — Assessment & Plan Note (Addendum)
Controlled and improved with A1C of 5.8.   Stop Glipizide XL 2.5 mg daily.  Start semaglutide (Ozempic) for diabetes. Inject 0.25 mg into the skin once weekly for 4 weeks, then increase to 0.5 mg once weekly thereafter.  We discussed common side effects, instructions for starting.   Follow up in September 2023. Urine microalbumin due and pending.

## 2022-08-02 NOTE — Assessment & Plan Note (Signed)
Continue rosuvastatin 10 mg daily. Repeat lipids and LFT's pending.

## 2022-08-02 NOTE — Assessment & Plan Note (Signed)
Acute on chronic.  Exam today suggestive of MSK etiology. Lower suspicion for renal stones, GI involvement.   IM Toradol 60 mg provided today. Move gabapentin 300 mg HS along with cyclobenzaprine 5 mg HS. Add cyclobenzaprine 2.5 mg BID during the day if needed.  Start prednisone tablets. Take two tablets my mouth once daily in the morning for four days, then one tablet once daily in the morning for four days.   Discussed walking and stretching. Heat/ice.

## 2022-08-02 NOTE — Patient Instructions (Addendum)
Start prednisone tablets. Take two tablets my mouth once daily in the morning for four days, then one tablet once daily in the morning for four days.   Take your gabapentin 300 mg and cyclobenzaprine 5 mg tablet at bedtime.  You can take 2.5 mg of cyclobenzaprine twice daily if needed.  Start semaglutide (Ozempic) for diabetes. Start by injecting 0.25 mg into the skin once weekly for 4 weeks, then increase to 0.5 mg once weekly thereafter.   Stop by the lab prior to leaving today. I will notify you of your results once received.   Please schedule a physical to meet with me in September.   It was a pleasure to see you today!

## 2022-08-02 NOTE — Addendum Note (Signed)
Addended by: Doreene Nest on: 08/02/2022 11:13 AM   Modules accepted: Orders

## 2022-08-02 NOTE — Progress Notes (Addendum)
Subjective:    Patient ID: Brenda Flores, female    DOB: [birth year], 77 y.o.   MRN: [id number]  Hip Pain  Pertinent negatives include no numbness.    Brenda Flores is a very pleasant 77 y.o. female with a history of hypertension, CHF, CAD, type 2 diabetes, lumbar radiculopathy, chronic neck pain, osteoarthritis, chronic bilateral hip pain, chronic back pain who presents today to discuss hip pain and is here for follow up of diabetes.  1) Acute on Chronic Hip Pain: History of chronic bilateral hip pain. Her pain is currently located to the right hip and right lower back for which she originally noticed about 2 weeks ago. Her pain is worse when bending forward, dressing herself with pants, laying in certain positions, getting out of bed.   She denies injury, trauma, urinary urgency, dysuria, hematuria, numbness, lower extremity weakness, radiation of pain to lower extremities. She's tried taking her routine gabapentin 300 mg in AM, cyclobenzaprine 5 mg HS, and Tylenol without improvement.    BP Readings from Last 3 Encounters:  08/02/22 (!) 170/96  05/17/22 110/66  03/23/22 120/80   2) Type 2 Diabetes:  Current medications include: Glipizide XL 2.5 mg daily.  She is interested in switching to Ozempic for diabetes.   Last A1C: 6.0 in September 2023 Last Eye Exam: UTD Last Foot Exam: UTD Pneumonia Vaccination: 2018 Urine Microalbumin:  Due Statin: rosuvastatin      Review of Systems  Musculoskeletal:  Positive for arthralgias, back pain and myalgias.  Neurological:  Negative for weakness and numbness.         Past Medical History:  Diagnosis Date   Achilles rupture, right    Bronchitis    CAD (coronary artery disease) Dr Excell Seltzer   non-obs CAD by cath in 7/07 (mLAD 30-40%, EF 65%)   COVID-19 03/23/2022   DDD (degenerative disc disease), lumbar    DJD (degenerative joint disease)    KNEES   GERD (gastroesophageal reflux disease)    History of  mitral valve prolapse    PER PT REPORTED [facility]  PER DR HARSHAW FROM CARDIAC CATH IN 1987 (APPROX)   History of rectal polyps    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   Hyperlipidemia    Hypertension    Left nephrolithiasis    NON-OBSTRUCTIVE   Liver, polycystic    Lumbar radiculopathy    Osteoporosis    PONV (postoperative nausea and vomiting)    Right ureteral stone    Type 2 diabetes mellitus (HCC)     Social History   Socioeconomic History   Marital status: Widowed    Spouse name: Not on file   Number of children: 2   Years of education: Not on file   Highest education level: Not on file  Occupational History   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  Tobacco Use   Smoking status: Never   Smokeless tobacco: Never  Vaping Use   Vaping Use: Never used  Substance and Sexual Activity   Alcohol use: No    Alcohol/week: 0.0 standard drinks of alcohol   Drug use: No   Sexual activity: Not on file  Other Topics Concern   Not on file  Social History Narrative   Not on file   Social Determinants of Health   Financial Resource Strain: Not on file  Food Insecurity: Not on file  Transportation Needs: Not on file  Physical Activity: Not on file  Stress: Not on file  Social Connections: Not  on file  Intimate Partner Violence: Not on file    Past Surgical History:  Procedure Laterality Date   ACHILLES TENDON SURGERY Right 05/26/2016   Procedure: RIGHT ACHILLES TENDON REPAIR,CALCANEUS SPUR EXCISION;  Surgeon: Loreta Ave, MD;  Location: [facility];  Service: Orthopedics;  Laterality: Right;   CARDIAC CATHETERIZATION  10-17-2005   DR COOPER   NON-OBSTRUCTIVE CAD/   mLAD 30-40%/   NORMAL LVF/  EF 65%   CARDIOVASCULAR STRESS TEST  04-12-2011  DR Sharlot Gowda TAYLOR   LOW RISK STUDY/  NO ISCHEMIA/  EF 68%   CATARACT EXTRACTION W/ INTRAOCULAR LENS IMPLANT Right 2003   COLONOSCOPY W/ POLYPECTOMY  03-17-2011   CYSTOSCOPY WITH URETEROSCOPY Right 06/14/2013    Procedure: RIGHT  URETEROSCOPY,ureteral and urethral dilitation;  Surgeon: Garnett Farm, MD;  Location: [facility];  Service: Urology;  Laterality: Right;   HEEL SPUR RESECTION Right 05/26/2016   Procedure: HEEL SPUR EXCISION;  Surgeon: Loreta Ave, MD;  Location: [facility];  Service: Orthopedics;  Laterality: Right;   HEEL SPUR SURGERY Left 2003   TOTAL ABDOMINAL HYSTERECTOMY W/ BILATERAL SALPINGOOPHORECTOMY  1978   TRANSTHORACIC ECHOCARDIOGRAM  04-07-2011   MILD LVH/  MILD FOCAL BASAL SEPTAL HYPERTROPHY/  EF  55-60% /  GRADE I DIASTOLIC DYSFUNCTION    Family History  Problem Relation Age of Onset   Cancer Mother        breast   Heart disease Mother    Cancer Father        colon   Cancer Brother        ?lung   Heart disease Brother    Heart disease Maternal Aunt    Breast cancer Neg Hx     Allergies  Allergen Reactions   Darvocet [Propoxyphene N-Acetaminophen] Nausea And Vomiting   Propoxyphene Other (See Comments)   Tetanus Toxoids     TDAP hives, pt reports she had TD in the past w/ no issues    Current Outpatient Medications on File Prior to Visit  Medication Sig Dispense Refill   albuterol (PROVENTIL) (2.5 MG/3ML) 0.083% nebulizer solution Take 3 mLs (2.5 mg total) by nebulization every 6 (six) hours as needed for wheezing or shortness of breath. 150 mL 1   amLODipine (NORVASC) 5 MG tablet TAKE 1 TABLET BY MOUTH EVERY DAY FOR BLOOD PRESSURE 90 tablet 1   carvedilol (COREG) 6.25 MG tablet TAKE 1 TABLET (6.25 MG TOTAL) BY MOUTH 2 (TWO) TIMES DAILY WITH A MEAL. FOR BLOOD PRESSURE. 180 tablet 1   cyclobenzaprine (FLEXERIL) 5 MG tablet TAKE 1 TABLET BY MOUTH AT BEDTIME AS NEEDED FOR MUSCLE SPASMS. 30 tablet 0   gabapentin (NEURONTIN) 300 MG capsule TAKE 1 CAPSULE (300 MG TOTAL) BY MOUTH AT BEDTIME. FOR PAIN 90 capsule 1   glipiZIDE (GLUCOTROL XL) 2.5 MG 24 hr tablet TAKE 1 TABLET (2.5 MG TOTAL) BY MOUTH DAILY WITH BREAKFAST. FOR DIABETES. 90  tablet 1   rosuvastatin (CRESTOR) 10 MG tablet TAKE 1 TABLET BY MOUTH DAILY. FOR CHOLESTEROL 90 tablet 1   No current facility-administered medications on file prior to visit.    BP (!) 170/96   Pulse 65   Temp 97.7 F (36.5 C) (Temporal)   Ht 5' 5.5" (1.664 m)   Wt 186 lb (84.4 kg)   SpO2 98%   BMI 30.48 kg/m  Objective:   Physical Exam Cardiovascular:     Rate and Rhythm: Normal rate and regular rhythm.  Pulmonary:  Effort: Pulmonary effort is normal.     Breath sounds: Normal breath sounds.  Musculoskeletal:     Cervical back: Neck supple.     Lumbar back: No bony tenderness. Decreased range of motion. Negative right straight leg raise test and negative left straight leg raise test.       Back:     Comments: Decrease in ROM with pain with lumbar flexion, extension. Ambulates in office without assistance.   Skin:    General: Skin is warm and dry.           Assessment & Plan:  Acute hip pain, right Assessment & Plan: Acute on chronic.  Exam today suggestive of MSK etiology. Lower suspicion for renal stones, GI involvement.   IM Toradol 60 mg provided today. Move gabapentin 300 mg HS along with cyclobenzaprine 5 mg HS. Add cyclobenzaprine 2.5 mg BID during the day if needed.  Start prednisone tablets. Take two tablets my mouth once daily in the morning for four days, then one tablet once daily in the morning for four days.   Discussed walking and stretching. Heat/ice.   Orders: -     Ketorolac Tromethamine -     predniSONE; Take two tablets my mouth once daily in the morning for four days, then one tablet once daily in the morning for four days.  Dispense: 12 tablet; Refill: 0  Controlled type 2 diabetes mellitus with hyperglycemia, without long-term current use of insulin (HCC) Assessment & Plan: Controlled and improved with A1C of 5.8.   Stop Glipizide XL 2.5 mg daily.  Start semaglutide (Ozempic) for diabetes. Inject 0.25 mg into the skin once  weekly for 4 weeks, then increase to 0.5 mg once weekly thereafter.  We discussed common side effects, instructions for starting.   Follow up in September 2023. Urine microalbumin due and pending.  Orders: -     POCT glycosylated hemoglobin (Hb A1C) -     Microalbumin / creatinine urine ratio -     Ozempic (0.25 or 0.5 MG/DOSE); inject 0.25 mg into the skin once weekly for 4 weeks, then increase to 0.5 mg once weekly thereafter for diabetes.  Dispense: 9 mL; Refill: 0  Hyperlipidemia, unspecified hyperlipidemia type Assessment & Plan: Continue rosuvastatin 10 mg daily. Repeat lipids and LFT's pending.  Orders: -     Lipid panel -     Hepatic function panel        Doreene Nest, NP

## 2022-08-26 ENCOUNTER — Telehealth: Payer: Self-pay

## 2022-08-26 NOTE — Patient Outreach (Signed)
  Care Coordination   08/26/2022 Name: Brenda Flores MRN: [id number] DOB: May 17, 1945   Care Coordination Outreach Attempts:  An unsuccessful telephone outreach was attempted today to offer the patient information about available care coordination services.  Follow Up Plan:  Additional outreach attempts will be made to offer the patient care coordination information and services.   Encounter Outcome:  No Answer   Care Coordination Interventions:  No, not indicated    George Ina [facility] Care Coordination [phone] direct line

## 2022-09-20 ENCOUNTER — Other Ambulatory Visit: Payer: Self-pay | Admitting: Primary Care

## 2022-09-20 DIAGNOSIS — E785 Hyperlipidemia, unspecified: Secondary | ICD-10-CM

## 2022-10-03 ENCOUNTER — Other Ambulatory Visit: Payer: Medicare HMO

## 2022-10-18 ENCOUNTER — Other Ambulatory Visit: Payer: Self-pay | Admitting: Primary Care

## 2022-10-18 ENCOUNTER — Telehealth: Payer: Self-pay | Admitting: Primary Care

## 2022-10-18 DIAGNOSIS — E1165 Type 2 diabetes mellitus with hyperglycemia: Secondary | ICD-10-CM

## 2022-10-18 NOTE — Telephone Encounter (Signed)
Prescription Request  10/18/2022  LOV: 08/02/2022  What is the name of the medication or equipment?  Semaglutide,0.25 or 0.5MG /DOS, (OZEMPIC, 0.25 OR 0.5 MG/DOSE,) 2 MG/3ML SOPN  Have you contacted your pharmacy to request a refill? No   Which pharmacy would you like this sent to?  [id number] [place] - [address] [place] [address] Phone: [phone] Fax: [phone]     Patient notified that their request is being sent to the clinical staff for review and that they should receive a response within 2 business days.   Please advise at Mobile [phone] (mobile)   Pt states her meds were in a bag & was seem to be thrown away by her children, when they were cleaning her vehicle. Pt also requested a copy of her most recent covid testing.

## 2022-10-19 NOTE — Telephone Encounter (Signed)
Refill(s) sent to pharmacy.  I do not see recent Covid-19 testing.  If she had this done at an Urgent Care outside of [facility], then she will need to call the urgent care.

## 2022-10-19 NOTE — Telephone Encounter (Signed)
Unable to reach patient. Left voicemail to return call to our office.   

## 2022-10-20 NOTE — Telephone Encounter (Signed)
Pt called back returning Kelli's call. Told pt Clark's response, pt had no questions/concerns. Call back [phone]).

## 2022-11-03 ENCOUNTER — Encounter (INDEPENDENT_AMBULATORY_CARE_PROVIDER_SITE_OTHER): Payer: Self-pay

## 2022-12-02 ENCOUNTER — Encounter: Payer: Self-pay | Admitting: Pharmacist

## 2022-12-06 ENCOUNTER — Encounter: Payer: Self-pay | Admitting: Primary Care

## 2022-12-06 ENCOUNTER — Ambulatory Visit (INDEPENDENT_AMBULATORY_CARE_PROVIDER_SITE_OTHER): Payer: Medicare HMO | Admitting: Primary Care

## 2022-12-06 VITALS — BP 142/78 | HR 65 | Temp 97.4°F | Ht 65.5 in | Wt 186.0 lb

## 2022-12-06 DIAGNOSIS — M25551 Pain in right hip: Secondary | ICD-10-CM | POA: Diagnosis not present

## 2022-12-06 DIAGNOSIS — E1165 Type 2 diabetes mellitus with hyperglycemia: Secondary | ICD-10-CM | POA: Diagnosis not present

## 2022-12-06 DIAGNOSIS — I10 Essential (primary) hypertension: Secondary | ICD-10-CM | POA: Diagnosis not present

## 2022-12-06 DIAGNOSIS — M25552 Pain in left hip: Secondary | ICD-10-CM

## 2022-12-06 DIAGNOSIS — G8929 Other chronic pain: Secondary | ICD-10-CM

## 2022-12-06 DIAGNOSIS — E785 Hyperlipidemia, unspecified: Secondary | ICD-10-CM | POA: Diagnosis not present

## 2022-12-06 DIAGNOSIS — E2839 Other primary ovarian failure: Secondary | ICD-10-CM | POA: Diagnosis not present

## 2022-12-06 DIAGNOSIS — M545 Low back pain, unspecified: Secondary | ICD-10-CM

## 2022-12-06 DIAGNOSIS — I2510 Atherosclerotic heart disease of native coronary artery without angina pectoris: Secondary | ICD-10-CM

## 2022-12-06 DIAGNOSIS — K7689 Other specified diseases of liver: Secondary | ICD-10-CM

## 2022-12-06 DIAGNOSIS — Z0000 Encounter for general adult medical examination without abnormal findings: Secondary | ICD-10-CM

## 2022-12-06 DIAGNOSIS — Z1211 Encounter for screening for malignant neoplasm of colon: Secondary | ICD-10-CM

## 2022-12-06 DIAGNOSIS — Z1231 Encounter for screening mammogram for malignant neoplasm of breast: Secondary | ICD-10-CM

## 2022-12-06 DIAGNOSIS — D229 Melanocytic nevi, unspecified: Secondary | ICD-10-CM

## 2022-12-06 LAB — LIPID PANEL
Cholesterol: 197 mg/dL (ref 0–200)
HDL: 53.5 mg/dL (ref >39.00)
LDL Cholesterol: 113 mg/dL — ABNORMAL HIGH (ref 0–99)
NonHDL: 143.2
Total CHOL/HDL Ratio: 4
Triglycerides: 152 mg/dL — ABNORMAL HIGH (ref 0.0–149.0)
VLDL: 30.4 mg/dL (ref 0.0–40.0)

## 2022-12-06 LAB — COMPREHENSIVE METABOLIC PANEL
ALT: 14 U/L (ref 0–35)
AST: 19 U/L (ref 0–37)
Albumin: 4.1 g/dL (ref 3.5–5.2)
Alkaline Phosphatase: 70 U/L (ref 39–117)
BUN: 13 mg/dL (ref 6–23)
CO2: 27 meq/L (ref 19–32)
Calcium: 9.1 mg/dL (ref 8.4–10.5)
Chloride: 107 meq/L (ref 96–112)
Creatinine, Ser: 0.75 mg/dL (ref 0.40–1.20)
GFR: 77.6 mL/min (ref >60.00)
Glucose, Bld: 78 mg/dL (ref 70–99)
Potassium: 4.1 meq/L (ref 3.5–5.1)
Sodium: 142 meq/L (ref 135–145)
Total Bilirubin: 0.3 mg/dL (ref 0.2–1.2)
Total Protein: 7.2 g/dL (ref 6.0–8.3)

## 2022-12-06 LAB — CBC
HCT: 41 % (ref 36.0–46.0)
Hemoglobin: 13.1 g/dL (ref 12.0–15.0)
MCHC: 32 g/dL (ref 30.0–36.0)
MCV: 89 fl (ref 78.0–100.0)
Platelets: 207 10*3/uL (ref 150.0–400.0)
RBC: 4.61 Mil/uL (ref 3.87–5.11)
RDW: 16.1 % — ABNORMAL HIGH (ref 11.5–15.5)
WBC: 5.4 10*3/uL (ref 4.0–10.5)

## 2022-12-06 LAB — HEMOGLOBIN A1C: Hgb A1c MFr Bld: 6.1 % (ref 4.6–6.5)

## 2022-12-06 MED ORDER — BLOOD GLUCOSE MONITORING SUPPL DEVI
1.0000 | Freq: Three times a day (TID) | 0 refills | Status: AC
Start: 2022-12-06 — End: ?

## 2022-12-06 MED ORDER — BLOOD GLUCOSE TEST VI STRP
1.0000 | ORAL_STRIP | Freq: Three times a day (TID) | 0 refills | Status: AC
Start: 2022-12-06 — End: ?

## 2022-12-06 MED ORDER — LANCETS MISC. MISC
1.0000 | Freq: Three times a day (TID) | 0 refills | Status: AC
Start: 2022-12-06 — End: ?

## 2022-12-06 MED ORDER — LANCET DEVICE MISC
1.0000 | Freq: Three times a day (TID) | 0 refills | Status: AC
Start: 2022-12-06 — End: 2023-01-05

## 2022-12-06 NOTE — Assessment & Plan Note (Signed)
Repeat lipid panel pending.  Discussed the importance of a healthy diet and regular exercise in order for weight loss, and to reduce the risk of further co-morbidity. Continue rosuvastatin 10 mg daily.

## 2022-12-06 NOTE — Patient Instructions (Addendum)
Stop by the lab prior to leaving today. I will notify you of your results once received.   You will receive a phone call regarding the liver ultrasound.  You will either be contacted via phone regarding your referral to dermatology and GI for the colonoscopy, or you may receive a letter on your MyChart portal from our referral team with instructions for scheduling an appointment. Please let us know if you have not been contacted by anyone within two weeks.  Start monitoring your blood pressure daily, around the same time of day, for the next 2-3 weeks.  Ensure that you have rested for 30 minutes prior to checking your blood pressure.   Record your readings and notify me if you see numbers consistently at or above 140 on top and/or 90 on bottom.  [facility] to schedule your mammogram and bone density scan.   Start checking your blood sugar levels.  Appropriate times to check your blood sugar levels are:  -Before any meal (breakfast, lunch, dinner) -Two hours after any meal (breakfast, lunch, dinner) -Bedtime  Record your readings and notify me if you continue to consistently run at or above 150.   Please schedule a follow up visit for 6 months for a diabetes check.  It was a pleasure to see you today!

## 2022-12-06 NOTE — Assessment & Plan Note (Addendum)
Repeat A1c pending. Unfortunately, Ozempic has become cost prohibitive.  She has not taken in 1 month.  Remain off of Ozempic for now. Will clarify if she is taking glipizide XL 2.5 mg daily.  Await A1c results to determine steps.  Prescription for glucometer kit sent to pharmacy.  Instructions for checking glucose levels provided today. Discussed to notify if glucose levels remain at or above 150 consistently.  Consider resuming treatment if this is the case.  Follow-up in 6 months.

## 2022-12-06 NOTE — Assessment & Plan Note (Signed)
Stable.  Continue gabapentin 300 mg HS. Continue Flexeril 5 mg PRN.

## 2022-12-06 NOTE — Assessment & Plan Note (Signed)
Asymptomatic.   Repeat lipids and A1C today.

## 2022-12-06 NOTE — Assessment & Plan Note (Signed)
Slightly above goal today, however, she has not been taking her second dose of carvedilol.  Resume carvedilol 6.25 mg twice daily. Continue amlodipine 5 mg daily.  She will start monitoring blood pressure at home and notify if readings are consistently at or above 140/90. CMP pending.

## 2022-12-06 NOTE — Assessment & Plan Note (Signed)
Stable.  Continue gabapentin 300 mg HS PRN. Continue Flexeril 5 mg PRN.

## 2022-12-06 NOTE — Assessment & Plan Note (Signed)
Discussed to complete second Shingrix vaccine at the pharmacy. Declines influenza vaccine.  Mammogram and bone density scan due, orders placed. Colonoscopy overdue, she agrees to complete. Referral placed to GI  Discussed the importance of a healthy diet and regular exercise in order for weight loss, and to reduce the risk of further co-morbidity.  Exam stable. Labs pending.  Follow up in 1 year for repeat physical.

## 2022-12-06 NOTE — Assessment & Plan Note (Signed)
Chronic, no updated imaging on file. Will obtain liver US. She agrees.

## 2022-12-06 NOTE — Progress Notes (Signed)
Subjective:    Patient ID: Brenda Flores, female    DOB: 04-Aug-1945, 77 y.o.   MRN: [id number]  HPI  Brenda Flores is a very pleasant 77 y.o. female who presents today for complete physical and follow up of chronic conditions.  Immunizations: -Tetanus: Completed in 2019 -Influenza: Declines influenza vaccine.  -Shingles: Completed 1 Shingrix vaccine at pharmacy  -Pneumonia: Completed Prevnar 13 in 2018, Pneumovax 23 in 2016  Diet: Fair diet.  Exercise: Exercises at the gym, active at work.  Eye exam: Completed several years ago.  Dental exam: Completed several years ago  Mammogram: Completed last in April 2023 Bone Density Scan: Completed last in 2019  Colonoscopy: Completed > 10 years ago. Never completed last year.   BP Readings from Last 3 Encounters:  12/06/22 (!) 142/78  08/02/22 (!) 170/96  05/17/22 110/66     Review of Systems  Constitutional:  Negative for unexpected weight change.  HENT:  Negative for rhinorrhea.   Respiratory:  Negative for cough and shortness of breath.   Cardiovascular:  Negative for chest pain.  Gastrointestinal:  Negative for constipation and diarrhea.  Genitourinary:  Negative for difficulty urinating.  Musculoskeletal:  Positive for arthralgias.  Skin:  Negative for rash.  Allergic/Immunologic: Negative for environmental allergies.  Neurological:  Negative for dizziness and headaches.  Psychiatric/Behavioral:  The patient is not nervous/anxious.          Past Medical History:  Diagnosis Date   Achilles rupture, right    Acute foot pain, left 09/26/2018   Bronchitis    CAD (coronary artery disease) Dr Excell Seltzer   non-obs CAD by cath in 7/07 (mLAD 30-40%, EF 65%)   COVID-19 03/23/2022   DDD (degenerative disc disease), lumbar    DJD (degenerative joint disease)    KNEES   GERD (gastroesophageal reflux disease)    History of mitral valve prolapse    PER PT REPORTED MVP  PER DR HARSHAW FROM CARDIAC CATH  IN 1987 (APPROX)   History of rectal polyps    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   Hyperlipidemia    Hypertension    Left nephrolithiasis    NON-OBSTRUCTIVE   Liver, polycystic    Lumbar radiculopathy    Osteoporosis    PONV (postoperative nausea and vomiting)    Right ureteral stone    Shoulder pain 05/30/2018   Type 2 diabetes mellitus (HCC)     Social History   Socioeconomic History   Marital status: Widowed    Spouse name: Not on file   Number of children: 2   Years of education: Not on file   Highest education level: Not on file  Occupational History   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  Tobacco Use   Smoking status: Never   Smokeless tobacco: Never  Vaping Use   Vaping status: Never Used  Substance and Sexual Activity   Alcohol use: No    Alcohol/week: 0.0 standard drinks of alcohol   Drug use: No   Sexual activity: Not on file  Other Topics Concern   Not on file  Social History Narrative   Not on file   Social Determinants of Health   Financial Resource Strain: Not on file  Food Insecurity: Not on file  Transportation Needs: Not on file  Physical Activity: Not on file  Stress: Not on file  Social Connections: Not on file  Intimate Partner Violence: Not on file    Past Surgical History:  Procedure Laterality Date  ACHILLES TENDON SURGERY Right 05/26/2016   Procedure: RIGHT ACHILLES TENDON REPAIR,CALCANEUS SPUR EXCISION;  Surgeon: Loreta Ave, MD;  Location: [facility];  Service: Orthopedics;  Laterality: Right;   CARDIAC CATHETERIZATION  10-17-2005   DR COOPER   NON-OBSTRUCTIVE CAD/   mLAD 30-40%/   NORMAL LVF/  EF 65%   CARDIOVASCULAR STRESS TEST  04-12-2011  DR Sharlot Gowda TAYLOR   LOW RISK STUDY/  NO ISCHEMIA/  EF 68%   CATARACT EXTRACTION W/ INTRAOCULAR LENS IMPLANT Right 2003   COLONOSCOPY W/ POLYPECTOMY  03-17-2011   CYSTOSCOPY WITH URETEROSCOPY Right 06/14/2013   Procedure: RIGHT  URETEROSCOPY,ureteral and urethral  dilitation;  Surgeon: Garnett Farm, MD;  Location: [facility];  Service: Urology;  Laterality: Right;   HEEL SPUR RESECTION Right 05/26/2016   Procedure: HEEL SPUR EXCISION;  Surgeon: Loreta Ave, MD;  Location: [facility];  Service: Orthopedics;  Laterality: Right;   HEEL SPUR SURGERY Left 2003   TOTAL ABDOMINAL HYSTERECTOMY W/ BILATERAL SALPINGOOPHORECTOMY  1978   TRANSTHORACIC ECHOCARDIOGRAM  04-07-2011   MILD LVH/  MILD FOCAL BASAL SEPTAL HYPERTROPHY/  EF  55-60% /  GRADE I DIASTOLIC DYSFUNCTION    Family History  Problem Relation Age of Onset   Cancer Mother        breast   Heart disease Mother    Cancer Father        colon   Cancer Brother        ?lung   Heart disease Brother    Heart disease Maternal Aunt    Breast cancer Neg Hx     Allergies  Allergen Reactions   Darvocet [Propoxyphene N-Acetaminophen] Nausea And Vomiting   Propoxyphene Other (See Comments)   Tetanus Toxoids     TDAP hives, pt reports she had TD in the past w/ no issues    Current Outpatient Medications on File Prior to Visit  Medication Sig Dispense Refill   albuterol (PROVENTIL) (2.5 MG/3ML) 0.083% nebulizer solution Take 3 mLs (2.5 mg total) by nebulization every 6 (six) hours as needed for wheezing or shortness of breath. 150 mL 1   amLODipine (NORVASC) 5 MG tablet TAKE 1 TABLET BY MOUTH EVERY DAY FOR BLOOD PRESSURE 90 tablet 1   carvedilol (COREG) 6.25 MG tablet TAKE 1 TABLET (6.25 MG TOTAL) BY MOUTH 2 (TWO) TIMES DAILY WITH A MEAL. FOR BLOOD PRESSURE. 180 tablet 1   cyclobenzaprine (FLEXERIL) 5 MG tablet TAKE 1 TABLET BY MOUTH AT BEDTIME AS NEEDED FOR MUSCLE SPASMS. 30 tablet 0   gabapentin (NEURONTIN) 300 MG capsule TAKE 1 CAPSULE (300 MG TOTAL) BY MOUTH AT BEDTIME. FOR PAIN 90 capsule 1   glipiZIDE (GLUCOTROL XL) 2.5 MG 24 hr tablet TAKE 1 TABLET (2.5 MG TOTAL) BY MOUTH DAILY WITH BREAKFAST. FOR DIABETES. 90 tablet 1   rosuvastatin (CRESTOR) 10 MG tablet TAKE 1  TABLET BY MOUTH DAILY. FOR CHOLESTEROL 90 tablet 1   Semaglutide,0.25 or 0.5MG /DOS, (OZEMPIC, 0.25 OR 0.5 MG/DOSE,) 2 MG/3ML SOPN Inject 0.5 mg into the skin once a week. for diabetes. 9 mL 0   No current facility-administered medications on file prior to visit.    BP (!) 142/78   Pulse 65   Temp (!) 97.4 F (36.3 C) (Temporal)   Ht 5' 5.5" (1.664 m)   Wt 186 lb (84.4 kg)   SpO2 99%   BMI 30.48 kg/m  Objective:   Physical Exam HENT:     Right Ear: Tympanic membrane  and ear canal normal.     Left Ear: Tympanic membrane and ear canal normal.     Nose: Nose normal.  Eyes:     Conjunctiva/sclera: Conjunctivae normal.     Pupils: Pupils are equal, round, and reactive to light.  Neck:     Thyroid: No thyromegaly.  Cardiovascular:     Rate and Rhythm: Normal rate and regular rhythm.     Heart sounds: No murmur heard. Pulmonary:     Effort: Pulmonary effort is normal.     Breath sounds: Normal breath sounds. No rales.  Abdominal:     General: Bowel sounds are normal.     Palpations: Abdomen is soft.     Tenderness: There is no abdominal tenderness.  Musculoskeletal:        General: Normal range of motion.     Cervical back: Neck supple.  Lymphadenopathy:     Cervical: No cervical adenopathy.  Skin:    General: Skin is warm and dry.     Findings: No rash.  Neurological:     Mental Status: She is alert and oriented to person, place, and time.     Cranial Nerves: No cranial nerve deficit.     Deep Tendon Reflexes: Reflexes are normal and symmetric.  Psychiatric:        Mood and Affect: Mood normal.           Assessment & Plan:  Preventative health care Assessment & Plan: Discussed to complete second Shingrix vaccine at the pharmacy. Declines influenza vaccine.  Mammogram and bone density scan due, orders placed. Colonoscopy overdue, she agrees to complete. Referral placed to GI  Discussed the importance of a healthy diet and regular exercise in order for weight  loss, and to reduce the risk of further co-morbidity.  Exam stable. Labs pending.  Follow up in 1 year for repeat physical.    CAD in native artery Assessment & Plan: Asymptomatic.   Repeat lipids and A1C today.    Liver cyst Assessment & Plan: Chronic, no updated imaging on file. Will obtain liver US. She agrees.   Orders: -     US ABDOMEN LIMITED RUQ (LIVER/GB); Future  Chronic bilateral low back pain, unspecified whether sciatica present Assessment & Plan: Stable.  Continue gabapentin 300 mg HS PRN. Continue Flexeril 5 mg PRN.   Chronic pain of both hips Assessment & Plan: Stable.  Continue gabapentin 300 mg HS. Continue Flexeril 5 mg PRN.    Screening mammogram for breast cancer -     3D Screening Mammogram, Left and Right; Future  Screening for colon cancer -     Ambulatory referral to Gastroenterology  Estrogen deficiency -     DG Bone Density; Future  Controlled type 2 diabetes mellitus with hyperglycemia, without long-term current use of insulin (HCC) Assessment & Plan: Repeat A1c pending. Unfortunately, Ozempic has become cost prohibitive.  She has not taken in 1 month.  Remain off of Ozempic for now. Will clarify if she is taking glipizide XL 2.5 mg daily.  Await A1c results to determine steps.  Prescription for glucometer kit sent to pharmacy.  Instructions for checking glucose levels provided today. Discussed to notify if glucose levels remain at or above 150 consistently.  Consider resuming treatment if this is the case.  Follow-up in 6 months.  Orders: -     Blood Glucose Monitoring Suppl; 1 each by Does not apply route in the morning, at noon, and at bedtime. May substitute to any manufacturer  covered by patient's insurance.  Dispense: 1 each; Refill: 0 -     Blood Glucose Test; 1 each by In Vitro route in the morning, at noon, and at bedtime. May substitute to any manufacturer covered by patient's insurance.  Dispense: 200 strip;  Refill: 0 -     Lancet Device; 1 each by Does not apply route in the morning, at noon, and at bedtime. May substitute to any manufacturer covered by patient's insurance.  Dispense: 1 each; Refill: 0 -     Lancets Misc.; 1 each by Does not apply route in the morning, at noon, and at bedtime. May substitute to any manufacturer covered by patient's insurance.  Dispense: 200 each; Refill: 0 -     Hemoglobin A1c  Multiple nevi -     Ambulatory referral to Dermatology  Hyperlipidemia, unspecified hyperlipidemia type Assessment & Plan: Repeat lipid panel pending.  Discussed the importance of a healthy diet and regular exercise in order for weight loss, and to reduce the risk of further co-morbidity. Continue rosuvastatin 10 mg daily.  Orders: -     Lipid panel  Essential hypertension, benign Assessment & Plan: Slightly above goal today, however, she has not been taking her second dose of carvedilol.  Resume carvedilol 6.25 mg twice daily. Continue amlodipine 5 mg daily.  She will start monitoring blood pressure at home and notify if readings are consistently at or above 140/90. CMP pending.  Orders: -     Comprehensive metabolic panel -     CBC        Doreene Nest, NP

## 2022-12-07 ENCOUNTER — Other Ambulatory Visit: Payer: Self-pay | Admitting: Primary Care

## 2022-12-07 DIAGNOSIS — E785 Hyperlipidemia, unspecified: Secondary | ICD-10-CM

## 2022-12-07 MED ORDER — ROSUVASTATIN CALCIUM 20 MG PO TABS
20.0000 mg | ORAL_TABLET | Freq: Every day | ORAL | 3 refills | Status: DC
Start: 2022-12-07 — End: 2023-10-16

## 2022-12-16 ENCOUNTER — Other Ambulatory Visit: Payer: Medicare HMO

## 2023-01-27 ENCOUNTER — Other Ambulatory Visit: Payer: Self-pay

## 2023-01-27 ENCOUNTER — Ambulatory Visit (INDEPENDENT_AMBULATORY_CARE_PROVIDER_SITE_OTHER): Payer: Medicare HMO | Admitting: Internal Medicine

## 2023-01-27 ENCOUNTER — Encounter: Payer: Self-pay | Admitting: Internal Medicine

## 2023-01-27 VITALS — BP 110/80 | HR 73 | Temp 98.6°F | Ht 65.5 in | Wt 187.0 lb

## 2023-01-27 DIAGNOSIS — I10 Essential (primary) hypertension: Secondary | ICD-10-CM

## 2023-01-27 DIAGNOSIS — U071 COVID-19: Secondary | ICD-10-CM | POA: Insufficient documentation

## 2023-01-27 DIAGNOSIS — E785 Hyperlipidemia, unspecified: Secondary | ICD-10-CM

## 2023-01-27 DIAGNOSIS — E1165 Type 2 diabetes mellitus with hyperglycemia: Secondary | ICD-10-CM

## 2023-01-27 MED ORDER — NIRMATRELVIR/RITONAVIR (PAXLOVID)TABLET: 3.0000 | ORAL_TABLET | Freq: Two times a day (BID) | ORAL | 0 refills | Status: DC

## 2023-01-27 MED ORDER — GLIPIZIDE ER 2.5 MG PO TB24
2.5000 mg | ORAL_TABLET | Freq: Every day | ORAL | 1 refills | Status: DC
Start: 2023-01-27 — End: 2023-12-13

## 2023-01-27 MED ORDER — AMLODIPINE BESYLATE 5 MG PO TABS
5.0000 mg | ORAL_TABLET | Freq: Every day | ORAL | 2 refills | Status: DC
Start: 2023-01-27 — End: 2023-10-16

## 2023-01-27 MED ORDER — CARVEDILOL 6.25 MG PO TABS
6.2500 mg | ORAL_TABLET | Freq: Two times a day (BID) | ORAL | 2 refills | Status: DC
Start: 2023-01-27 — End: 2024-02-01

## 2023-01-27 NOTE — Telephone Encounter (Signed)
Pt was in to see Dr Alphonsus Sias today and requested refills on amlodipine, carvedilol, glipizide, rosuvastatin. She stopped the Ozempic due to cost. Wants to go back on what she was taking prior to that (Jardiance she thinks)

## 2023-01-27 NOTE — Telephone Encounter (Signed)
Notify patient that she was taking Glipizide XL 2.5 mg daily for diabetes in place of Ozempic. She's not taken Januvia in >1 year.   If Ozempic is too expensive then we will continue Glipizide.  Will refill amlodipine and Glipizide.  She has plenty of refills on file at the pharmacy for rosuvastatin.   Refill(s) sent to pharmacy.

## 2023-01-27 NOTE — Assessment & Plan Note (Signed)
Some systemic symptoms Is interested in paxlovid and that is appropriate given her age/risk Discussed tylenol, and cough meds If worsens, call rescue for ER evaluation Out of work till next week--then wear mask

## 2023-01-27 NOTE — Progress Notes (Signed)
Subjective:    Patient ID: Brenda Flores, female    DOB: May 27, 1945, 77 y.o.   MRN: [id number]  HPI Here due to COVID infection  Started feeling bad 2 days ago Crick in neck--then sore all over Sore throat, headache and persistent headache Worse yesterday---so took COVID test and positive (several positive at [facility])  No fever Does have chills and sweats No SOB---just slight heaviness in chest and some wheezing  Current Outpatient Medications on File Prior to Visit  Medication Sig Dispense Refill   albuterol (PROVENTIL) (2.5 MG/3ML) 0.083% nebulizer solution Take 3 mLs (2.5 mg total) by nebulization every 6 (six) hours as needed for wheezing or shortness of breath. 150 mL 1   amLODipine (NORVASC) 5 MG tablet TAKE 1 TABLET BY MOUTH EVERY DAY FOR BLOOD PRESSURE 90 tablet 1   Blood Glucose Monitoring Suppl DEVI 1 each by Does not apply route in the morning, at noon, and at bedtime. May substitute to any manufacturer covered by patient's insurance. 1 each 0   carvedilol (COREG) 6.25 MG tablet TAKE 1 TABLET (6.25 MG TOTAL) BY MOUTH 2 (TWO) TIMES DAILY WITH A MEAL. FOR BLOOD PRESSURE. 180 tablet 1   cyclobenzaprine (FLEXERIL) 5 MG tablet TAKE 1 TABLET BY MOUTH AT BEDTIME AS NEEDED FOR MUSCLE SPASMS. 30 tablet 0   gabapentin (NEURONTIN) 300 MG capsule TAKE 1 CAPSULE (300 MG TOTAL) BY MOUTH AT BEDTIME. FOR PAIN 90 capsule 1   glipiZIDE (GLUCOTROL XL) 2.5 MG 24 hr tablet TAKE 1 TABLET (2.5 MG TOTAL) BY MOUTH DAILY WITH BREAKFAST. FOR DIABETES. 90 tablet 1   Glucose Blood (BLOOD GLUCOSE TEST STRIPS) STRP 1 each by In Vitro route in the morning, at noon, and at bedtime. May substitute to any manufacturer covered by patient's insurance. 200 strip 0   Lancets Misc. MISC 1 each by Does not apply route in the morning, at noon, and at bedtime. May substitute to any manufacturer covered by patient's insurance. 200 each 0   rosuvastatin (CRESTOR) 20 MG tablet Take 1 tablet (20 mg total) by  mouth daily. for cholesterol. 90 tablet 3   Semaglutide,0.25 or 0.5MG /DOS, (OZEMPIC, 0.25 OR 0.5 MG/DOSE,) 2 MG/3ML SOPN Inject 0.5 mg into the skin once a week. for diabetes. 9 mL 0   No current facility-administered medications on file prior to visit.    Allergies  Allergen Reactions   Darvocet [Propoxyphene N-Acetaminophen] Nausea And Vomiting   Propoxyphene Other (See Comments)   Tetanus Toxoids     TDAP hives, pt reports she had TD in the past w/ no issues    Past Medical History:  Diagnosis Date   Achilles rupture, right    Acute foot pain, left 09/26/2018   Bronchitis    CAD (coronary artery disease) Dr Excell Seltzer   non-obs CAD by cath in 7/07 (mLAD 30-40%, EF 65%)   COVID-19 03/23/2022   DDD (degenerative disc disease), lumbar    DJD (degenerative joint disease)    KNEES   GERD (gastroesophageal reflux disease)    History of mitral valve prolapse    PER PT REPORTED MVP  PER DR HARSHAW FROM CARDIAC CATH IN 1987 (APPROX)   History of rectal polyps    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   Hyperlipidemia    Hypertension    Left nephrolithiasis    NON-OBSTRUCTIVE   Liver, polycystic    Lumbar radiculopathy    Osteoporosis    PONV (postoperative nausea and vomiting)    Right  ureteral stone    Shoulder pain 05/30/2018   Type 2 diabetes mellitus [facility])     Past Surgical History:  Procedure Laterality Date   ACHILLES TENDON SURGERY Right 05/26/2016   Procedure: RIGHT ACHILLES TENDON REPAIR,CALCANEUS SPUR EXCISION;  Surgeon: Loreta Ave, MD;  Location: [facility];  Service: Orthopedics;  Laterality: Right;   CARDIAC CATHETERIZATION  10-17-2005   DR COOPER   NON-OBSTRUCTIVE CAD/   mLAD 30-40%/   NORMAL LVF/  EF 65%   CARDIOVASCULAR STRESS TEST  04-12-2011  DR Sharlot Gowda TAYLOR   LOW RISK STUDY/  NO ISCHEMIA/  EF 68%   CATARACT EXTRACTION W/ INTRAOCULAR LENS IMPLANT Right 2003   COLONOSCOPY W/ POLYPECTOMY  03-17-2011   CYSTOSCOPY WITH  URETEROSCOPY Right 06/14/2013   Procedure: RIGHT  URETEROSCOPY,ureteral and urethral dilitation;  Surgeon: Garnett Farm, MD;  Location: [facility];  Service: Urology;  Laterality: Right;   HEEL SPUR RESECTION Right 05/26/2016   Procedure: HEEL SPUR EXCISION;  Surgeon: Loreta Ave, MD;  Location: [facility];  Service: Orthopedics;  Laterality: Right;   HEEL SPUR SURGERY Left 2003   TOTAL ABDOMINAL HYSTERECTOMY W/ BILATERAL SALPINGOOPHORECTOMY  1978   TRANSTHORACIC ECHOCARDIOGRAM  04-07-2011   MILD LVH/  MILD FOCAL BASAL SEPTAL HYPERTROPHY/  EF  55-60% /  GRADE I DIASTOLIC DYSFUNCTION    Family History  Problem Relation Age of Onset   Cancer Mother        breast   Heart disease Mother    Cancer Father        colon   Cancer Brother        ?lung   Heart disease Brother    Heart disease Maternal Aunt    Breast cancer Neg Hx     Social History   Socioeconomic History   Marital status: Widowed    Spouse name: Not on file   Number of children: 2   Years of education: Not on file   Highest education level: Not on file  Occupational History   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  Tobacco Use   Smoking status: Never   Smokeless tobacco: Never  Vaping Use   Vaping status: Never Used  Substance and Sexual Activity   Alcohol use: No    Alcohol/week: 0.0 standard drinks of alcohol   Drug use: No   Sexual activity: Not on file  Other Topics Concern   Not on file  Social History Narrative   Not on file   Social Determinants of Health   Financial Resource Strain: Not on file  Food Insecurity: Not on file  Transportation Needs: Not on file  Physical Activity: Not on file  Stress: Not on file  Social Connections: Not on file  Intimate Partner Violence: Not on file   Review of Systems No change in smell or taste No N/V Able to eat Has kept up with vaccines for COVID    Objective:   Physical Exam Constitutional:      General: She is not in acute  distress.    Appearance: Normal appearance.  HENT:     Head:     Comments: Mild frontal tenderness    Right Ear: Tympanic membrane and ear canal normal.     Left Ear: Tympanic membrane and ear canal normal.     Mouth/Throat:     Pharynx: No oropharyngeal exudate or posterior oropharyngeal erythema.  Pulmonary:     Effort: Pulmonary effort is normal.  Breath sounds: Normal breath sounds. No wheezing or rales.  Musculoskeletal:     Cervical back: Neck supple.  Lymphadenopathy:     Cervical: No cervical adenopathy.  Neurological:     Mental Status: She is alert.            Assessment & Plan:

## 2023-01-30 NOTE — Telephone Encounter (Signed)
Unable to reach patient. Left voicemail to return call to our office.   

## 2023-01-31 ENCOUNTER — Telehealth: Payer: Self-pay | Admitting: Primary Care

## 2023-01-31 MED ORDER — GUAIFENESIN-CODEINE 100-10 MG/5ML PO SOLN: 5.0000 mL | Freq: Three times a day (TID) | ORAL | 0 refills | Status: DC | PRN

## 2023-01-31 NOTE — Telephone Encounter (Signed)
Called patient and reviewed all information. Patient verbalized understanding. Will call if any further questions.  

## 2023-01-31 NOTE — Telephone Encounter (Signed)
Spoke to pt

## 2023-01-31 NOTE — Telephone Encounter (Signed)
Patient was seen last week and tested positive for covid. She called in today stating that she has a really bad sore throat and a horrible cough that she is wondering if she could have cough syrup called in for.She said that the cough syrup with codeine in it has been called in for her in the past and it really worked.  [id number] Sheen, [place] - [address] Phone: [phone]  Fax: [phone]

## 2023-02-09 ENCOUNTER — Telehealth: Payer: Self-pay | Admitting: Primary Care

## 2023-02-09 NOTE — Telephone Encounter (Signed)
Patient needs re-evaluation for ongoing cough, especially if she's not feeling better. Can we get her scheduled?

## 2023-02-09 NOTE — Telephone Encounter (Signed)
Patient would like to know if theres anything else that she could be prescribed for her ongoing cough that she has had since she had had covid.She said that the cough is in her chest. She said that she had already taken the cough syrup with codeine in that was prescribed by [facility],but she still has the really bad cough.  [id number]en, [place] - [address] Phone: [phone]  Fax: [phone]

## 2023-02-10 NOTE — Telephone Encounter (Signed)
LVM for patient to call back and schedule

## 2023-02-13 ENCOUNTER — Encounter: Payer: Self-pay | Admitting: Primary Care

## 2023-02-13 ENCOUNTER — Ambulatory Visit (INDEPENDENT_AMBULATORY_CARE_PROVIDER_SITE_OTHER): Payer: Medicare HMO | Admitting: Primary Care

## 2023-02-13 VITALS — BP 180/86 | HR 65 | Temp 97.7°F | Ht 65.5 in | Wt 185.0 lb

## 2023-02-13 DIAGNOSIS — R053 Chronic cough: Secondary | ICD-10-CM | POA: Diagnosis not present

## 2023-02-13 DIAGNOSIS — E119 Type 2 diabetes mellitus without complications: Secondary | ICD-10-CM | POA: Diagnosis not present

## 2023-02-13 DIAGNOSIS — I509 Heart failure, unspecified: Secondary | ICD-10-CM | POA: Diagnosis not present

## 2023-02-13 MED ORDER — ALBUTEROL SULFATE HFA 108 (90 BASE) MCG/ACT IN AERS: 2.0000 | INHALATION_SPRAY | RESPIRATORY_TRACT | 0 refills | Status: AC | PRN

## 2023-02-13 MED ORDER — OMEPRAZOLE 20 MG PO CPDR: 20.0000 mg | DELAYED_RELEASE_CAPSULE | Freq: Every day | ORAL | 0 refills | Status: DC

## 2023-02-13 MED ORDER — PREDNISONE 20 MG PO TABS
ORAL_TABLET | ORAL | 0 refills | Status: DC
Start: 2023-02-13 — End: 2023-07-14

## 2023-02-13 NOTE — Progress Notes (Signed)
Subjective:    Patient ID: Brenda Flores, female    DOB: 11/09/1945, 77 y.o.   MRN: [id number]  Cough Associated symptoms include shortness of breath. Pertinent negatives include no chest pain, chills, fever, headaches, postnasal drip, sore throat or wheezing.    Brenda Flores is a very pleasant 77 y.o. female with a history of hypertension, CHF, CAD, type 2 diabetes, hyperlipidemia, exertional dyspnea, COVID-19 infection who presents today for persistent cough.  Evaluated by Dr. Alphonsus Sias on 01/27/2023 for COVID-19 infection.  Symptom onset 2 days prior with body aches, sore throat, headaches, chills, chest heaviness.  During this visit she was prescribed Paxlovid antiviral treatment and advised to rest.  She called our office on 01/31/2023 requesting cough medication with codeine due to ongoing cough.  She called our office on 02/09/2023 reporting continued cough despite cough medication with codeine so she was asked to come in for an office visit.  Today she's feeling better except for a lingering cough with mild shortness of breath, chest tightness, and fatigue. She denies chills, body aches, fevers, rhinorrhea, esophageal burning, history of tobacco use. She believes she was diagnosed with asthmatic bronchitis years ago. She does not have an albuterol inhaler.  She's taken Mucinex Day and Night, Robitussin, and cough medication with codeine without improvement in cough. She doesn't feel sick, she's just tired of coughing.   Review of Systems  Constitutional:  Positive for fatigue. Negative for chills and fever.  HENT:  Negative for congestion, postnasal drip and sore throat.   Respiratory:  Positive for cough, chest tightness and shortness of breath. Negative for wheezing.   Cardiovascular:  Negative for chest pain.  Neurological:  Negative for headaches.         Past Medical History:  Diagnosis Date   Achilles rupture, right    Acute foot pain, left 09/26/2018    Bronchitis    CAD (coronary artery disease) Dr Excell Seltzer   non-obs CAD by cath in 7/07 (mLAD 30-40%, EF 65%)   COVID-19 03/23/2022   DDD (degenerative disc disease), lumbar    DJD (degenerative joint disease)    KNEES   GERD (gastroesophageal reflux disease)    History of mitral valve prolapse    PER PT REPORTED MVP  PER DR HARSHAW FROM CARDIAC CATH IN 1987 (APPROX)   History of rectal polyps    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   Hyperlipidemia    Hypertension    Left nephrolithiasis    NON-OBSTRUCTIVE   Liver, polycystic    Lumbar radiculopathy    Osteoporosis    PONV (postoperative nausea and vomiting)    Right ureteral stone    Shoulder pain 05/30/2018   Type 2 diabetes mellitus (HCC)     Social History   Socioeconomic History   Marital status: Widowed    Spouse name: Not on file   Number of children: 2   Years of education: Not on file   Highest education level: Not on file  Occupational History   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  Tobacco Use   Smoking status: Never   Smokeless tobacco: Never  Vaping Use   Vaping status: Never Used  Substance and Sexual Activity   Alcohol use: No    Alcohol/week: 0.0 standard drinks of alcohol   Drug use: No   Sexual activity: Not on file  Other Topics Concern   Not on file  Social History Narrative   Not on file   Social Determinants of Health  Financial Resource Strain: Not on file  Food Insecurity: Not on file  Transportation Needs: Not on file  Physical Activity: Not on file  Stress: Not on file  Social Connections: Not on file  Intimate Partner Violence: Not on file    Past Surgical History:  Procedure Laterality Date   ACHILLES TENDON SURGERY Right 05/26/2016   Procedure: RIGHT ACHILLES TENDON REPAIR,CALCANEUS SPUR EXCISION;  Surgeon: Loreta Ave, MD;  Location: [facility];  Service: Orthopedics;  Laterality: Right;   CARDIAC CATHETERIZATION  10-17-2005   DR COOPER    NON-OBSTRUCTIVE CAD/   mLAD 30-40%/   NORMAL LVF/  EF 65%   CARDIOVASCULAR STRESS TEST  04-12-2011  DR Sharlot Gowda TAYLOR   LOW RISK STUDY/  NO ISCHEMIA/  EF 68%   CATARACT EXTRACTION W/ INTRAOCULAR LENS IMPLANT Right 2003   COLONOSCOPY W/ POLYPECTOMY  03-17-2011   CYSTOSCOPY WITH URETEROSCOPY Right 06/14/2013   Procedure: RIGHT  URETEROSCOPY,ureteral and urethral dilitation;  Surgeon: Garnett Farm, MD;  Location: [facility];  Service: Urology;  Laterality: Right;   HEEL SPUR RESECTION Right 05/26/2016   Procedure: HEEL SPUR EXCISION;  Surgeon: Loreta Ave, MD;  Location: [facility];  Service: Orthopedics;  Laterality: Right;   HEEL SPUR SURGERY Left 2003   TOTAL ABDOMINAL HYSTERECTOMY W/ BILATERAL SALPINGOOPHORECTOMY  1978   TRANSTHORACIC ECHOCARDIOGRAM  04-07-2011   MILD LVH/  MILD FOCAL BASAL SEPTAL HYPERTROPHY/  EF  55-60% /  GRADE I DIASTOLIC DYSFUNCTION    Family History  Problem Relation Age of Onset   Cancer Mother        breast   Heart disease Mother    Cancer Father        colon   Cancer Brother        ?lung   Heart disease Brother    Heart disease Maternal Aunt    Breast cancer Neg Hx     Allergies  Allergen Reactions   Darvocet [Propoxyphene N-Acetaminophen] Nausea And Vomiting   Propoxyphene Other (See Comments)   Tetanus Toxoids     TDAP hives, pt reports she had TD in the past w/ no issues    Current Outpatient Medications on File Prior to Visit  Medication Sig Dispense Refill   albuterol (PROVENTIL) (2.5 MG/3ML) 0.083% nebulizer solution Take 3 mLs (2.5 mg total) by nebulization every 6 (six) hours as needed for wheezing or shortness of breath. 150 mL 1   amLODipine (NORVASC) 5 MG tablet Take 1 tablet (5 mg total) by mouth daily. for blood pressure. 90 tablet 2   Blood Glucose Monitoring Suppl DEVI 1 each by Does not apply route in the morning, at noon, and at bedtime. May substitute to any manufacturer covered by patient's  insurance. 1 each 0   carvedilol (COREG) 6.25 MG tablet Take 1 tablet (6.25 mg total) by mouth 2 (two) times daily with a meal. for blood pressure. 180 tablet 2   cyclobenzaprine (FLEXERIL) 5 MG tablet TAKE 1 TABLET BY MOUTH AT BEDTIME AS NEEDED FOR MUSCLE SPASMS. 30 tablet 0   gabapentin (NEURONTIN) 300 MG capsule TAKE 1 CAPSULE (300 MG TOTAL) BY MOUTH AT BEDTIME. FOR PAIN 90 capsule 1   glipiZIDE (GLUCOTROL XL) 2.5 MG 24 hr tablet Take 1 tablet (2.5 mg total) by mouth daily with breakfast. for diabetes. 90 tablet 1   Glucose Blood (BLOOD GLUCOSE TEST STRIPS) STRP 1 each by In Vitro route in the morning, at noon, and at bedtime.  May substitute to any manufacturer covered by patient's insurance. 200 strip 0   guaiFENesin-codeine 100-10 MG/5ML syrup Take 5 mLs by mouth 3 (three) times daily as needed for cough. 120 mL 0   Lancets Misc. MISC 1 each by Does not apply route in the morning, at noon, and at bedtime. May substitute to any manufacturer covered by patient's insurance. 200 each 0   rosuvastatin (CRESTOR) 20 MG tablet Take 1 tablet (20 mg total) by mouth daily. for cholesterol. 90 tablet 3   No current facility-administered medications on file prior to visit.    BP (!) 180/86   Pulse 65   Temp 97.7 F (36.5 C) (Temporal)   Ht 5' 5.5" (1.664 m)   Wt 185 lb (83.9 kg)   SpO2 99%   BMI 30.32 kg/m  Objective:   Physical Exam Constitutional:      Appearance: She is not ill-appearing.  HENT:     Right Ear: Tympanic membrane and ear canal normal.     Left Ear: Tympanic membrane and ear canal normal.     Nose: No mucosal edema.     Right Sinus: No maxillary sinus tenderness or frontal sinus tenderness.     Left Sinus: No maxillary sinus tenderness or frontal sinus tenderness.     Mouth/Throat:     Mouth: Mucous membranes are moist.  Eyes:     Conjunctiva/sclera: Conjunctivae normal.  Cardiovascular:     Rate and Rhythm: Normal rate and regular rhythm.  Pulmonary:     Effort:  Pulmonary effort is normal.     Breath sounds: Normal breath sounds. No wheezing or rhonchi.     Comments: Persistent dry bronchospasm type cough during exam Musculoskeletal:     Cervical back: Neck supple.  Skin:    General: Skin is warm and dry.           Assessment & Plan:  Persistent cough Assessment & Plan: Postviral in the setting of recent COVID-19 infection.  Respiratory exam and vital signs today without evidence of pneumonia. Given that she feels well overall except for the cough suggests postviral cough syndrome. Unclear whether she had asthma in the past, but she did do well with an albuterol inhaler.  Start prednisone 20 mg tablets. Take 2 tablets by mouth once daily in the morning for 5 days. Start albuterol inhaler every 4-6 hours as needed for shortness of breath/cough. Start omeprazole 20 mg daily for potential reflux induced cough  She will update.  Orders: -     predniSONE; Take 2 tablets by mouth once daily for 5 days.  Dispense: 10 tablet; Refill: 0 -     Albuterol Sulfate HFA; Inhale 2 puffs into the lungs every 4 (four) hours as needed for shortness of breath.  Dispense: 1 each; Refill: 0 -     Omeprazole; Take 1 capsule (20 mg total) by mouth daily. For cough  Dispense: 30 capsule; Refill: 0        Doreene Nest, NP

## 2023-02-13 NOTE — Assessment & Plan Note (Signed)
Postviral in the setting of recent COVID-19 infection.  Respiratory exam and vital signs today without evidence of pneumonia. Given that she feels well overall except for the cough suggests postviral cough syndrome. Unclear whether she had asthma in the past, but she did do well with an albuterol inhaler.  Start prednisone 20 mg tablets. Take 2 tablets by mouth once daily in the morning for 5 days. Start albuterol inhaler every 4-6 hours as needed for shortness of breath/cough. Start omeprazole 20 mg daily for potential reflux induced cough  She will update.

## 2023-02-13 NOTE — Patient Instructions (Signed)
Start prednisone 20 mg tablets. Take 2 tablets by mouth once daily in the morning for 5 days.  Shortness of Breath/Wheezing/Cough: Use the albuterol inhaler. Inhale 2 puffs into the lungs every 4 to 6 hours as needed for wheezing, cough, and/or shortness of breath.   Start omeprazole 20 mg daily for potential silent heartburn induced cough.  Please update [place] in a few days.  It was a pleasure to see you today!

## 2023-02-21 ENCOUNTER — Ambulatory Visit (INDEPENDENT_AMBULATORY_CARE_PROVIDER_SITE_OTHER)
Admission: RE | Admit: 2023-02-21 | Discharge: 2023-02-21 | Disposition: A | Discharge status: Home / Self Care | Payer: Medicare HMO | Source: Ambulatory Visit | Attending: Primary Care | Admitting: Primary Care

## 2023-02-21 ENCOUNTER — Ambulatory Visit (INDEPENDENT_AMBULATORY_CARE_PROVIDER_SITE_OTHER): Payer: Medicare HMO | Admitting: Primary Care

## 2023-02-21 ENCOUNTER — Encounter: Payer: Self-pay | Admitting: Primary Care

## 2023-02-21 VITALS — BP 142/86 | HR 64 | Temp 97.8°F | Ht 65.5 in | Wt 184.0 lb

## 2023-02-21 DIAGNOSIS — R053 Chronic cough: Secondary | ICD-10-CM

## 2023-02-21 DIAGNOSIS — R918 Other nonspecific abnormal finding of lung field: Secondary | ICD-10-CM | POA: Diagnosis not present

## 2023-02-21 DIAGNOSIS — I700 Atherosclerosis of aorta: Secondary | ICD-10-CM | POA: Diagnosis not present

## 2023-02-21 DIAGNOSIS — Z8616 Personal history of COVID-19: Secondary | ICD-10-CM | POA: Diagnosis not present

## 2023-02-21 MED ORDER — AMOXICILLIN-POT CLAVULANATE 875-125 MG PO TABS: 1.0000 | ORAL_TABLET | Freq: Two times a day (BID) | ORAL | 0 refills | Status: DC

## 2023-02-21 NOTE — Progress Notes (Addendum)
Subjective:    Patient ID: Brenda Flores, female    DOB: 23-Jan-1946, 77 y.o.   MRN: [id number]  HPI  Brenda Flores is a very pleasant 77 y.o. female with a history of hypertension, CAD, CHF, type 2 diabetes, hyperlipidemia, exertional dyspnea, decreased renal function, COVID-19 infection, persistent cough who presents today for follow-up of persistent cough.  She was last evaluated on 02/13/2023 for persistent cough after a COVID-19 infection previously.  During this visit she felt well except for a lingering cough with mild shortness of breath and chest tightness.  She was treated with prednisone 40 mg daily x 5 days.  Since her last visit her cough has persisted.  She has been compliant to prednisone and omeprazole 20 mg which has helped some, but still continues to cough.  Four days ago she began noticing bilateral ear pain which has improved some.   She continues to notice chest tightness and fatigue. She cannot sleep at night. Her cough is worse at night.  She denies fevers, chills, lower extremity edema.    Review of Systems  Constitutional:  Positive for fatigue. Negative for chills and fever.  HENT:  Negative for congestion.   Respiratory:  Positive for cough and chest tightness. Negative for shortness of breath.          Past Medical History:  Diagnosis Date   Achilles rupture, right    Acute foot pain, left 09/26/2018   Bronchitis    CAD (coronary artery disease) Dr Excell Seltzer   non-obs CAD by cath in 7/07 (mLAD 30-40%, EF 65%)   COVID-19 03/23/2022   DDD (degenerative disc disease), lumbar    DJD (degenerative joint disease)    KNEES   GERD (gastroesophageal reflux disease)    History of mitral valve prolapse    PER PT REPORTED MVP  PER DR HARSHAW FROM CARDIAC CATH IN 1987 (APPROX)   History of rectal polyps    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   Hyperlipidemia    Hypertension    Left nephrolithiasis    NON-OBSTRUCTIVE    Liver, polycystic    Lumbar radiculopathy    Osteoporosis    PONV (postoperative nausea and vomiting)    Right ureteral stone    Shoulder pain 05/30/2018   Type 2 diabetes mellitus (HCC)     Social History   Socioeconomic History   Marital status: Widowed    Spouse name: Not on file   Number of children: 2   Years of education: Not on file   Highest education level: Not on file  Occupational History   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  Tobacco Use   Smoking status: Never   Smokeless tobacco: Never  Vaping Use   Vaping status: Never Used  Substance and Sexual Activity   Alcohol use: No    Alcohol/week: 0.0 standard drinks of alcohol   Drug use: No   Sexual activity: Not on file  Other Topics Concern   Not on file  Social History Narrative   Not on file   Social Determinants of Health   Financial Resource Strain: Not on file  Food Insecurity: Not on file  Transportation Needs: Not on file  Physical Activity: Not on file  Stress: Not on file  Social Connections: Not on file  Intimate Partner Violence: Not on file    Past Surgical History:  Procedure Laterality Date   ACHILLES TENDON SURGERY Right 05/26/2016   Procedure: RIGHT ACHILLES TENDON REPAIR,CALCANEUS SPUR EXCISION;  Surgeon: Loreta Ave, MD;  Location: [facility];  Service: Orthopedics;  Laterality: Right;   CARDIAC CATHETERIZATION  10-17-2005   DR COOPER   NON-OBSTRUCTIVE CAD/   mLAD 30-40%/   NORMAL LVF/  EF 65%   CARDIOVASCULAR STRESS TEST  04-12-2011  DR Sharlot Gowda TAYLOR   LOW RISK STUDY/  NO ISCHEMIA/  EF 68%   CATARACT EXTRACTION W/ INTRAOCULAR LENS IMPLANT Right 2003   COLONOSCOPY W/ POLYPECTOMY  03-17-2011   CYSTOSCOPY WITH URETEROSCOPY Right 06/14/2013   Procedure: RIGHT  URETEROSCOPY,ureteral and urethral dilitation;  Surgeon: Garnett Farm, MD;  Location: [facility];  Service: Urology;  Laterality: Right;   HEEL SPUR RESECTION Right 05/26/2016   Procedure: HEEL SPUR EXCISION;   Surgeon: Loreta Ave, MD;  Location: [facility];  Service: Orthopedics;  Laterality: Right;   HEEL SPUR SURGERY Left 2003   TOTAL ABDOMINAL HYSTERECTOMY W/ BILATERAL SALPINGOOPHORECTOMY  1978   TRANSTHORACIC ECHOCARDIOGRAM  04-07-2011   MILD LVH/  MILD FOCAL BASAL SEPTAL HYPERTROPHY/  EF  55-60% /  GRADE I DIASTOLIC DYSFUNCTION    Family History  Problem Relation Age of Onset   Cancer Mother        breast   Heart disease Mother    Cancer Father        colon   Cancer Brother        ?lung   Heart disease Brother    Heart disease Maternal Aunt    Breast cancer Neg Hx     Allergies  Allergen Reactions   Darvocet [Propoxyphene N-Acetaminophen] Nausea And Vomiting   Propoxyphene Other (See Comments)   Tetanus Toxoids     TDAP hives, pt reports she had TD in the past w/ no issues    Current Outpatient Medications on File Prior to Visit  Medication Sig Dispense Refill   albuterol (PROVENTIL) (2.5 MG/3ML) 0.083% nebulizer solution Take 3 mLs (2.5 mg total) by nebulization every 6 (six) hours as needed for wheezing or shortness of breath. 150 mL 1   albuterol (VENTOLIN HFA) 108 (90 Base) MCG/ACT inhaler Inhale 2 puffs into the lungs every 4 (four) hours as needed for shortness of breath. 1 each 0   amLODipine (NORVASC) 5 MG tablet Take 1 tablet (5 mg total) by mouth daily. for blood pressure. 90 tablet 2   Blood Glucose Monitoring Suppl DEVI 1 each by Does not apply route in the morning, at noon, and at bedtime. May substitute to any manufacturer covered by patient's insurance. 1 each 0   carvedilol (COREG) 6.25 MG tablet Take 1 tablet (6.25 mg total) by mouth 2 (two) times daily with a meal. for blood pressure. 180 tablet 2   cyclobenzaprine (FLEXERIL) 5 MG tablet TAKE 1 TABLET BY MOUTH AT BEDTIME AS NEEDED FOR MUSCLE SPASMS. 30 tablet 0   gabapentin (NEURONTIN) 300 MG capsule TAKE 1 CAPSULE (300 MG TOTAL) BY MOUTH AT BEDTIME. FOR PAIN 90 capsule 1   glipiZIDE  (GLUCOTROL XL) 2.5 MG 24 hr tablet Take 1 tablet (2.5 mg total) by mouth daily with breakfast. for diabetes. 90 tablet 1   Glucose Blood (BLOOD GLUCOSE TEST STRIPS) STRP 1 each by In Vitro route in the morning, at noon, and at bedtime. May substitute to any manufacturer covered by patient's insurance. 200 strip 0   Lancets Misc. MISC 1 each by Does not apply route in the morning, at noon, and at bedtime. May substitute to any manufacturer covered by patient's  insurance. 200 each 0   omeprazole (PRILOSEC) 20 MG capsule Take 1 capsule (20 mg total) by mouth daily. For cough 30 capsule 0   rosuvastatin (CRESTOR) 20 MG tablet Take 1 tablet (20 mg total) by mouth daily. for cholesterol. 90 tablet 3   guaiFENesin-codeine 100-10 MG/5ML syrup Take 5 mLs by mouth 3 (three) times daily as needed for cough. (Patient not taking: Reported on 02/21/2023) 120 mL 0   predniSONE (DELTASONE) 20 MG tablet Take 2 tablets by mouth once daily for 5 days. (Patient not taking: Reported on 02/21/2023) 10 tablet 0   No current facility-administered medications on file prior to visit.    BP (!) 142/86   Pulse 64   Temp 97.8 F (36.6 C) (Temporal)   Ht 5' 5.5" (1.664 m)   Wt 184 lb (83.5 kg)   SpO2 99%   BMI 30.15 kg/m  Objective:   Physical Exam Constitutional:      Appearance: She is ill-appearing.  HENT:     Right Ear: Tympanic membrane and ear canal normal.     Left Ear: Tympanic membrane and ear canal normal.     Nose: No mucosal edema.     Right Sinus: No maxillary sinus tenderness or frontal sinus tenderness.     Left Sinus: No maxillary sinus tenderness or frontal sinus tenderness.     Mouth/Throat:     Mouth: Mucous membranes are moist.  Eyes:     Conjunctiva/sclera: Conjunctivae normal.  Cardiovascular:     Rate and Rhythm: Normal rate and regular rhythm.  Pulmonary:     Effort: Pulmonary effort is normal.     Breath sounds: Normal breath sounds. No wheezing or rhonchi.     Comments: Persistent,  dry cough noted during exam Musculoskeletal:     Cervical back: Neck supple.  Skin:    General: Skin is warm and dry.           Assessment & Plan:  Persistent cough Assessment & Plan: Little improvement.  Need to evaluate for pneumonia.  Chest x-ray ordered and pending.  Complete prednisone 40 mg daily. Increase omeprazole to 40 mg at bedtime.  Start Augmentin antibiotics. Take 1 tablet by mouth twice daily for 7 days.  She will update in a few days.  Orders: -     Amoxicillin-Pot Clavulanate; Take 1 tablet by mouth 2 (two) times daily.  Dispense: 14 tablet; Refill: 0 -     DG Chest 2 View        Doreene Nest, NP

## 2023-02-21 NOTE — Patient Instructions (Signed)
Start Augmentin antibiotics. Take 1 tablet by mouth twice daily for 7 days.  Complete xray(s) prior to leaving today. I will notify you of your results once received.  Please call me Friday this week with an update.  It was a pleasure to see you today!

## 2023-02-21 NOTE — Assessment & Plan Note (Signed)
Little improvement.  Need to evaluate for pneumonia.  Chest x-ray ordered and pending.  Complete prednisone 40 mg daily. Increase omeprazole to 40 mg at bedtime.  Start Augmentin antibiotics. Take 1 tablet by mouth twice daily for 7 days.  She will update in a few days.

## 2023-02-22 ENCOUNTER — Other Ambulatory Visit: Payer: Self-pay | Admitting: Primary Care

## 2023-02-22 DIAGNOSIS — R053 Chronic cough: Secondary | ICD-10-CM

## 2023-02-22 MED ORDER — GUAIFENESIN-CODEINE 100-10 MG/5ML PO SOLN
5.0000 mL | Freq: Three times a day (TID) | ORAL | 0 refills | Status: DC | PRN
Start: 2023-02-22 — End: 2023-07-14

## 2023-02-24 ENCOUNTER — Telehealth: Payer: Self-pay | Admitting: Primary Care

## 2023-02-24 NOTE — Telephone Encounter (Signed)
Please thank her for the update. Glad to know that she is feeling some better.  Continue the antibiotic medication as prescribed. Continue taking omeprazole 40 mg (2 pills every day). Have her call us back early next week if symptoms have not continued to improve.  She can try taking vitamin C and zinc to see if this helps with immunity.

## 2023-02-24 NOTE — Telephone Encounter (Signed)
Pt notified of Kate's comments/ instructions and verbalized understanding she will update [place] next week

## 2023-02-24 NOTE — Telephone Encounter (Signed)
Patient called to give an update on symptoms. Says today her cough and SOB is improving, but still there. Says she was 9/10 pain yesterday and is an 8/10 today. No chest pain, some tightness in her chest. Patient says she is very low energy. Would also like to now what she can do to get her immune sytem built back up, please advise.

## 2023-03-11 ENCOUNTER — Other Ambulatory Visit: Payer: Self-pay | Admitting: Primary Care

## 2023-03-11 DIAGNOSIS — R053 Chronic cough: Secondary | ICD-10-CM

## 2023-03-16 ENCOUNTER — Other Ambulatory Visit: Payer: Self-pay | Admitting: Primary Care

## 2023-03-16 DIAGNOSIS — R053 Chronic cough: Secondary | ICD-10-CM

## 2023-05-02 ENCOUNTER — Ambulatory Visit: Payer: Self-pay | Admitting: Primary Care

## 2023-05-02 NOTE — Telephone Encounter (Signed)
Copied from CRM [id number]. Topic: Clinical - Red Word Triage >> May 02, 2023 11:49 AM Gurney Maxin H wrote: [facility] that prompted transfer to Nurse Triage: Trouble with left arm, pain from elbow to shoulder, hurts to reach or move it.   Chief Complaint: Arm pain Symptoms: Left arm pain, chest pain  Frequency: Constant  Pertinent Negatives: Patient denies numbness or tingling  Disposition: [x] ED /[] Urgent Care (no appt availability in office) / [] Appointment(In office/virtual)/ []  [facility]/ [] Home Care/ [] Refused Recommended Disposition /[] [facility]/ []  Follow-up with PCP Additional Notes: Patient reports that for the last 2 weeks she has had left arm pain with left sided chest pain. She states her pain is exacerbated with movement of her arm. Patient denies any numbness or tingling. Patient advised that due to her chest pain with the arm pain that she should be evaluated in the ED to rule out any cardiac related causes. Patient verbalized understanding of this plan.      Reason for Disposition  [1] Age > 40 AND [2] associated chest or jaw pain AND [3] pain lasts > 5 minutes  Answer Assessment - Initial Assessment Questions 1. ONSET: "When did the pain start?"     2 weeks  2. LOCATION: "Where is the pain located?"     Left arm from shoulder to elbow 3. PAIN: "How bad is the pain?" (Scale 1-10; or mild, moderate, severe)   - MILD (1-3): Doesn't interfere with normal activities.   - MODERATE (4-7): Interferes with normal activities (e.g., work or school) or awakens from sleep.   - SEVERE (8-10): Excruciating pain, unable to do any normal activities, unable to hold a cup of water.     9/10 4. WORK OR EXERCISE: "Has there been any recent work or exercise that involved this part of the body?"     Does lifting at work  5. CAUSE: "What do you think is causing the arm pain?"     Unsure   6. OTHER SYMPTOMS: "Do you have any other symptoms?" (e.g., neck pain, swelling,  rash, fever, numbness, weakness)     Chest pain left sided 7. PREGNANCY: "Is there any chance you are pregnant?" "When was your last menstrual period?"     No  Protocols used: Arm Pain-A-AH

## 2023-05-02 NOTE — Telephone Encounter (Signed)
Noted. Will wait ED notes.

## 2023-06-06 ENCOUNTER — Ambulatory Visit: Payer: Medicare PPO | Admitting: Primary Care

## 2023-07-12 ENCOUNTER — Ambulatory Visit (HOSPITAL_COMMUNITY)
Admission: RE | Admit: 2023-07-12 | Discharge: 2023-07-12 | Disposition: A | Discharge status: Home / Self Care | Source: Ambulatory Visit | Attending: Family Medicine | Admitting: Family Medicine

## 2023-07-12 ENCOUNTER — Encounter (HOSPITAL_COMMUNITY): Payer: Self-pay

## 2023-07-12 ENCOUNTER — Ambulatory Visit: Payer: Self-pay

## 2023-07-12 VITALS — BP 164/82 | HR 68 | Temp 98.2°F | Resp 16

## 2023-07-12 DIAGNOSIS — M79604 Pain in right leg: Secondary | ICD-10-CM | POA: Diagnosis not present

## 2023-07-12 MED ORDER — TRIAMCINOLONE ACETONIDE 40 MG/ML IJ SUSP
40.0000 mg | Freq: Once | INTRAMUSCULAR | Status: AC
  Administered 2023-07-12: 40 mg via INTRAMUSCULAR

## 2023-07-12 MED ORDER — TRIAMCINOLONE ACETONIDE 40 MG/ML IJ SUSP
INTRAMUSCULAR | Status: AC
  Filled 2023-07-12: qty 1

## 2023-07-12 NOTE — Telephone Encounter (Signed)
 Noted.  Will await UC notes

## 2023-07-12 NOTE — ED Provider Notes (Signed)
 [facility]    CSN: [id number] Arrival date & time: 07/12/23  1612      History   Chief Complaint Chief Complaint  Patient presents with   Leg Pain    Entered by patient    HPI Brenda Flores is a 78 y.o. female.   71-year-old female with known history of lumbar degenerative disc disease.  She comes in today with complaints of right leg pain.  She is describing a burning sensation in the right upper leg and then pain radiating in the right lower leg.  The left leg is normal.  She is not having any calf pain, swelling.  Denies any injuries.  She has been having some back discomfort.  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  No numbness, tingling.  Reports that her leg is somewhat weak.   Leg Pain   Past Medical History:  Diagnosis Date   Achilles rupture, right    Acute foot pain, left 09/26/2018   Bronchitis    CAD (coronary artery disease) Dr Arlester Ladd   non-obs CAD by cath in 7/07 (mLAD 30-40%, EF 65%)   COVID-19 03/23/2022   DDD (degenerative disc disease), lumbar    DJD (degenerative joint disease)    KNEES   GERD (gastroesophageal reflux disease)    History of mitral valve prolapse    PER PT REPORTED MVP  PER DR HARSHAW FROM CARDIAC CATH IN 1987 (APPROX)   History of rectal polyps    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   Hyperlipidemia    Hypertension    Left nephrolithiasis    NON-OBSTRUCTIVE   Liver, polycystic    Lumbar radiculopathy    Osteoporosis    PONV (postoperative nausea and vomiting)    Right ureteral stone    Shoulder pain 05/30/2018   Type 2 diabetes mellitus (HCC)     Patient Active Problem List   Diagnosis Date Noted   COVID-19 virus infection 01/27/2023   Decreased renal function 12/14/2021   Preventative health care 07/06/2021   Left temporal headache 12/29/2020   Family history of colon cancer 12/29/2020   Chronic pain of both hips 09/04/2020   Lower  extremity edema 08/26/2019   Exertional dyspnea 08/26/2019   Fatigue 08/26/2019   Chronic back pain 08/26/2019   Chronic neck pain 12/06/2017   Medicare annual wellness visit, subsequent 11/28/2016   Persistent cough    Diabetes type 2, controlled (HCC)    HLD (hyperlipidemia)    History of mitral valve prolapse 04/05/2012   Liver cyst    Diastolic heart failure, NYHA class 1 (HCC) 04/08/2011   Lumbar radiculopathy 04/07/2011   CAD in native artery 04/07/2011   Lumbar disc disease 04/07/2011   Osteoarthritis 04/07/2011   Hypertension 04/06/2011    Past Surgical History:  Procedure Laterality Date   ACHILLES TENDON SURGERY Right 05/26/2016   Procedure: RIGHT ACHILLES TENDON REPAIR,CALCANEUS SPUR EXCISION;  Surgeon: Ferd Householder, MD;  Location: [facility];  Service: Orthopedics;  Laterality: Right;   CARDIAC CATHETERIZATION  10-17-2005   DR COOPER   NON-OBSTRUCTIVE CAD/   mLAD 30-40%/   NORMAL LVF/  EF 65%   CARDIOVASCULAR STRESS TEST  04-12-2011  DR GREGG TAYLOR   LOW RISK STUDY/  NO ISCHEMIA/  EF 68%   CATARACT EXTRACTION W/ INTRAOCULAR LENS IMPLANT Right 2003   COLONOSCOPY W/ POLYPECTOMY  03-17-2011   CYSTOSCOPY WITH URETEROSCOPY Right 06/14/2013  Procedure: RIGHT  URETEROSCOPY,ureteral and urethral dilitation;  Surgeon: Alanson Alliance, MD;  Location: [facility];  Service: Urology;  Laterality: Right;   HEEL SPUR RESECTION Right 05/26/2016   Procedure: HEEL SPUR EXCISION;  Surgeon: Ferd Householder, MD;  Location: [facility];  Service: Orthopedics;  Laterality: Right;   HEEL SPUR SURGERY Left 2003   TOTAL ABDOMINAL HYSTERECTOMY W/ BILATERAL SALPINGOOPHORECTOMY  1978   TRANSTHORACIC ECHOCARDIOGRAM  04-07-2011   MILD LVH/  MILD FOCAL BASAL SEPTAL HYPERTROPHY/  EF  55-60% /  GRADE I DIASTOLIC DYSFUNCTION    OB History   No obstetric history on file.      Home Medications    Prior to Admission medications   Medication Sig Start  Date End Date Taking? Authorizing Provider  albuterol  (PROVENTIL ) (2.5 MG/3ML) 0.083% nebulizer solution Take 3 mLs (2.5 mg total) by nebulization every 6 (six) hours as needed for wheezing or shortness of breath. 03/23/22  Yes Bedsole, Amy E, MD  albuterol  (VENTOLIN  HFA) 108 (90 Base) MCG/ACT inhaler Inhale 2 puffs into the lungs every 4 (four) hours as needed for shortness of breath. 02/13/23  Yes Gabriel John, NP  amLODipine  (NORVASC ) 5 MG tablet Take 1 tablet (5 mg total) by mouth daily. for blood pressure. 01/27/23  Yes Clark, Katherine K, NP  amoxicillin -clavulanate (AUGMENTIN ) 875-125 MG tablet Take 1 tablet by mouth 2 (two) times daily. 02/21/23   Clark, Katherine K, NP  Blood Glucose Monitoring Suppl DEVI 1 each by Does not apply route in the morning, at noon, and at bedtime. May substitute to any manufacturer covered by patient's insurance. 12/06/22  Yes Clark, Katherine K, NP  carvedilol  (COREG ) 6.25 MG tablet Take 1 tablet (6.25 mg total) by mouth 2 (two) times daily with a meal. for blood pressure. 01/27/23  Yes Clark, Katherine K, NP  cyclobenzaprine  (FLEXERIL ) 5 MG tablet TAKE 1 TABLET BY MOUTH AT BEDTIME AS NEEDED FOR MUSCLE SPASMS. 04/28/22   Clark, Katherine K, NP  gabapentin  (NEURONTIN ) 300 MG capsule TAKE 1 CAPSULE (300 MG TOTAL) BY MOUTH AT BEDTIME. FOR PAIN 02/04/22  Yes Gabriel John, NP  glipiZIDE  (GLUCOTROL  XL) 2.5 MG 24 hr tablet Take 1 tablet (2.5 mg total) by mouth daily with breakfast. for diabetes. 01/27/23  Yes Clark, Katherine K, NP  Glucose Blood (BLOOD GLUCOSE TEST STRIPS) STRP 1 each by In Vitro route in the morning, at noon, and at bedtime. May substitute to any manufacturer covered by patient's insurance. 12/06/22  Yes Gabriel John, NP  guaiFENesin -codeine  100-10 MG/5ML syrup Take 5 mLs by mouth 3 (three) times daily as needed for cough. 02/22/23   Gabriel John, NP  Lancets Misc. MISC 1 each by Does not apply route in the morning, at noon, and at bedtime.  May substitute to any manufacturer covered by patient's insurance. 12/06/22  Yes Gabriel John, NP  omeprazole  (PRILOSEC) 20 MG capsule TAKE 1 CAPSULE BY MOUTH EVERY DAY FOR COUGH 03/13/23  Yes Clark, Katherine K, NP  predniSONE  (DELTASONE ) 20 MG tablet Take 2 tablets by mouth once daily for 5 days. Patient not taking: Reported on 02/21/2023 02/13/23   Gabriel John, NP  rosuvastatin  (CRESTOR ) 20 MG tablet Take 1 tablet (20 mg total) by mouth daily. for cholesterol. 12/07/22 12/07/23 Yes Gabriel John, NP    Family History Family History  Problem Relation Age of Onset   Cancer Mother        breast   Heart  disease Mother    Cancer Father        colon   Cancer Brother        ?lung   Heart disease Brother    Heart disease Maternal Aunt    Breast cancer Neg Hx     Social History Social History   Tobacco Use   Smoking status: Never   Smokeless tobacco: Never  Vaping Use   Vaping status: Never Used  Substance Use Topics   Alcohol use: No    Alcohol/week: 0.0 standard drinks of alcohol   Drug use: No     Allergies   Darvocet [propoxyphene n-acetaminophen ], Propoxyphene, and Tetanus toxoids   Review of Systems Review of Systems See HPI  Physical Exam Triage Vital Signs ED Triage Vitals [07/12/23 1634]  Encounter Vitals Group     BP (!) 164/82     Systolic BP Percentile      Diastolic BP Percentile      Pulse Rate 68     Resp 16     Temp 98.2 F (36.8 C)     Temp Source Oral     SpO2 95 %     Weight      Height      Head Circumference      Peak Flow      Pain Score      Pain Loc      Pain Education      Exclude from Growth Chart    No data found.  Updated Vital Signs BP (!) 164/82 (BP Location: Left Arm)   Pulse 68   Temp 98.2 F (36.8 C) (Oral)   Resp 16   SpO2 95%   Visual Acuity Right Eye Distance:   Left Eye Distance:   Bilateral Distance:    Right Eye Near:   Left Eye Near:    Bilateral Near:     Physical  Exam Constitutional:      Appearance: Normal appearance.  Pulmonary:     Effort: Pulmonary effort is normal.  Musculoskeletal:     Right lower leg: No edema.     Left lower leg: No edema.     Comments: Mildly tender to palpation of the right shin area.  Patient describes burning in the right thigh.  There is no palpable pain in the thigh.  There is no lesions or rashes. No swelling in the lower extremity Sensation intact and 2+ pedal pulse  Skin:    General: Skin is warm and dry.     Findings: No erythema or rash.  Neurological:     Mental Status: She is alert.      UC Treatments / Results  Labs (all labs ordered are listed, but only abnormal results are displayed) Labs Reviewed - No data to display  EKG   Radiology No results found.  Procedures Procedures (including critical care time)  Medications Ordered in UC Medications  triamcinolone  acetonide (KENALOG -40) injection 40 mg (40 mg Intramuscular Given 07/12/23 1720)    Initial Impression / Assessment and Plan / UC Course  I have reviewed the triage vital signs and the nursing notes.  Pertinent labs & imaging results that were available during my care of the patient were reviewed by me and considered in my medical decision making (see chart for details).     Right leg pain-most likely diagnosis is sciatic nerve pain from lower back due to degenerative disc disease. She has had burning sensation to the right upper leg and pain  in the right lower leg with mild weakness. 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  She is a caregiver. We will do a steroid injection here to see if this can help with some of the pain and nerve inflammation in the back and leg. Recommend if symptoms do not improve she will need to follow-up with her primary care. Information given on sciatic nerve pain and back exercises today Final Clinical Impressions(s) / UC Diagnoses   Final diagnoses:   Right leg pain     Discharge Instructions      Believe that this back pain is due to irritation of the sciatic nerve.  I am giving it steroid injection here today. I have printed out some information about sciatic nerve pain and some exercises and stretches you can do. Please follow-up with your primary care for any continued issues    ED Prescriptions   None    PDMP not reviewed this encounter.   Landa Pine, FNP 07/12/23 1806

## 2023-07-12 NOTE — Telephone Encounter (Signed)
 Chief Complaint: Right leg pain, numbness Symptoms: pain, numbness, feeling like hot water is running down it Frequency: since last night Pertinent Negatives: Patient denies numbness of other leg, or arms, neuro symptoms Disposition: [] ED /[x] Urgent Care (no appt availability in office) / [] Appointment(In office/virtual)/ []  [facility]/ [] Home Care/ [] Refused Recommended Disposition /[] [facility]/ []  Follow-up with PCP Additional Notes: Patient called in stating she has been experiencing 10/10 pain in her right leg since last night. Patient states she has numbness in her upper leg when she stands up. Patient states she is a diabetic with neuropathy in toes and states it feels similar. Patient states it also feels like there is hot water being poured down her leg. Patient confirms tylenol  is not helping the pain. Patient referred to [facility] for evaluation. Assisted patient with appt for urgent care for today.   Copied from CRM [id number]. Topic: Clinical - Red Word Triage >> Jul 12, 2023 12:26 PM Kita Perish H wrote: [facility] that prompted transfer to Nurse Triage: Severe pain in right leg feels numb like hot water running through it. Reason for Disposition  [1] SEVERE pain (e.g., excruciating, unable to do any normal activities) AND [2] not improved after 2 hours of pain medicine  Answer Assessment - Initial Assessment Questions 1. ONSET: "When did the pain start?"      Yesterday evening 2. LOCATION: "Where is the pain located?"      Right leg 3. PAIN: "How bad is the pain?"    (Scale 1-10; or mild, moderate, severe)   -  MILD (1-3): doesn't interfere with normal activities    -  MODERATE (4-7): interferes with normal activities (e.g., work or school) or awakens from sleep, limping    -  SEVERE (8-10): excruciating pain, unable to do any normal activities, unable to walk     10 4. WORK OR EXERCISE: "Has there been any recent work or exercise that involved this part of the  body?"      No 5. CAUSE: "What do you think is causing the leg pain?"     Unsure what is causing pain 6. OTHER SYMPTOMS: "Do you have any other symptoms?" (e.g., chest pain, back pain, breathing difficulty, swelling, rash, fever, numbness, weakness)     Feels like hot water running down leg, lose balance  Protocols used: Leg Pain-A-AH

## 2023-07-12 NOTE — Discharge Instructions (Signed)
 Believe that this back pain is due to irritation of the sciatic nerve.  I am giving it steroid injection here today. I have printed out some information about sciatic nerve pain and some exercises and stretches you can do. Please follow-up with your primary care for any continued issues

## 2023-07-12 NOTE — ED Triage Notes (Signed)
 Patient presents to office right leg pain and swelling that started yesterday. Patient reports burning sensation on her leg.

## 2023-07-14 ENCOUNTER — Ambulatory Visit (INDEPENDENT_AMBULATORY_CARE_PROVIDER_SITE_OTHER): Admitting: Internal Medicine

## 2023-07-14 ENCOUNTER — Ambulatory Visit (INDEPENDENT_AMBULATORY_CARE_PROVIDER_SITE_OTHER)
Admission: RE | Admit: 2023-07-14 | Discharge: 2023-07-14 | Disposition: A | Discharge status: Home / Self Care | Source: Ambulatory Visit | Attending: Internal Medicine | Admitting: Internal Medicine

## 2023-07-14 ENCOUNTER — Encounter: Payer: Self-pay | Admitting: Internal Medicine

## 2023-07-14 VITALS — BP 130/88 | HR 60 | Temp 98.5°F | Ht 65.5 in | Wt 192.0 lb

## 2023-07-14 DIAGNOSIS — R200 Anesthesia of skin: Secondary | ICD-10-CM

## 2023-07-14 DIAGNOSIS — M25561 Pain in right knee: Secondary | ICD-10-CM | POA: Diagnosis not present

## 2023-07-14 DIAGNOSIS — M898X6 Other specified disorders of bone, lower leg: Secondary | ICD-10-CM

## 2023-07-14 HISTORY — DX: Other specified disorders of bone, lower leg: M89.8X6

## 2023-07-14 HISTORY — DX: Anesthesia of skin: R20.0

## 2023-07-14 LAB — COMPREHENSIVE METABOLIC PANEL WITH GFR
ALT: 16 U/L (ref 0–35)
AST: 19 U/L (ref 0–37)
Albumin: 4.2 g/dL (ref 3.5–5.2)
Alkaline Phosphatase: 61 U/L (ref 39–117)
BUN: 14 mg/dL (ref 6–23)
CO2: 33 meq/L — ABNORMAL HIGH (ref 19–32)
Calcium: 9.4 mg/dL (ref 8.4–10.5)
Chloride: 106 meq/L (ref 96–112)
Creatinine, Ser: 0.8 mg/dL (ref 0.40–1.20)
GFR: 71.51 mL/min (ref >60.00)
Glucose, Bld: 97 mg/dL (ref 70–99)
Potassium: 4.4 meq/L (ref 3.5–5.1)
Sodium: 144 meq/L (ref 135–145)
Total Bilirubin: 0.3 mg/dL (ref 0.2–1.2)
Total Protein: 7.3 g/dL (ref 6.0–8.3)

## 2023-07-14 LAB — CBC
HCT: 40.7 % (ref 36.0–46.0)
Hemoglobin: 13.2 g/dL (ref 12.0–15.0)
MCHC: 32.5 g/dL (ref 30.0–36.0)
MCV: 90.1 fl (ref 78.0–100.0)
Platelets: 197 10*3/uL (ref 150.0–400.0)
RBC: 4.51 Mil/uL (ref 3.87–5.11)
RDW: 15 % (ref 11.5–15.5)
WBC: 6 10*3/uL (ref 4.0–10.5)

## 2023-07-14 LAB — SEDIMENTATION RATE: Sed Rate: 27 mm/h (ref 0–30)

## 2023-07-14 NOTE — Progress Notes (Signed)
 Subjective:    Patient ID: Brenda Flores, female    DOB: [birth year], 78 y.o.   MRN: [id number]  HPI Here for urgent care follow up  Started 2 days ago with right leg Feels like "heat down my leg"---from hip and felt numb Swelling below right knee Some pain in right low back Leg will feel weak at times--like I have to hold on (when walking)  Went to urgent care Got cortisone injection Felt in neck and chest felt heavy last night--and very slight pain Some cough now.  No SOB  Current Outpatient Medications on File Prior to Visit  Medication Sig Dispense Refill   albuterol  (PROVENTIL ) (2.5 MG/3ML) 0.083% nebulizer solution Take 3 mLs (2.5 mg total) by nebulization every 6 (six) hours as needed for wheezing or shortness of breath. 150 mL 1   albuterol  (VENTOLIN  HFA) 108 (90 Base) MCG/ACT inhaler Inhale 2 puffs into the lungs every 4 (four) hours as needed for shortness of breath. 1 each 0   amLODipine  (NORVASC ) 5 MG tablet Take 1 tablet (5 mg total) by mouth daily. for blood pressure. 90 tablet 2   Blood Glucose Monitoring Suppl DEVI 1 each by Does not apply route in the morning, at noon, and at bedtime. May substitute to any manufacturer covered by patient's insurance. 1 each 0   carvedilol  (COREG ) 6.25 MG tablet Take 1 tablet (6.25 mg total) by mouth 2 (two) times daily with a meal. for blood pressure. 180 tablet 2   cyclobenzaprine  (FLEXERIL ) 5 MG tablet TAKE 1 TABLET BY MOUTH AT BEDTIME AS NEEDED FOR MUSCLE SPASMS. 30 tablet 0   glipiZIDE  (GLUCOTROL  XL) 2.5 MG 24 hr tablet Take 1 tablet (2.5 mg total) by mouth daily with breakfast. for diabetes. 90 tablet 1   Glucose Blood (BLOOD GLUCOSE TEST STRIPS) STRP 1 each by In Vitro route in the morning, at noon, and at bedtime. May substitute to any manufacturer covered by patient's insurance. 200 strip 0   Lancets Misc. MISC 1 each by Does not apply route in the morning, at noon, and at bedtime. May substitute to any manufacturer  covered by patient's insurance. 200 each 0   omeprazole  (PRILOSEC) 20 MG capsule TAKE 1 CAPSULE BY MOUTH EVERY DAY FOR COUGH 90 capsule 0   rosuvastatin  (CRESTOR ) 20 MG tablet Take 1 tablet (20 mg total) by mouth daily. for cholesterol. 90 tablet 3   gabapentin  (NEURONTIN ) 300 MG capsule TAKE 1 CAPSULE (300 MG TOTAL) BY MOUTH AT BEDTIME. FOR PAIN (Patient not taking: Reported on 07/14/2023) 90 capsule 1   No current facility-administered medications on file prior to visit.    Allergies  Allergen Reactions   Darvocet [Propoxyphene N-Acetaminophen ] Nausea And Vomiting   Propoxyphene Other (See Comments)   Tetanus Toxoids     TDAP hives, pt reports she had TD in the past w/ no issues    Past Medical History:  Diagnosis Date   Achilles rupture, right    Acute foot pain, left 09/26/2018   Bronchitis    CAD (coronary artery disease) Dr Arlester Ladd   non-obs CAD by cath in 7/07 (mLAD 30-40%, EF 65%)   COVID-19 03/23/2022   DDD (degenerative disc disease), lumbar    DJD (degenerative joint disease)    KNEES   GERD (gastroesophageal reflux disease)    History of mitral valve prolapse    PER PT REPORTED MVP  PER DR HARSHAW FROM CARDIAC CATH IN 1987 (APPROX)   History of rectal polyps  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   Hyperlipidemia    Hypertension    Left nephrolithiasis    NON-OBSTRUCTIVE   Liver, polycystic    Lumbar radiculopathy    Osteoporosis    PONV (postoperative nausea and vomiting)    Right ureteral stone    Shoulder pain 05/30/2018   Type 2 diabetes mellitus (HCC)     Past Surgical History:  Procedure Laterality Date   ACHILLES TENDON SURGERY Right 05/26/2016   Procedure: RIGHT ACHILLES TENDON REPAIR,CALCANEUS SPUR EXCISION;  Surgeon: Ferd Householder, MD;  Location: [facility];  Service: Orthopedics;  Laterality: Right;   CARDIAC CATHETERIZATION  10-17-2005   DR COOPER   NON-OBSTRUCTIVE CAD/   mLAD 30-40%/   NORMAL LVF/  EF 65%    CARDIOVASCULAR STRESS TEST  04-12-2011  DR Pete Brand TAYLOR   LOW RISK STUDY/  NO ISCHEMIA/  EF 68%   CATARACT EXTRACTION W/ INTRAOCULAR LENS IMPLANT Right 2003   COLONOSCOPY W/ POLYPECTOMY  03-17-2011   CYSTOSCOPY WITH URETEROSCOPY Right 06/14/2013   Procedure: RIGHT  URETEROSCOPY,ureteral and urethral dilitation;  Surgeon: Mark C Ottelin, MD;  Location: [facility];  Service: Urology;  Laterality: Right;   HEEL SPUR RESECTION Right 05/26/2016   Procedure: HEEL SPUR EXCISION;  Surgeon: Ferd Householder, MD;  Location: [facility];  Service: Orthopedics;  Laterality: Right;   HEEL SPUR SURGERY Left 2003   TOTAL ABDOMINAL HYSTERECTOMY W/ BILATERAL SALPINGOOPHORECTOMY  1978   TRANSTHORACIC ECHOCARDIOGRAM  04-07-2011   MILD LVH/  MILD FOCAL BASAL SEPTAL HYPERTROPHY/  EF  55-60% /  GRADE I DIASTOLIC DYSFUNCTION    Family History  Problem Relation Age of Onset   Cancer Mother        breast   Heart disease Mother    Cancer Father        colon   Cancer Brother        ?lung   Heart disease Brother    Heart disease Maternal Aunt    Breast cancer Neg Hx     Social History   Socioeconomic History   Marital status: Widowed    Spouse name: Not on file   Number of children: 2   Years of education: Not on file   Highest education level: Not on file  Occupational History   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  Tobacco Use   Smoking status: Never   Smokeless tobacco: Never  Vaping Use   Vaping status: Never Used  Substance and Sexual Activity   Alcohol use: No    Alcohol/week: 0.0 standard drinks of alcohol   Drug use: No   Sexual activity: Not on file  Other Topics Concern   Not on file  Social History Narrative   Not on file   Social Drivers of Health   Financial Resource Strain: Not on file  Food Insecurity: Not on file  Transportation Needs: Not on file  Physical Activity: Not on file  Stress: Not on file  Social Connections: Not on file  Intimate Partner  Violence: Not on file   Review of Systems No fall and no injury No fever Has had shoulder pain for a while No left hip issues    Objective:   Physical Exam Cardiovascular:     Rate and Rhythm: Normal rate and regular rhythm.     Heart sounds: No murmur heard.    No friction rub. No gallop.  Pulmonary:     Effort: Pulmonary effort is  normal.     Breath sounds: Normal breath sounds. No wheezing or rales.  Musculoskeletal:     Cervical back: Normal range of motion and neck supple.     Comments: No spine or neck tenderness Fairly normal ROM in hips---some tenderness along right trochanteric bursa Right knee is not swollen or tender and no meniscus/ligament findings SLR negative Point tenderness over anterior upper tibia  Lymphadenopathy:     Cervical: No cervical adenopathy.  Neurological:     Comments: Gait slow but normal Pain related weakness in right hip flexors            Assessment & Plan:

## 2023-07-14 NOTE — Assessment & Plan Note (Addendum)
 Could be related to bursitis Location could be meralgia paresthetica---but doesn't explain pain in tibia Has some neck/chest symptoms---doubt PMR with no response to steroid injection--but will check some labs also Discussed trying ice and lidocaine  over the bursa area

## 2023-07-14 NOTE — Patient Instructions (Signed)
 Please try topical lidocaine  (gel or patch) below your knee where it hurts and on the side of your right hip. You can also use ice on the side of your hip when you are having pain

## 2023-07-14 NOTE — Assessment & Plan Note (Addendum)
 Most striking symptom is right tibia pain and point tenderness Knee is fairly normal Will check x-ray--normal except for arthritis in knee Can try topical lidocaine  on this

## 2023-09-14 ENCOUNTER — Ambulatory Visit: Admitting: Dermatology

## 2023-09-14 ENCOUNTER — Encounter: Payer: Self-pay | Admitting: Dermatology

## 2023-09-14 VITALS — BP 132/86 | HR 63

## 2023-09-14 DIAGNOSIS — L820 Inflamed seborrheic keratosis: Secondary | ICD-10-CM

## 2023-09-14 DIAGNOSIS — L821 Other seborrheic keratosis: Secondary | ICD-10-CM

## 2023-09-14 NOTE — Progress Notes (Signed)
    New Patient Visit   Subjective  Brenda Flores is a 78 y.o. female who presents for the following: growths on the face The patient has spots on her face for around 6 years. She said they are getting larger and she is getting more. She did have it evaluated years ago and the doctor removed 2 of them and said they were not cancer and not worrisome. Pt has no hx of skin cancer.   The following portions of the chart were reviewed this encounter and updated as appropriate: medications, allergies, medical history  Review of Systems:  No other skin or systemic complaints except as noted in HPI or Assessment and Plan.  Objective  Well appearing patient in no apparent distress; mood and affect are within normal limits.  A focused examination was performed of the following areas: face  Relevant exam findings are noted in the Assessment and Plan.          Assessment & Plan   Inflammed Seborrheic Keratosis - Assessment: Patient has multiple seborrheic keratoses on arms, legs, and feet. These lesions are benign and not dangerous, but can grow, increase in number, and develop pigmentation. Some have become large, inflamed, and irritated. Patient reports continued development of new lesions. Two lesions were previously removed by another provider and confirmed to be benign. Current focus is on large lesions that interfere with sight or get caught on objects.  - Plan:    Cryotherapy with liquid nitrogen on 3 spots:     - Right temple     - Right cheek     - Toes    Patient education:     - Apply Vaseline or Aquaphor daily until lesions fall off (expected in 2-3 weeks)     - Expect white area or brown spot after lesion falls off, which will return to normal color over time     - Makeup can be worn over treated areas if desired    Provide starter supply of Vaseline or Aquaphor    Schedule follow-up in 6-8 weeks for reassessment and potential additional cryotherapy    Inform  patient that insurance will cover removal if lesions are irritated or obstructive    INFLAMED SEBORRHEIC KERATOSIS (3) right temple, left neck and right cheek (3) Destruction of lesion - right temple, left neck and right cheek (3)  Destruction method: cryotherapy    Return in about 2 months (around 11/14/2023) for f/u isk.  I, Darice Smock, CMA, am acting as scribe for [employer], DO.   Documentation: I have reviewed the above documentation for accuracy and completeness, and I agree with the above.  Delon Lenis, DO     .

## 2023-09-14 NOTE — Patient Instructions (Addendum)
 Cryotherapy Aftercare  Wash gently with soap and water everyday.   Apply Vaseline and Band-Aid daily until healed.   Skin Education : We counseled the patient regarding the following: Sun screen (SPF 30 or greater) should be applied during peak UV exposure (between 10am and 2pm) and reapplied after exercise or swimming.  The ABCDEs of melanoma were reviewed with the patient, and the importance of monthly self-examination of moles was emphasized. Should any moles change in shape or color, or itch, bleed or burn, pt will contact our office for evaluation sooner then their interval appointment.  Plan: Sunscreen Recommendations We recommended a broad spectrum sunscreen with a SPF of 30 or higher.  SPF 30 sunscreens block approximately 97 percent of the sun's harmful rays. Sunscreens should be applied at least 15 minutes prior to expected sun exposure and then every 2 hours after that as long as sun exposure continues. If swimming or exercising sunscreen should be reapplied every 45 minutes to an hour after getting wet or sweating. One ounce, or the equivalent of a shot glass full of sunscreen, is adequate to protect the skin not covered by a bathing suit. We also recommended a lip balm with a sunscreen as well. Sun protective clothing can be used in lieu of sunscreen but must be worn the entire time you are exposed to the sun's rays.   Important Information   Due to recent changes in healthcare laws, you may see results of your pathology and/or laboratory studies on MyChart before the doctors have had a chance to review them. We understand that in some cases there may be results that are confusing or concerning to you. Please understand that not all results are received at the same time and often the doctors may need to interpret multiple results in order to provide you with the best plan of care or course of treatment. Therefore, we ask that you please give [place] 2 business days to thoroughly review all  your results before contacting the office for clarification. Should we see a critical lab result, you will be contacted sooner.     If You Need Anything After Your Visit   If you have any questions or concerns for your doctor, please call our main line at [phone]. If no one answers, please leave a voicemail as directed and we will return your call as soon as possible. Messages left after 4 pm will be answered the following business day.    You may also send [place] a message via MyChart. We typically respond to MyChart messages within 1-2 business days.  For prescription refills, please ask your pharmacy to contact our office. Our fax number is [phone].  If you have an urgent issue when the clinic is closed that cannot wait until the next business day, you can page your doctor at the number below.     Please note that while we do our best to be available for urgent issues outside of office hours, we are not available 24/7.    If you have an urgent issue and are unable to reach [place], you may choose to seek medical care at your doctor's office, retail clinic, urgent care center, or emergency room.   If you have a medical emergency, please immediately call 911 or go to the emergency department. In the event of inclement weather, please call our main line at [phone] for an update on the status of any delays or closures.  Dermatology Medication Tips: Please keep the boxes that  topical medications come in in order to help keep track of the instructions about where and how to use these. Pharmacies typically print the medication instructions only on the boxes and not directly on the medication tubes.   If your medication is too expensive, please contact our office at [phone] or send [place] a message through MyChart.    We are unable to tell what your co-pay for medications will be in advance as this is different depending on your insurance coverage. However, we may be able to find a  substitute medication at lower cost or fill out paperwork to get insurance to cover a needed medication.    If a prior authorization is required to get your medication covered by your insurance company, please allow [place] 1-2 business days to complete this process.   Drug prices often vary depending on where the prescription is filled and some pharmacies may offer cheaper prices.   The website www.goodrx.com contains coupons for medications through different pharmacies. The prices here do not account for what the cost may be with help from insurance (it may be cheaper with your insurance), but the website can give you the price if you did not use any insurance.  - You can print the associated coupon and take it with your prescription to the pharmacy.  - You may also stop by our office during regular business hours and pick up a GoodRx coupon card.  - If you need your prescription sent electronically to a different pharmacy, notify our office through [facility] or by phone at [phone]

## 2023-10-03 ENCOUNTER — Ambulatory Visit (INDEPENDENT_AMBULATORY_CARE_PROVIDER_SITE_OTHER)

## 2023-10-03 VITALS — BP 132/86 | Ht 65.5 in | Wt 188.0 lb

## 2023-10-03 DIAGNOSIS — Z2821 Immunization not carried out because of patient refusal: Secondary | ICD-10-CM

## 2023-10-03 DIAGNOSIS — Z5320 Procedure and treatment not carried out because of patient's decision for unspecified reasons: Secondary | ICD-10-CM

## 2023-10-03 DIAGNOSIS — E119 Type 2 diabetes mellitus without complications: Secondary | ICD-10-CM

## 2023-10-03 DIAGNOSIS — Z0000 Encounter for general adult medical examination without abnormal findings: Secondary | ICD-10-CM

## 2023-10-03 NOTE — Patient Instructions (Signed)
 Ms. Brenda Flores , Thank you for taking time out of your busy schedule to complete your Annual Wellness Visit with me. I enjoyed our conversation and look forward to speaking with you again next year. I, as well as your care team,  appreciate your ongoing commitment to your health goals. Please review the following plan we discussed and let me know if I can assist you in the future. Your Game plan/ To Do List    Referrals: If you haven't heard from the office you've been referred to, please reach out to them at the phone provided.   Follow up Visits: Next Medicare AWV with our clinical staff: 10/03/2024   Have you seen your provider in the last 6 months (3 months if uncontrolled diabetes)? Yes Next Office Visit with your provider: patient states she will call and make a appointment  Clinician Recommendations:  Aim for 30 minutes of exercise or brisk walking, 6-8 glasses of water, and 5 servings of fruits and vegetables each day.       This is a list of the screening recommended for you and due dates:  Health Maintenance  Topic Date Due   Yearly kidney health urinalysis for diabetes  Never done   Zoster (Shingles) Vaccine (2 of 2) 06/23/2021   Complete foot exam   12/15/2022   Eye exam for diabetics  02/03/2023   Hemoglobin A1C  06/05/2023   Flu Shot  10/20/2023   Yearly kidney function blood test for diabetes  07/13/2024   Medicare Annual Wellness Visit  10/02/2024   DTaP/Tdap/Td vaccine (2 - Td or Tdap) 08/30/2027   Pneumococcal Vaccine for age over 35  Completed   DEXA scan (bone density measurement)  Completed   Hepatitis C Screening  Completed   Hepatitis B Vaccine  Aged Out   HPV Vaccine  Aged Out   Meningitis B Vaccine  Aged Out   Colon Cancer Screening  Discontinued   COVID-19 Vaccine  Discontinued    Advanced directives: (Declined) Advance directive discussed with you today. Even though you declined this today, please call our office should you change your mind, and we can  give you the proper paperwork for you to fill out. Advance Care Planning is important because it:  [x]  Makes sure you receive the medical care that is consistent with your values, goals, and preferences  [x]  It provides guidance to your family and loved ones and reduces their decisional burden about whether or not they are making the right decisions based on your wishes.  Follow the link provided in your after visit summary or read over the paperwork we have mailed to you to help you started getting your Advance Directives in place. If you need assistance in completing these, please reach out to us  so that we can help you!  See attachments for Preventive Care and Fall Prevention Tips.

## 2023-10-03 NOTE — Progress Notes (Signed)
 Because this visit was a virtual/telehealth visit,  certain criteria was not obtained, such a blood pressure, CBG if applicable, and timed get up and go. Any medications not marked as taking were not mentioned during the medication reconciliation part of the visit. Any vitals not documented were not able to be obtained due to this being a telehealth visit or patient was unable to self-report a recent blood pressure reading due to a lack of equipment at home via telehealth. Vitals that have been documented are verbally provided by the patient.   This visit was performed by a medical professional under my direct supervision. I was immediately available for consultation/collaboration. I have reviewed and agree with the Annual Wellness Visit documentation.  Subjective:   Brenda Flores is a 78 y.o. who presents for a Medicare Wellness preventive visit.  As a reminder, Annual Wellness Visits don't include a physical exam, and some assessments may be limited, especially if this visit is performed virtually. We may recommend an in-person follow-up visit with your provider if needed.  Visit Complete: Virtual I connected with  Brenda Flores on 10/03/23 by a audio enabled telemedicine application and verified that I am speaking with the correct person using two identifiers.  Patient Location: Home  Provider Location: Home Office  I discussed the limitations of evaluation and management by telemedicine. The patient expressed understanding and agreed to proceed.  Vital Signs: Because this visit was a virtual/telehealth visit, some criteria may be missing or patient reported. Any vitals not documented were not able to be obtained and vitals that have been documented are patient reported.  VideoDeclined- This patient declined Librarian, academic. Therefore the visit was completed with audio only.  Persons Participating in Visit: Patient.  AWV Questionnaire:  No: Patient Medicare AWV questionnaire was not completed prior to this visit.  Cardiac Risk Factors include: advanced age (>79men, >39 women);obesity (BMI >30kg/m2);diabetes mellitus;hypertension     Objective:    Today's Vitals   10/03/23 0829  BP: 132/86  Weight: 188 lb (85.3 kg)  Height: 5' 5.5 (1.664 m)   Body mass index is 30.81 kg/m.     10/03/2023    8:29 AM 12/07/2019   10:30 AM 12/06/2019    6:39 PM 08/29/2017    2:25 AM 09/23/2016   10:55 AM 05/20/2016    2:08 PM 04/28/2016    5:09 PM  Advanced Directives  Does Patient Have a Medical Advance Directive? No No No No  No  Yes  No   Type of Advance Directive      Living will   Would patient like information on creating a medical advance directive? No - Patient declined No - Patient declined No - Patient declined No - Patient declined  Yes (ED - Information included in AVS)   No - Patient declined      Data saved with a previous flowsheet row definition    Current Medications (verified) Outpatient Encounter Medications as of 10/03/2023  Medication Sig   albuterol  (PROVENTIL ) (2.5 MG/3ML) 0.083% nebulizer solution Take 3 mLs (2.5 mg total) by nebulization every 6 (six) hours as needed for wheezing or shortness of breath.   albuterol  (VENTOLIN  HFA) 108 (90 Base) MCG/ACT inhaler Inhale 2 puffs into the lungs every 4 (four) hours as needed for shortness of breath.   amLODipine  (NORVASC ) 5 MG tablet Take 1 tablet (5 mg total) by mouth daily. for blood pressure.   Blood Glucose Monitoring Suppl DEVI 1 each by Does not  apply route in the morning, at noon, and at bedtime. May substitute to any manufacturer covered by patient's insurance.   carvedilol  (COREG ) 6.25 MG tablet Take 1 tablet (6.25 mg total) by mouth 2 (two) times daily with a meal. for blood pressure.   cyclobenzaprine  (FLEXERIL ) 5 MG tablet TAKE 1 TABLET BY MOUTH AT BEDTIME AS NEEDED FOR MUSCLE SPASMS.   gabapentin  (NEURONTIN ) 300 MG capsule TAKE 1 CAPSULE (300 MG TOTAL) BY  MOUTH AT BEDTIME. FOR PAIN   glipiZIDE  (GLUCOTROL  XL) 2.5 MG 24 hr tablet Take 1 tablet (2.5 mg total) by mouth daily with breakfast. for diabetes.   Glucose Blood (BLOOD GLUCOSE TEST STRIPS) STRP 1 each by In Vitro route in the morning, at noon, and at bedtime. May substitute to any manufacturer covered by patient's insurance.   Lancets Misc. MISC 1 each by Does not apply route in the morning, at noon, and at bedtime. May substitute to any manufacturer covered by patient's insurance.   omeprazole  (PRILOSEC) 20 MG capsule TAKE 1 CAPSULE BY MOUTH EVERY DAY FOR COUGH   rosuvastatin  (CRESTOR ) 20 MG tablet Take 1 tablet (20 mg total) by mouth daily. for cholesterol.   No facility-administered encounter medications on file as of 10/03/2023.    Allergies (verified) Darvocet [propoxyphene n-acetaminophen ], Propoxyphene, and Tetanus toxoids   History: Past Medical History:  Diagnosis Date   Achilles rupture, right    Acute foot pain, left 09/26/2018   Bronchitis    CAD (coronary artery disease) Dr Wonda   non-obs CAD by cath in 7/07 (mLAD 30-40%, EF 65%)   COVID-19 03/23/2022   DDD (degenerative disc disease), lumbar    DJD (degenerative joint disease)    KNEES   GERD (gastroesophageal reflux disease)    History of mitral valve prolapse    PER PT REPORTED MVP  PER DR HARSHAW FROM CARDIAC CATH IN 1987 (APPROX)   History of rectal polyps    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   Hyperlipidemia    Hypertension    Left nephrolithiasis    NON-OBSTRUCTIVE   Liver, polycystic    Lumbar radiculopathy    Osteoporosis    PONV (postoperative nausea and vomiting)    Right ureteral stone    Shoulder pain 05/30/2018   Type 2 diabetes mellitus (HCC)    Past Surgical History:  Procedure Laterality Date   ACHILLES TENDON SURGERY Right 05/26/2016   Procedure: RIGHT ACHILLES TENDON REPAIR,CALCANEUS SPUR EXCISION;  Surgeon: Toribio JULIANNA Chancy, MD;  Location: [facility];  Service: Orthopedics;  Laterality: Right;   CARDIAC CATHETERIZATION  10-17-2005   DR COOPER   NON-OBSTRUCTIVE CAD/   mLAD 30-40%/   NORMAL LVF/  EF 65%   CARDIOVASCULAR STRESS TEST  04-12-2011  DR DANELLE TAYLOR   LOW RISK STUDY/  NO ISCHEMIA/  EF 68%   CATARACT EXTRACTION W/ INTRAOCULAR LENS IMPLANT Right 2003   COLONOSCOPY W/ POLYPECTOMY  03-17-2011   CYSTOSCOPY WITH URETEROSCOPY Right 06/14/2013   Procedure: RIGHT  URETEROSCOPY,ureteral and urethral dilitation;  Surgeon: Mark C Ottelin, MD;  Location: [facility];  Service: Urology;  Laterality: Right;   HEEL SPUR RESECTION Right 05/26/2016   Procedure: HEEL SPUR EXCISION;  Surgeon: Toribio JULIANNA Chancy, MD;  Location:  SURGERY CENTER;  Service: Orthopedics;  Laterality: Right;   HEEL SPUR SURGERY Left 2003   TOTAL ABDOMINAL HYSTERECTOMY W/ BILATERAL SALPINGOOPHORECTOMY  1978   TRANSTHORACIC ECHOCARDIOGRAM  04-07-2011   MILD LVH/  MILD FOCAL  BASAL SEPTAL HYPERTROPHY/  EF  55-60% /  GRADE I DIASTOLIC DYSFUNCTION   Family History  Problem Relation Age of Onset   Cancer Mother        breast   Heart disease Mother    Cancer Father        colon   Cancer Brother        ?lung   Heart disease Brother    Heart disease Maternal Aunt    Breast cancer Neg Hx    Social History   Socioeconomic History   Marital status: Widowed    Spouse name: Not on file   Number of children: 2   Years of education: Not on file   Highest education level: Not on file  Occupational History   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  Tobacco Use   Smoking status: Never   Smokeless tobacco: Never  Vaping Use   Vaping status: Never Used  Substance and Sexual Activity   Alcohol use: No    Alcohol/week: 0.0 standard drinks of alcohol   Drug use: No   Sexual activity: Not on file  Other Topics Concern   Not on file  Social History Narrative   Not on file   Social Drivers of Health   Financial Resource Strain: Low Risk  (10/03/2023)   Overall  Financial Resource Strain (CARDIA)    Difficulty of Paying Living Expenses: Not hard at all  Food Insecurity: No Food Insecurity (10/03/2023)   Hunger Vital Sign    Worried About Running Out of Food in the Last Year: Never true    Ran Out of Food in the Last Year: Never true  Transportation Needs: No Transportation Needs (10/03/2023)   PRAPARE - Administrator, Civil Service (Medical): No    Lack of Transportation (Non-Medical): No  Physical Activity: Sufficiently Active (10/03/2023)   Exercise Vital Sign    Days of Exercise per Week: 5 days    Minutes of Exercise per Session: 150+ min  Stress: No Stress Concern Present (10/03/2023)   [employer] of Occupational Health - Occupational Stress Questionnaire    Feeling of Stress: Not at all  Social Connections: Moderately Isolated (10/03/2023)   Social Connection and Isolation Panel    Frequency of Communication with Friends and Family: More than three times a week    Frequency of Social Gatherings with Friends and Family: More than three times a week    Attends Religious Services: More than 4 times per year    Active Member of [employer] or Organizations: No    Attends Banker Meetings: Never    Marital Status: Widowed    Tobacco Counseling Counseling given: Not Answered    Clinical Intake:  Pre-visit preparation completed: Yes  Pain : No/denies pain     BMI - recorded: 30.81 Nutritional Status: BMI > 30  Obese Nutritional Risks: None Diabetes: Yes CBG done?: No Did pt. bring in CBG monitor from home?: No  Lab Results  Component Value Date   HGBA1C 6.1 12/06/2022   HGBA1C 5.8 (A) 08/02/2022   HGBA1C 6.0 (A) 12/14/2021     How often do you need to have someone help you when you read instructions, pamphlets, or other written materials from your doctor or pharmacy?: 1 - Never What is the last grade level you completed in school?: 4 years of college  Interpreter Needed?: No  Information entered  by :: [employer]   Activities of Daily Living     10/03/2023  8:33 AM  In your present state of health, do you have any difficulty performing the following activities:  Hearing? 0  Vision? 0  Difficulty concentrating or making decisions? 0  Walking or climbing stairs? 0  Dressing or bathing? 0  Doing errands, shopping? 0  Preparing Food and eating ? N  Using the Toilet? N  In the past six months, have you accidently leaked urine? Y  Do you have problems with loss of bowel control? N  Managing your Medications? N  Managing your Finances? N  Housekeeping or managing your Housekeeping? N    Patient Care Team: Gretta Comer POUR, NP as PCP - General (Internal Medicine)  I have updated your Care Teams any recent Medical Services you may have received from other providers in the past year.     Assessment:   This is a routine wellness examination for Brenda Flores.  Hearing/Vision screen Hearing Screening - Comments:: No difficulties Vision Screening - Comments:: Patient has no difficulties seeing   Goals Addressed             This Visit's Progress    Patient Stated       Patient would like ,to lose lbs       Depression Screen     10/03/2023    8:35 AM 07/14/2023    8:20 AM 12/06/2022    9:55 AM 05/17/2022    3:05 PM 12/29/2020    2:46 PM 02/27/2020    7:25 AM  PHQ 2/9 Scores  PHQ - 2 Score 0 0 0 0 0 0  PHQ- 9 Score 1   1 0 0    Fall Risk     10/03/2023    8:32 AM 07/14/2023    8:20 AM 12/06/2022    9:55 AM 08/02/2022   10:42 AM 05/17/2022    3:05 PM  Fall Risk   Falls in the past year? 0 0 1 0 0  Number falls in past yr: 0 0 1 0 0  Injury with Fall? 0 0 0 0 0  Risk for fall due to : No Fall Risks No Fall Risks History of fall(s) No Fall Risks No Fall Risks  Follow up Falls evaluation completed Falls evaluation completed Falls evaluation completed Falls evaluation completed Falls evaluation completed    MEDICARE RISK AT HOME:  Medicare Risk at  Home Any stairs in or around the home?: Yes If so, are there any without handrails?: Yes Home free of loose throw rugs in walkways, pet beds, electrical cords, etc?: Yes Adequate lighting in your home to reduce risk of falls?: Yes Life alert?: No Use of a cane, walker or w/c?: No Grab bars in the bathroom?: No Shower chair or bench in shower?: No Elevated toilet seat or a handicapped toilet?: Yes  TIMED UP AND GO:  Was the test performed?  No  Cognitive Function: 6CIT completed        10/03/2023    8:32 AM  6CIT Screen  What Year? 0 points  What month? 0 points  What time? 0 points  Count back from 20 0 points  Months in reverse 0 points  Repeat phrase 0 points  Total Score 0 points    Immunizations Immunization History  Administered Date(s) Administered   Fluad Quad(high Dose 65+) 12/29/2020, 12/14/2021   Influenza,inj,Quad PF,6+ Mos 03/31/2014   PFIZER(Purple Top)SARS-COV-2 Vaccination 05/25/2019, 06/15/2019   Pneumococcal Conjugate-13 11/28/2016   Pneumococcal Polysaccharide-23 03/31/2014   Tdap 08/29/2017   Zoster Recombinant(Shingrix) 04/28/2021  Screening Tests Health Maintenance  Topic Date Due   Diabetic kidney evaluation - Urine ACR  Never done   Zoster Vaccines- Shingrix (2 of 2) 06/23/2021   FOOT EXAM  12/15/2022   OPHTHALMOLOGY EXAM  02/03/2023   HEMOGLOBIN A1C  06/05/2023   INFLUENZA VACCINE  10/20/2023   Diabetic kidney evaluation - eGFR measurement  07/13/2024   Medicare Annual Wellness (AWV)  10/02/2024   DTaP/Tdap/Td (2 - Td or Tdap) 08/30/2027   Pneumococcal Vaccine: 50+ Years  Completed   DEXA SCAN  Completed   Hepatitis C Screening  Completed   Hepatitis B Vaccines  Aged Out   HPV VACCINES  Aged Out   Meningococcal B Vaccine  Aged Out   Colonoscopy  Discontinued   COVID-19 Vaccine  Discontinued    Health Maintenance  Health Maintenance Due  Topic Date Due   Diabetic kidney evaluation - Urine ACR  Never done   Zoster Vaccines-  Shingrix (2 of 2) 06/23/2021   FOOT EXAM  12/15/2022   OPHTHALMOLOGY EXAM  02/03/2023   HEMOGLOBIN A1C  06/05/2023   Health Maintenance Items Addressed: Diabetic kidney evaluation future ordered, foot exam future ordered , also A1c  Additional Screening:  Vision Screening: Recommended annual ophthalmology exams for early detection of glaucoma and other disorders of the eye. Would you like a referral to an eye doctor? No    Dental Screening: Recommended annual dental exams for proper oral hygiene  Community Resource Referral / Chronic Care Management: CRR required this visit?  No   CCM required this visit?  No   Plan:    I have personally reviewed and noted the following in the patient's chart:   Medical and social history Use of alcohol, tobacco or illicit drugs  Current medications and supplements including opioid prescriptions. Patient is not currently taking opioid prescriptions. Functional ability and status Nutritional status Physical activity Advanced directives List of other physicians Hospitalizations, surgeries, and ER visits in previous 12 months Vitals Screenings to include cognitive, depression, and falls Referrals and appointments  In addition, I have reviewed and discussed with patient certain preventive protocols, quality metrics, and best practice recommendations. A written personalized care plan for preventive services as well as general preventive health recommendations were provided to patient.   Lyle MARLA Right, [place]   10/03/2023   After Visit Summary: (MyChart) Due to this being a telephonic visit, the after visit summary with patients personalized plan was offered to patient via MyChart   Notes: Nothing significant to report at this time.

## 2023-10-16 ENCOUNTER — Encounter: Payer: Self-pay | Admitting: Family

## 2023-10-16 ENCOUNTER — Ambulatory Visit (INDEPENDENT_AMBULATORY_CARE_PROVIDER_SITE_OTHER): Admitting: Family

## 2023-10-16 VITALS — BP 160/88 | HR 80 | Temp 98.3°F | Ht 65.5 in | Wt 199.6 lb

## 2023-10-16 DIAGNOSIS — I10 Essential (primary) hypertension: Secondary | ICD-10-CM | POA: Diagnosis not present

## 2023-10-16 DIAGNOSIS — E785 Hyperlipidemia, unspecified: Secondary | ICD-10-CM | POA: Diagnosis not present

## 2023-10-16 DIAGNOSIS — E1165 Type 2 diabetes mellitus with hyperglycemia: Secondary | ICD-10-CM | POA: Diagnosis not present

## 2023-10-16 DIAGNOSIS — R051 Acute cough: Secondary | ICD-10-CM

## 2023-10-16 DIAGNOSIS — J22 Unspecified acute lower respiratory infection: Secondary | ICD-10-CM | POA: Insufficient documentation

## 2023-10-16 MED ORDER — AMOXICILLIN-POT CLAVULANATE 875-125 MG PO TABS: 1.0000 | ORAL_TABLET | Freq: Two times a day (BID) | ORAL | 0 refills | Status: DC

## 2023-10-16 MED ORDER — GUAIFENESIN-CODEINE 100-10 MG/5ML PO SOLN: 5.0000 mL | Freq: Three times a day (TID) | ORAL | 0 refills | Status: AC | PRN

## 2023-10-16 MED ORDER — AMLODIPINE BESYLATE 5 MG PO TABS: 5.0000 mg | ORAL_TABLET | Freq: Every day | ORAL | 0 refills | Status: DC

## 2023-10-16 MED ORDER — ROSUVASTATIN CALCIUM 20 MG PO TABS: 20.0000 mg | ORAL_TABLET | Freq: Every day | ORAL | 0 refills | Status: DC

## 2023-10-16 NOTE — Assessment & Plan Note (Signed)
 Elevated today in office, pt reports compliance with medication however needs refill for amlodipine  Given rx for 30 day supply with strict recommendation to f/u with pcp prior to next refill  Pt verbalized understanding.  Pt advised of the following:  Continue medication as prescribed. Monitor blood pressure periodically and/or when you feel symptomatic. Goal is <130/90 on average. Ensure that you have rested for 30 minutes prior to checking your blood pressure. Record your readings and bring them to your next visit if necessary.work on a low sodium diet.

## 2023-10-16 NOTE — Assessment & Plan Note (Signed)
 Symptoms > 7 days will choose to treat  Take antibiotic as prescribed. Increase oral fluids. Pt to f/u if sx worsen and or fail to improve in 2-3 days. Tylenol  prn fever Guai codeine  for cough at night time prn precautions given.   Advised pt she is overdue for her f/u with her pcp she will make appt she states in the next 1-2 weeks as well to f/u blood pressure

## 2023-10-16 NOTE — Progress Notes (Signed)
 Established Patient Office Visit  Subjective:   Patient ID: Brenda Flores, female    DOB: [birth year]  Age: 78 y.o. MRN: [id number]  CC:  Chief Complaint  Patient presents with   Acute Visit    Reports cough, chest tightness and fatigue x1 week. Home COVID test negative as of 10/15/23.    HPI: Brenda Flores is a 78 y.o. female presenting on 10/16/2023 for Acute Visit (Reports cough, chest tightness and fatigue x1 week. Home COVID test negative as of 10/15/23.)  One week ago started with symptoms of cough, chest congestion, sob and fatigue. She is coughing often and she has pain in both ears 'feel like I have the flu' with body aches. The sob mainly with exertion. No sore throat. She did have a fever two days ago but not since (up to 101F)  She did covid test at home and this was negative.   Taking otc tussin and tylenol  every four hours.        ROS: Negative unless specifically indicated above in HPI.   Relevant past medical history reviewed and updated as indicated.   Allergies and medications reviewed and updated.   Current Outpatient Medications:    albuterol  (PROVENTIL ) (2.5 MG/3ML) 0.083% nebulizer solution, Take 3 mLs (2.5 mg total) by nebulization every 6 (six) hours as needed for wheezing or shortness of breath., Disp: 150 mL, Rfl: 1   albuterol  (VENTOLIN  HFA) 108 (90 Base) MCG/ACT inhaler, Inhale 2 puffs into the lungs every 4 (four) hours as needed for shortness of breath., Disp: 1 each, Rfl: 0   amoxicillin -clavulanate (AUGMENTIN ) 875-125 MG tablet, Take 1 tablet by mouth 2 (two) times daily., Disp: 20 tablet, Rfl: 0   Blood Glucose Monitoring Suppl DEVI, 1 each by Does not apply route in the morning, at noon, and at bedtime. May substitute to any manufacturer covered by patient's insurance., Disp: 1 each, Rfl: 0   carvedilol  (COREG ) 6.25 MG tablet, Take 1 tablet (6.25 mg total) by mouth 2 (two) times daily with a meal. for blood pressure., Disp:  180 tablet, Rfl: 2   cyclobenzaprine  (FLEXERIL ) 5 MG tablet, TAKE 1 TABLET BY MOUTH AT BEDTIME AS NEEDED FOR MUSCLE SPASMS., Disp: 30 tablet, Rfl: 0   gabapentin  (NEURONTIN ) 300 MG capsule, TAKE 1 CAPSULE (300 MG TOTAL) BY MOUTH AT BEDTIME. FOR PAIN, Disp: 90 capsule, Rfl: 1   glipiZIDE  (GLUCOTROL  XL) 2.5 MG 24 hr tablet, Take 1 tablet (2.5 mg total) by mouth daily with breakfast. for diabetes., Disp: 90 tablet, Rfl: 1   Glucose Blood (BLOOD GLUCOSE TEST STRIPS) STRP, 1 each by In Vitro route in the morning, at noon, and at bedtime. May substitute to any manufacturer covered by patient's insurance., Disp: 200 strip, Rfl: 0   guaiFENesin -codeine  100-10 MG/5ML syrup, Take 5 mLs by mouth 3 (three) times daily as needed for up to 5 days., Disp: 75 mL, Rfl: 0   Lancets Misc. MISC, 1 each by Does not apply route in the morning, at noon, and at bedtime. May substitute to any manufacturer covered by patient's insurance., Disp: 200 each, Rfl: 0   omeprazole  (PRILOSEC) 20 MG capsule, TAKE 1 CAPSULE BY MOUTH EVERY DAY FOR COUGH, Disp: 90 capsule, Rfl: 0   amLODipine  (NORVASC ) 5 MG tablet, Take 1 tablet (5 mg total) by mouth daily. for blood pressure., Disp: 30 tablet, Rfl: 0   rosuvastatin  (CRESTOR ) 20 MG tablet, Take 1 tablet (20 mg total) by mouth daily. for cholesterol., Disp: 30 tablet,  Rfl: 0  Allergies  Allergen Reactions   Darvocet [Propoxyphene N-Acetaminophen ] Nausea And Vomiting   Propoxyphene Other (See Comments)   Tetanus Toxoids     TDAP hives, pt reports she had TD in the past w/ no issues    Objective:   BP (!) 160/88   Pulse 80   Temp 98.3 F (36.8 C) (Temporal)   Ht 5' 5.5 (1.664 m)   Wt 199 lb 9.6 oz (90.5 kg)   SpO2 98%   BMI 32.71 kg/m    Physical Exam Constitutional:      General: She is not in acute distress.    Appearance: Normal appearance. She is normal weight. She is not ill-appearing, toxic-appearing or diaphoretic.  HENT:     Head: Normocephalic.     Right Ear:  Tympanic membrane normal.     Left Ear: Tympanic membrane normal.     Nose: Nose normal.     Mouth/Throat:     Mouth: Mucous membranes are dry.     Pharynx: No oropharyngeal exudate or posterior oropharyngeal erythema.  Eyes:     Extraocular Movements: Extraocular movements intact.     Pupils: Pupils are equal, round, and reactive to light.  Cardiovascular:     Rate and Rhythm: Normal rate and regular rhythm.     Pulses: Normal pulses.     Heart sounds: Normal heart sounds.  Pulmonary:     Effort: Pulmonary effort is normal.     Breath sounds: Normal breath sounds.  Musculoskeletal:     Cervical back: Normal range of motion.  Neurological:     General: No focal deficit present.     Mental Status: She is alert and oriented to person, place, and time. Mental status is at baseline.  Psychiatric:        Mood and Affect: Mood normal.        Behavior: Behavior normal.        Thought Content: Thought content normal.        Judgment: Judgment normal.     Assessment & Plan:  Lower respiratory infection Assessment & Plan: Symptoms > 7 days will choose to treat  Take antibiotic as prescribed. Increase oral fluids. Pt to f/u if sx worsen and or fail to improve in 2-3 days. Tylenol  prn fever Guai codeine  for cough at night time prn precautions given.   Advised pt she is overdue for her f/u with her pcp she will make appt she states in the next 1-2 weeks as well to f/u blood pressure  Orders: -     Amoxicillin -Pot Clavulanate; Take 1 tablet by mouth 2 (two) times daily.  Dispense: 20 tablet; Refill: 0  Acute cough -     guaiFENesin -Codeine ; Take 5 mLs by mouth 3 (three) times daily as needed for up to 5 days.  Dispense: 75 mL; Refill: 0  Hyperlipidemia, unspecified hyperlipidemia type -     Rosuvastatin  Calcium ; Take 1 tablet (20 mg total) by mouth daily. for cholesterol.  Dispense: 30 tablet; Refill: 0  Controlled type 2 diabetes mellitus with hyperglycemia, without long-term  current use of insulin  (HCC)  Essential hypertension  Hypertension Assessment & Plan: Elevated today in office, pt reports compliance with medication however needs refill for amlodipine  Given rx for 30 day supply with strict recommendation to f/u with pcp prior to next refill  Pt verbalized understanding.  Pt advised of the following:  Continue medication as prescribed. Monitor blood pressure periodically and/or when you feel symptomatic. Goal is <130/90  on average. Ensure that you have rested for 30 minutes prior to checking your blood pressure. Record your readings and bring them to your next visit if necessary.work on a low sodium diet.   Orders: -     amLODIPine  Besylate; Take 1 tablet (5 mg total) by mouth daily. for blood pressure.  Dispense: 30 tablet; Refill: 0     Follow up plan: Return for f/u with PCP need appt within next 30 days for refill , also to f/u blood pressure .  Ginger Patrick, FNP

## 2023-10-30 ENCOUNTER — Ambulatory Visit: Admitting: Dermatology

## 2023-12-13 ENCOUNTER — Other Ambulatory Visit: Payer: Self-pay | Admitting: Primary Care

## 2023-12-13 DIAGNOSIS — E1165 Type 2 diabetes mellitus with hyperglycemia: Secondary | ICD-10-CM

## 2023-12-13 NOTE — Telephone Encounter (Signed)
Patient is due for CPE/follow up now, this will be required prior to any further refills.   Please schedule, thank you!

## 2023-12-13 NOTE — Telephone Encounter (Signed)
 Lvm/ sent MyChart

## 2023-12-13 NOTE — Progress Notes (Signed)
 Brenda Flores                                          MRN: [id number]   12/13/2023   The [facility] Quality Team Specialist reviewed this patient medical record for the purposes of chart review for care gap closure. The following were reviewed: chart review for care gap closure-kidney health evaluation for diabetes:eGFR  and uACR.    [facility] Quality Team

## 2023-12-18 ENCOUNTER — Ambulatory Visit (INDEPENDENT_AMBULATORY_CARE_PROVIDER_SITE_OTHER): Admitting: Family

## 2023-12-18 VITALS — BP 118/86 | HR 71 | Temp 98.5°F | Wt 196.2 lb

## 2023-12-18 DIAGNOSIS — M7502 Adhesive capsulitis of left shoulder: Secondary | ICD-10-CM | POA: Diagnosis not present

## 2023-12-18 DIAGNOSIS — R053 Chronic cough: Secondary | ICD-10-CM | POA: Diagnosis not present

## 2023-12-18 DIAGNOSIS — R0782 Intercostal pain: Secondary | ICD-10-CM | POA: Diagnosis not present

## 2023-12-18 DIAGNOSIS — R011 Cardiac murmur, unspecified: Secondary | ICD-10-CM | POA: Diagnosis not present

## 2023-12-18 DIAGNOSIS — Z8679 Personal history of other diseases of the circulatory system: Secondary | ICD-10-CM

## 2023-12-18 DIAGNOSIS — R309 Painful micturition, unspecified: Secondary | ICD-10-CM | POA: Diagnosis not present

## 2023-12-18 DIAGNOSIS — R82998 Other abnormal findings in urine: Secondary | ICD-10-CM | POA: Diagnosis not present

## 2023-12-18 LAB — POC URINALSYSI DIPSTICK (AUTOMATED)
Bilirubin, UA: NEGATIVE
Glucose, UA: NEGATIVE
Ketones, UA: NEGATIVE
Nitrite, UA: NEGATIVE
Protein, UA: POSITIVE — AB
Spec Grav, UA: 1.025 (ref 1.010–1.025)
Urobilinogen, UA: 0.2 U/dL
pH, UA: 5.5 (ref 5.0–8.0)

## 2023-12-18 MED ORDER — PANTOPRAZOLE SODIUM 20 MG PO TBEC: 20.0000 mg | DELAYED_RELEASE_TABLET | Freq: Every day | ORAL | 0 refills | Status: DC

## 2023-12-18 MED ORDER — CIPROFLOXACIN HCL 500 MG PO TABS: 500.0000 mg | ORAL_TABLET | Freq: Two times a day (BID) | ORAL | 0 refills | Status: AC

## 2023-12-18 NOTE — Progress Notes (Signed)
 Established Patient Office Visit  Subjective:      CC:  Chief Complaint  Patient presents with   Acute Visit    Painful urination    HPI: Brenda Flores is a 78 y.o. female presenting on 12/18/2023 for Acute Visit (Painful urination) .  Discussed the use of AI scribe software for clinical note transcription with the patient, who gave verbal consent to proceed.  History of Present Illness Brenda Flores is a 78 year old female with mitral valve prolapse who presents with urinary symptoms and persistent cough.  Approximately three weeks ago, she began experiencing difficulty urinating. Symptoms recurred. On Friday, she experienced severe pain described as 'something cutting me terrible' and noticed cloudy urine. She also had a fever of 101F on Friday and currently experiences chills. No lower abdominal pain, back pain, or current fever. She reports increased urinary frequency but no urgency.  She has a persistent cough that disrupts her sleep. The cough is described as 'unreal' and is not improving. She denies burning in the throat but reports itchy eyes. She does not take any allergy medications. Additionally, she experiences heartburn, which worsens when lying down.  She has a history of mitral valve prolapse and reports chest pain that worsened slightly about two weeks ago, she does mention this has occurred intermittently for years. The pain is dull and achy, located on the left side, and worsens with activity. She experiences fatigue with exertion, such as climbing stairs. She recalls having an echocardiogram in the past, and the cardiologist told her everything was fine. She has not seen a cardiologist since 2022.  She reports shoulder pain with limited range of motion, present since 2022 and recently intensified. The pain is constant and does not improve with Tylenol  or ibuprofen . She has not seen an orthopedist or undergone physical therapy for this  issue.         Social history:  Relevant past medical, surgical, family and social history reviewed and updated as indicated. Interim medical history since our last visit reviewed.  Allergies and medications reviewed and updated.  DATA REVIEWED: CHART IN EPIC     ROS: Negative unless specifically indicated above in HPI.    Current Outpatient Medications:    albuterol  (PROVENTIL ) (2.5 MG/3ML) 0.083% nebulizer solution, Take 3 mLs (2.5 mg total) by nebulization every 6 (six) hours as needed for wheezing or shortness of breath., Disp: 150 mL, Rfl: 1   albuterol  (VENTOLIN  HFA) 108 (90 Base) MCG/ACT inhaler, Inhale 2 puffs into the lungs every 4 (four) hours as needed for shortness of breath., Disp: 1 each, Rfl: 0   amLODipine  (NORVASC ) 5 MG tablet, Take 1 tablet (5 mg total) by mouth daily. for blood pressure., Disp: 30 tablet, Rfl: 0   Blood Glucose Monitoring Suppl DEVI, 1 each by Does not apply route in the morning, at noon, and at bedtime. May substitute to any manufacturer covered by patient's insurance., Disp: 1 each, Rfl: 0   carvedilol  (COREG ) 6.25 MG tablet, Take 1 tablet (6.25 mg total) by mouth 2 (two) times daily with a meal. for blood pressure., Disp: 180 tablet, Rfl: 2   ciprofloxacin  (CIPRO ) 500 MG tablet, Take 1 tablet (500 mg total) by mouth 2 (two) times daily for 3 days., Disp: 6 tablet, Rfl: 0   cyclobenzaprine  (FLEXERIL ) 5 MG tablet, TAKE 1 TABLET BY MOUTH AT BEDTIME AS NEEDED FOR MUSCLE SPASMS., Disp: 30 tablet, Rfl: 0   gabapentin  (NEURONTIN ) 300 MG capsule, TAKE 1 CAPSULE (  300 MG TOTAL) BY MOUTH AT BEDTIME. FOR PAIN, Disp: 90 capsule, Rfl: 1   glipiZIDE  (GLUCOTROL  XL) 2.5 MG 24 hr tablet, TAKE 1 TABLET BY MOUTH DAILY WITH BREAKFAST FOR DIABETES., Disp: 30 tablet, Rfl: 0   Glucose Blood (BLOOD GLUCOSE TEST STRIPS) STRP, 1 each by In Vitro route in the morning, at noon, and at bedtime. May substitute to any manufacturer covered by patient's insurance., Disp: 200  strip, Rfl: 0   Lancets Misc. MISC, 1 each by Does not apply route in the morning, at noon, and at bedtime. May substitute to any manufacturer covered by patient's insurance., Disp: 200 each, Rfl: 0   pantoprazole  (PROTONIX ) 20 MG tablet, Take 1 tablet (20 mg total) by mouth daily., Disp: 90 tablet, Rfl: 0   rosuvastatin  (CRESTOR ) 20 MG tablet, Take 1 tablet (20 mg total) by mouth daily. for cholesterol., Disp: 30 tablet, Rfl: 0        Objective:        BP 118/86 (BP Location: Left Arm, Patient Position: Sitting, Cuff Size: Large)   Pulse 71   Temp 98.5 F (36.9 C) (Temporal)   Wt 196 lb 3.2 oz (89 kg)   SpO2 97%   BMI 32.15 kg/m   Physical Exam MUSCULOSKELETAL: Limited range of motion in the shoulder with pain on forward movement and lifting arm forward.  Wt Readings from Last 3 Encounters:  12/18/23 196 lb 3.2 oz (89 kg)  10/16/23 199 lb 9.6 oz (90.5 kg)  10/03/23 188 lb (85.3 kg)    Physical Exam Constitutional:      General: She is not in acute distress.    Appearance: Normal appearance. She is normal weight. She is not ill-appearing, toxic-appearing or diaphoretic.  Cardiovascular:     Rate and Rhythm: Normal rate and regular rhythm.     Heart sounds: Murmur heard.     Systolic murmur is present.  Pulmonary:     Effort: Pulmonary effort is normal.  Abdominal:     General: Abdomen is flat.     Tenderness: There is no abdominal tenderness. There is no right CVA tenderness or left CVA tenderness.  Musculoskeletal:     Left shoulder: Tenderness present. Decreased range of motion.     Comments: Positive apleys Positive drop can  Limited ROM unable to lift > 90 degrees  Neurological:     General: No focal deficit present.     Mental Status: She is alert and oriented to person, place, and time. Mental status is at baseline.     Motor: No weakness.  Psychiatric:        Mood and Affect: Mood normal.        Behavior: Behavior normal.        Thought Content: Thought  content normal.        Judgment: Judgment normal.          Results DIAGNOSTIC TTE: Normal left ventricular ejection fraction (60-65%), moderate left ventricular hypertrophy, grade 1 diastolic dysfunction, elevated left ventricular end-diastolic pressure, normal right ventricular function, normal pulmonary artery systolic pressure, structurally normal mitral valve with no regurgitation or stenosis, tricuspid aortic valve with mild regurgitation, no aortic stenosis, mild dilatation of the ascending aorta (2022).  Assessment & Plan:   Assessment and Plan Assessment & Plan Urinary tract infection Symptoms include urinary frequency, urgency, and dysuria, with fever and chills suggesting possible progression. Differential includes cystitis and pyelonephritis, but absence of back pain or severe symptoms reduces likelihood of kidney involvement. -  Send urine for dipstick analysis - Prescribe ciprofloxacin  500 mg orally twice daily for 3 days -er precaution discussed - Send prescription to [facility]  Persistent cough Persistent cough without pharyngitis or rhinorrhea. Itchy eyes suggest possible allergic etiology. Differential includes allergies and gastroesophageal reflux disease, as symptoms worsen when supine. -stop omeprazole  start pantoprazole  20 mg to r/u worsening GERD -could also consider allergic rhinitis  Left shoulder pain and limited range of motion Chronic left shoulder pain with limited range of motion, worsening over two weeks. Pain is constant and unresponsive to acetaminophen  or ibuprofen . Suspected musculoskeletal origin. - consider orthopedist for evaluation - referral placed for physical therapy  Non-cardiac chest pain Intermittent chest pain, exacerbated by activity. Previous echocardiogram showed normal cardiac function. Suspected musculoskeletal origin. - Perform EKG today -EKG with heart rate 65 bpm, LVH , QT 436 normal limits, p wave abn lead v2 Possible LAE -  Refer to cardiology for further evaluation, echocardiogram ordered pending  -er precautions advised   Heart murmur with history of mitral valve prolapse Heart murmur consistent with mitral valve prolapse. Previous echocardiogram showed no mitral valve regurgitation or stenosis. - Refer to cardiology for follow-up  Moderate left ventricular hypertrophy and grade 1 diastolic dysfunction Previous echocardiogram showed moderate left ventricular hypertrophy and grade 1 diastolic dysfunction, likely secondary to long-standing hypertension. - Refer to cardiology for follow-up  Aortic root dilation Previous echocardiogram noted mild dilation of the ascending aorta, requiring monitoring due to potential risk of progression. - Refer to cardiology for follow-up        Return in about 2 weeks (around 01/01/2024) for follow up with pcp in regards to multiple concerns.     Ginger Patrick, MSN, APRN, FNP-C [facility]

## 2023-12-18 NOTE — Patient Instructions (Signed)
 Referral placed to cardiology  and physical therapy  Start antibiotic for urinary tract infection

## 2023-12-20 ENCOUNTER — Ambulatory Visit: Payer: Self-pay | Admitting: Family

## 2023-12-22 LAB — URINALYSIS W MICROSCOPIC + REFLEX CULTURE
Bacteria, UA: NONE SEEN /HPF
Bilirubin Urine: NEGATIVE
Glucose, UA: NEGATIVE
Hyaline Cast: NONE SEEN /LPF
Ketones, ur: NEGATIVE
Nitrites, Initial: NEGATIVE
Specific Gravity, Urine: 1.019 (ref 1.001–1.035)
pH: 5.5 (ref 5.0–8.0)

## 2023-12-22 LAB — URINE CULTURE
MICRO NUMBER:: 17034780
SPECIMEN QUALITY:: ADEQUATE

## 2023-12-22 LAB — CULTURE INDICATED

## 2023-12-25 ENCOUNTER — Encounter (HOSPITAL_COMMUNITY): Payer: Self-pay | Admitting: Family

## 2024-01-03 ENCOUNTER — Encounter: Payer: Self-pay | Admitting: Primary Care

## 2024-01-03 ENCOUNTER — Ambulatory Visit: Admitting: Primary Care

## 2024-01-03 VITALS — BP 124/82 | HR 67 | Temp 97.7°F | Ht 65.5 in | Wt 196.0 lb

## 2024-01-03 DIAGNOSIS — J0110 Acute frontal sinusitis, unspecified: Secondary | ICD-10-CM | POA: Diagnosis not present

## 2024-01-03 DIAGNOSIS — Z0000 Encounter for general adult medical examination without abnormal findings: Secondary | ICD-10-CM | POA: Diagnosis not present

## 2024-01-03 DIAGNOSIS — M25551 Pain in right hip: Secondary | ICD-10-CM

## 2024-01-03 DIAGNOSIS — I10 Essential (primary) hypertension: Secondary | ICD-10-CM

## 2024-01-03 DIAGNOSIS — M545 Low back pain, unspecified: Secondary | ICD-10-CM

## 2024-01-03 DIAGNOSIS — M159 Polyosteoarthritis, unspecified: Secondary | ICD-10-CM | POA: Diagnosis not present

## 2024-01-03 DIAGNOSIS — E119 Type 2 diabetes mellitus without complications: Secondary | ICD-10-CM

## 2024-01-03 DIAGNOSIS — E2839 Other primary ovarian failure: Secondary | ICD-10-CM

## 2024-01-03 DIAGNOSIS — Z0001 Encounter for general adult medical examination with abnormal findings: Secondary | ICD-10-CM | POA: Insufficient documentation

## 2024-01-03 DIAGNOSIS — M25552 Pain in left hip: Secondary | ICD-10-CM

## 2024-01-03 DIAGNOSIS — I2510 Atherosclerotic heart disease of native coronary artery without angina pectoris: Secondary | ICD-10-CM | POA: Diagnosis not present

## 2024-01-03 DIAGNOSIS — Z1231 Encounter for screening mammogram for malignant neoplasm of breast: Secondary | ICD-10-CM

## 2024-01-03 DIAGNOSIS — E785 Hyperlipidemia, unspecified: Secondary | ICD-10-CM

## 2024-01-03 DIAGNOSIS — G8929 Other chronic pain: Secondary | ICD-10-CM

## 2024-01-03 DIAGNOSIS — J019 Acute sinusitis, unspecified: Secondary | ICD-10-CM | POA: Insufficient documentation

## 2024-01-03 DIAGNOSIS — I5030 Unspecified diastolic (congestive) heart failure: Secondary | ICD-10-CM

## 2024-01-03 MED ORDER — CYCLOBENZAPRINE HCL 5 MG PO TABS: ORAL_TABLET | ORAL | 0 refills | Status: DC

## 2024-01-03 MED ORDER — AMOXICILLIN-POT CLAVULANATE 875-125 MG PO TABS: 1.0000 | ORAL_TABLET | Freq: Two times a day (BID) | ORAL | 0 refills | Status: DC

## 2024-01-03 NOTE — Assessment & Plan Note (Signed)
 Stable.  Continue gabapentin  300 mg at bedtime and cyclobenzaprine  5 mg as needed.

## 2024-01-03 NOTE — Assessment & Plan Note (Addendum)
 Repeat lipid panel pending. ? ?Continue rosuvastatin 20 mg daily. ?

## 2024-01-03 NOTE — Patient Instructions (Signed)
 Start Augmentin  antibiotics. Take 1 tablet by mouth twice daily for 7 days.  Stop by the lab prior to leaving today. I will notify you of your results once received.   Please schedule a follow up visit for 6 months for a diabetes check.  It was a pleasure to see you today!

## 2024-01-03 NOTE — Progress Notes (Signed)
 Subjective:    Patient ID: Brenda Flores, female    DOB: 04/24/1945, 78 y.o.   MRN: [id number]  Brenda Flores is a very pleasant 78 y.o. female who presents today for complete physical and follow up of chronic conditions.  She would also like to discuss sinus pressure. Symptom onset 3 weeks ago with left frontal sinus pressure. She then developed post nasal drip with cough, ear pressure. She denies fevers. She's been taking Tussin without improvement.   Immunizations: -Tetanus: Completed in 2019 -Influenza: Declines today -Shingles: Completed Shingrix vaccine x 1 -Pneumonia: Completed Prevnar 13 in 2018, Pneumovax 23  in 2016  Diet: Fair diet.  Exercise: No regular exercise.  Eye exam: Completed > 1 year ago Dental exam: Completes semi-annually    Mammogram: Completed in April 2023 Bone Density Scan: Completed in 2019  Colonoscopy: Completed > 10 years ago, N/A given age.   BP Readings from Last 3 Encounters:  01/03/24 124/82  12/18/23 118/86  10/16/23 (!) 160/88      Review of Systems  Constitutional:  Negative for unexpected weight change.  HENT:  Positive for postnasal drip, rhinorrhea and sinus pressure.   Respiratory:  Negative for cough and shortness of breath.   Cardiovascular:  Negative for chest pain.  Gastrointestinal:  Negative for constipation and diarrhea.  Genitourinary:  Negative for difficulty urinating.  Musculoskeletal:  Positive for arthralgias and back pain.  Skin:  Negative for rash.  Allergic/Immunologic: Negative for environmental allergies.  Neurological:  Positive for headaches. Negative for dizziness and numbness.  Psychiatric/Behavioral:  The patient is not nervous/anxious.          Past Medical History:  Diagnosis Date   Achilles rupture, right    Acute foot pain, left 09/26/2018   Bronchitis    CAD (coronary artery disease) Dr Wonda   non-obs CAD by cath in 7/07 (mLAD 30-40%, EF 65%)   COVID-19 03/23/2022    DDD (degenerative disc disease), lumbar    DJD (degenerative joint disease)    KNEES   GERD (gastroesophageal reflux disease)    History of mitral valve prolapse    PER PT REPORTED MVP  PER DR HARSHAW FROM CARDIAC CATH IN 1987 (APPROX)   History of rectal polyps    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   Hyperlipidemia    Hypertension    Left nephrolithiasis    NON-OBSTRUCTIVE   Left temporal headache 12/29/2020   Liver, polycystic    Lumbar radiculopathy    Osteoporosis    Pain of right tibia 07/14/2023   PONV (postoperative nausea and vomiting)    Right leg numbness 07/14/2023   Right ureteral stone    Shoulder pain 05/30/2018   Type 2 diabetes mellitus (HCC)     Social History   Socioeconomic History   Marital status: Widowed    Spouse name: Not on file   Number of children: 2   Years of education: Not on file   Highest education level: Not on file  Occupational History   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  Tobacco Use   Smoking status: Never   Smokeless tobacco: Never  Vaping Use   Vaping status: Never Used  Substance and Sexual Activity   Alcohol use: No    Alcohol/week: 0.0 standard drinks of alcohol   Drug use: No   Sexual activity: Not on file  Other Topics Concern   Not on file  Social History Narrative   Not on file   Social Drivers of  Health   Financial Resource Strain: Low Risk  (10/03/2023)   Overall Financial Resource Strain (CARDIA)    Difficulty of Paying Living Expenses: Not hard at all  Food Insecurity: No Food Insecurity (10/03/2023)   Hunger Vital Sign    Worried About Running Out of Food in the Last Year: Never true    Ran Out of Food in the Last Year: Never true  Transportation Needs: No Transportation Needs (10/03/2023)   PRAPARE - Administrator, Civil Service (Medical): No    Lack of Transportation (Non-Medical): No  Physical Activity: Sufficiently Active (10/03/2023)   Exercise Vital Sign    Days of Exercise per  Week: 5 days    Minutes of Exercise per Session: 150+ min  Stress: No Stress Concern Present (10/03/2023)   [employer] of Occupational Health - Occupational Stress Questionnaire    Feeling of Stress: Not at all  Social Connections: Moderately Isolated (10/03/2023)   Social Connection and Isolation Panel    Frequency of Communication with Friends and Family: More than three times a week    Frequency of Social Gatherings with Friends and Family: More than three times a week    Attends Religious Services: More than 4 times per year    Active Member of [employer] or Organizations: No    Attends Banker Meetings: Never    Marital Status: Widowed  Intimate Partner Violence: Not At Risk (10/03/2023)   Humiliation, Afraid, Rape, and Kick questionnaire    Fear of Current or Ex-Partner: No    Emotionally Abused: No    Physically Abused: No    Sexually Abused: No    Past Surgical History:  Procedure Laterality Date   ACHILLES TENDON SURGERY Right 05/26/2016   Procedure: RIGHT ACHILLES TENDON REPAIR,CALCANEUS SPUR EXCISION;  Surgeon: Toribio JULIANNA Chancy, MD;  Location: [facility];  Service: Orthopedics;  Laterality: Right;   CARDIAC CATHETERIZATION  10-17-2005   DR COOPER   NON-OBSTRUCTIVE CAD/   mLAD 30-40%/   NORMAL LVF/  EF 65%   CARDIOVASCULAR STRESS TEST  04-12-2011  DR DANELLE TAYLOR   LOW RISK STUDY/  NO ISCHEMIA/  EF 68%   CATARACT EXTRACTION W/ INTRAOCULAR LENS IMPLANT Right 2003   COLONOSCOPY W/ POLYPECTOMY  03-17-2011   CYSTOSCOPY WITH URETEROSCOPY Right 06/14/2013   Procedure: RIGHT  URETEROSCOPY,ureteral and urethral dilitation;  Surgeon: Mark C Ottelin, MD;  Location: [facility];  Service: Urology;  Laterality: Right;   HEEL SPUR RESECTION Right 05/26/2016   Procedure: HEEL SPUR EXCISION;  Surgeon: Toribio JULIANNA Chancy, MD;  Location: [facility];  Service: Orthopedics;  Laterality: Right;   HEEL SPUR SURGERY Left 2003   TOTAL ABDOMINAL  HYSTERECTOMY W/ BILATERAL SALPINGOOPHORECTOMY  1978   TRANSTHORACIC ECHOCARDIOGRAM  04-07-2011   MILD LVH/  MILD FOCAL BASAL SEPTAL HYPERTROPHY/  EF  55-60% /  GRADE I DIASTOLIC DYSFUNCTION    Family History  Problem Relation Age of Onset   Cancer Mother        breast   Heart disease Mother    Cancer Father        colon   Cancer Brother        ?lung   Heart disease Brother    Heart disease Maternal Aunt    Breast cancer Neg Hx     Allergies  Allergen Reactions   Darvocet [Propoxyphene N-Acetaminophen ] Nausea And Vomiting   Propoxyphene Other (See Comments)   Tetanus Toxoid-Containing Vaccines  TDAP hives, pt reports she had TD in the past w/ no issues    Current Outpatient Medications on File Prior to Visit  Medication Sig Dispense Refill   albuterol  (PROVENTIL ) (2.5 MG/3ML) 0.083% nebulizer solution Take 3 mLs (2.5 mg total) by nebulization every 6 (six) hours as needed for wheezing or shortness of breath. 150 mL 1   albuterol  (VENTOLIN  HFA) 108 (90 Base) MCG/ACT inhaler Inhale 2 puffs into the lungs every 4 (four) hours as needed for shortness of breath. 1 each 0   amLODipine  (NORVASC ) 5 MG tablet Take 1 tablet (5 mg total) by mouth daily. for blood pressure. 30 tablet 0   Blood Glucose Monitoring Suppl DEVI 1 each by Does not apply route in the morning, at noon, and at bedtime. May substitute to any manufacturer covered by patient's insurance. 1 each 0   carvedilol  (COREG ) 6.25 MG tablet Take 1 tablet (6.25 mg total) by mouth 2 (two) times daily with a meal. for blood pressure. 180 tablet 2   gabapentin  (NEURONTIN ) 300 MG capsule TAKE 1 CAPSULE (300 MG TOTAL) BY MOUTH AT BEDTIME. FOR PAIN 90 capsule 1   glipiZIDE  (GLUCOTROL  XL) 2.5 MG 24 hr tablet TAKE 1 TABLET BY MOUTH DAILY WITH BREAKFAST FOR DIABETES. 30 tablet 0   Glucose Blood (BLOOD GLUCOSE TEST STRIPS) STRP 1 each by In Vitro route in the morning, at noon, and at bedtime. May substitute to any manufacturer covered by  patient's insurance. 200 strip 0   Lancets Misc. MISC 1 each by Does not apply route in the morning, at noon, and at bedtime. May substitute to any manufacturer covered by patient's insurance. 200 each 0   rosuvastatin  (CRESTOR ) 20 MG tablet Take 1 tablet (20 mg total) by mouth daily. for cholesterol. 30 tablet 0   No current facility-administered medications on file prior to visit.    BP 124/82   Pulse 67   Temp 97.7 F (36.5 C) (Oral)   Ht 5' 5.5 (1.664 m)   Wt 196 lb (88.9 kg)   SpO2 95%   BMI 32.12 kg/m  Objective:   Physical Exam HENT:     Right Ear: Tympanic membrane and ear canal normal.     Left Ear: Tympanic membrane and ear canal normal.  Eyes:     Pupils: Pupils are equal, round, and reactive to light.  Cardiovascular:     Rate and Rhythm: Normal rate and regular rhythm.  Pulmonary:     Effort: Pulmonary effort is normal.     Breath sounds: Normal breath sounds.  Abdominal:     General: Bowel sounds are normal.     Palpations: Abdomen is soft.     Tenderness: There is no abdominal tenderness.  Musculoskeletal:        General: Normal range of motion.     Cervical back: Neck supple.  Skin:    General: Skin is warm and dry.  Neurological:     Mental Status: She is alert and oriented to person, place, and time.     Cranial Nerves: No cranial nerve deficit.     Deep Tendon Reflexes:     Reflex Scores:      Patellar reflexes are 2+ on the right side and 2+ on the left side. Psychiatric:        Mood and Affect: Mood normal.     Physical Exam        Assessment & Plan:  Encounter for annual general medical examination with abnormal findings  in adult Assessment & Plan: Discussed second Shingrix vaccine.  We also discussed influenza vaccine for which she will complete once she is well. Mammogram due, orders placed. Bone density scan due, orders placed. Colonoscopy declined given age  Discussed the importance of a healthy diet and regular exercise in  order for weight loss, and to reduce the risk of further co-morbidity.  Exam stable. Labs pending.  Follow up in 1 year for repeat physical.    Osteoarthritis of multiple joints, unspecified osteoarthritis type Assessment & Plan: Stable.  Continue gabapentin  300 mg at bedtime, cyclobenzaprine  5 mg as needed.  Refill provided.  Orders: -     Cyclobenzaprine  HCl; TAKE 1 TABLET BY MOUTH AT BEDTIME AS NEEDED FOR MUSCLE SPASMS.  Dispense: 90 tablet; Refill: 0  Chronic pain of both hips Assessment & Plan: Stable.  Continue gabapentin  300 mg at bedtime, cyclobenzaprine  5 mg as needed.  Orders: -     Cyclobenzaprine  HCl; TAKE 1 TABLET BY MOUTH AT BEDTIME AS NEEDED FOR MUSCLE SPASMS.  Dispense: 90 tablet; Refill: 0  Hypertension Assessment & Plan: Controlled.  Continue carvedilol  6.25 mg twice daily.  Orders: -     Comprehensive metabolic panel with GFR  Diastolic heart failure, NYHA class 1 (HCC) Assessment & Plan: Appears euvolemic today  Continue carvedilol  6.25 mg twice daily   CAD in native artery Assessment & Plan: Asymptomatic  Continue blood pressure control, lipid control, diabetes control   Diabetes type 2, controlled (HCC) Assessment & Plan: Repeat A1C pending.   Continue glipizide  XL 2.5 mg   Follow-up in 3 to 6 months based on A1c result.  Orders: -     Hemoglobin A1c -     Microalbumin / creatinine urine ratio  Chronic bilateral low back pain, unspecified whether sciatica present Assessment & Plan: Stable.  Continue gabapentin  300 mg at bedtime and cyclobenzaprine  5 mg as needed.   Acute non-recurrent frontal sinusitis Assessment & Plan: Symptoms suggestive  Given duration of treatment, coupled with ongoing symptoms, will treat for presumed bacterial involvement  Start Augmentin  antibiotics. Take 1 tablet by mouth twice daily for 7 days. We also discussed use of Flonase nasal spray.  Follow-up as needed  Orders: -      Amoxicillin -Pot Clavulanate; Take 1 tablet by mouth 2 (two) times daily.  Dispense: 14 tablet; Refill: 0  Hyperlipidemia, unspecified hyperlipidemia type Assessment & Plan: Repeat lipid panel pending.  Continue rosuvastatin  20 mg daily.  Orders: -     Lipid panel  Screening mammogram for breast cancer -     3D Screening Mammogram, Left and Right; Future  Estrogen deficiency -     DG Bone Density; Future    Assessment and Plan Assessment & Plan         Comer MARLA Gaskins, NP     History of Present Illness

## 2024-01-03 NOTE — Assessment & Plan Note (Signed)
 Symptoms suggestive  Given duration of treatment, coupled with ongoing symptoms, will treat for presumed bacterial involvement  Start Augmentin  antibiotics. Take 1 tablet by mouth twice daily for 7 days. We also discussed use of Flonase nasal spray.  Follow-up as needed

## 2024-01-03 NOTE — Assessment & Plan Note (Signed)
 Appears euvolemic today  Continue carvedilol  6.25 mg twice daily

## 2024-01-03 NOTE — Assessment & Plan Note (Signed)
Asymptomatic.  Continue blood pressure control, lipid control, diabetes control.

## 2024-01-03 NOTE — Assessment & Plan Note (Signed)
 Repeat A1C pending.   Continue glipizide  XL 2.5 mg   Follow-up in 3 to 6 months based on A1c result.

## 2024-01-03 NOTE — Assessment & Plan Note (Signed)
 Controlled.  Continue carvedilol  6.25 mg twice daily.

## 2024-01-03 NOTE — Assessment & Plan Note (Signed)
 Stable.  Continue gabapentin  300 mg at bedtime, cyclobenzaprine  5 mg as needed.  Refill provided.

## 2024-01-03 NOTE — Assessment & Plan Note (Signed)
 Stable.  Continue gabapentin  300 mg at bedtime, cyclobenzaprine  5 mg as needed.

## 2024-01-03 NOTE — Assessment & Plan Note (Signed)
 Discussed second Shingrix vaccine.  We also discussed influenza vaccine for which she will complete once she is well. Mammogram due, orders placed. Bone density scan due, orders placed. Colonoscopy declined given age  Discussed the importance of a healthy diet and regular exercise in order for weight loss, and to reduce the risk of further co-morbidity.  Exam stable. Labs pending.  Follow up in 1 year for repeat physical.

## 2024-01-04 ENCOUNTER — Ambulatory Visit: Payer: Self-pay | Admitting: Primary Care

## 2024-01-04 LAB — COMPREHENSIVE METABOLIC PANEL WITH GFR
ALT: 14 U/L (ref 0–35)
AST: 18 U/L (ref 0–37)
Albumin: 4.3 g/dL (ref 3.5–5.2)
Alkaline Phosphatase: 59 U/L (ref 39–117)
BUN: 19 mg/dL (ref 6–23)
CO2: 29 meq/L (ref 19–32)
Calcium: 9 mg/dL (ref 8.4–10.5)
Chloride: 106 meq/L (ref 96–112)
Creatinine, Ser: 0.81 mg/dL (ref 0.40–1.20)
GFR: 70.22 mL/min (ref >60.00)
Glucose, Bld: 79 mg/dL (ref 70–99)
Potassium: 4.3 meq/L (ref 3.5–5.1)
Sodium: 144 meq/L (ref 135–145)
Total Bilirubin: 0.4 mg/dL (ref 0.2–1.2)
Total Protein: 7.2 g/dL (ref 6.0–8.3)

## 2024-01-04 LAB — MICROALBUMIN / CREATININE URINE RATIO
Creatinine,U: 120.9 mg/dL
Microalb Creat Ratio: 12.9 mg/g (ref 0.0–30.0)
Microalb, Ur: 1.6 mg/dL (ref 0.0–1.9)

## 2024-01-04 LAB — LIPID PANEL
Cholesterol: 178 mg/dL (ref 0–200)
HDL: 47.8 mg/dL (ref >39.00)
LDL Cholesterol: 105 mg/dL — ABNORMAL HIGH (ref 0–99)
NonHDL: 130.49
Total CHOL/HDL Ratio: 4
Triglycerides: 126 mg/dL (ref 0.0–149.0)
VLDL: 25.2 mg/dL (ref 0.0–40.0)

## 2024-01-04 LAB — HEMOGLOBIN A1C: Hgb A1c MFr Bld: 6.2 % (ref 4.6–6.5)

## 2024-01-09 ENCOUNTER — Other Ambulatory Visit: Payer: Self-pay | Admitting: Primary Care

## 2024-01-09 DIAGNOSIS — E1165 Type 2 diabetes mellitus with hyperglycemia: Secondary | ICD-10-CM

## 2024-01-11 ENCOUNTER — Other Ambulatory Visit: Payer: Self-pay | Admitting: Primary Care

## 2024-01-11 DIAGNOSIS — E785 Hyperlipidemia, unspecified: Secondary | ICD-10-CM

## 2024-01-12 DIAGNOSIS — H524 Presbyopia: Secondary | ICD-10-CM | POA: Diagnosis not present

## 2024-01-12 DIAGNOSIS — Z9841 Cataract extraction status, right eye: Secondary | ICD-10-CM | POA: Diagnosis not present

## 2024-01-12 DIAGNOSIS — E119 Type 2 diabetes mellitus without complications: Secondary | ICD-10-CM | POA: Diagnosis not present

## 2024-01-12 DIAGNOSIS — Z9842 Cataract extraction status, left eye: Secondary | ICD-10-CM | POA: Diagnosis not present

## 2024-01-12 LAB — OPHTHALMOLOGY REPORT-SCANNED

## 2024-02-01 ENCOUNTER — Other Ambulatory Visit: Payer: Self-pay | Admitting: Primary Care

## 2024-02-01 DIAGNOSIS — I10 Essential (primary) hypertension: Secondary | ICD-10-CM

## 2024-02-02 MED ORDER — CARVEDILOL 6.25 MG PO TABS: 6.2500 mg | ORAL_TABLET | Freq: Two times a day (BID) | ORAL | 2 refills | Status: AC

## 2024-02-20 ENCOUNTER — Ambulatory Visit: Attending: Family

## 2024-03-18 ENCOUNTER — Other Ambulatory Visit: Payer: Self-pay | Admitting: Family

## 2024-03-18 DIAGNOSIS — I10 Essential (primary) hypertension: Secondary | ICD-10-CM

## 2024-03-19 NOTE — Telephone Encounter (Signed)
 Please call patient:  Received refill request for amlodipine  BP medication. The last refill was for 30 days in July 2025. Has she been taking this medication for BP along with carvedilol ?

## 2024-03-19 NOTE — Telephone Encounter (Signed)
 Lvm asking pt to call back. Pls relay Kate's message and get answer to her question.

## 2024-03-20 NOTE — Telephone Encounter (Signed)
 Left message to return call to our office.  Please provide  message from provider/office when call is returned from patient.

## 2024-03-25 NOTE — Telephone Encounter (Signed)
 Called patient states she is taking but only 1-2 times a week. She forgets a lot. She does need refill to continue.

## 2024-03-25 NOTE — Telephone Encounter (Signed)
Noted. Refill(s) sent to pharmacy.  

## 2024-04-16 ENCOUNTER — Ambulatory Visit: Payer: Self-pay

## 2024-04-16 NOTE — Telephone Encounter (Signed)
 Noted, will evaluate.

## 2024-04-16 NOTE — Telephone Encounter (Signed)
"   FYI Only or Action Required?: FYI only for provider: appointment scheduled on 04/19/24.  Patient was last seen in primary care on 01/03/2024 by Brenda Comer POUR, NP.  Called Nurse Triage reporting Shoulder Pain.  Symptoms began 2 days ago.  Interventions attempted: Ice/heat application and Other: PT exercises.  Symptoms are: stable.  Triage Disposition: See PCP When Office is Open (Within 3 Days)  Patient/caregiver understands and will follow disposition?: Yes   Reason for Triage: Patient was having problem with shoulder and back and received naproxen  from her daughter and it worked.   Pharmacy:  [facility] [address], [place] - [address]  [address] [place] [address]  Phone: [phone] Fax: [phone]  Hours: Not open 24 hours    Reason for Disposition  [1] MODERATE pain (e.g., interferes with normal activities) AND [2] present > 3 days  Answer Assessment - Initial Assessment Questions Patient says that on Sunday morning she had severe pain in her left shoulder that woke her up, 10/10 pain, unable to lift arm, daughter gave her a prescription naproxen  1 pill and she says the pain was relieved. She would like Comer to prescribe that. Advised she will need a visit to discuss the options for pain, she agreed. She says she has seen Comer for the pain in the past and was told she has arthritis, was scheduled PT, but patient cancelled due to her nephew is in PT school and gave her exercises to do. She says the exercises help along with ice and heat rotation. Pain now is a 6. Scheduled for Friday with PCP.   1. ONSET: When did the pain start?     Sunday morning  2. LOCATION: Where is the pain located?     Left shoulder  3. PAIN: How bad is the pain? (Scale 1-10; or mild, moderate, severe)     6  4. WORK OR EXERCISE: Has there been any recent work or exercise that involved this part of the body?     No  5. CAUSE: What do you think is  causing the shoulder pain?     Arthritis  6. OTHER SYMPTOMS: Do you have any other symptoms? (e.g., neck pain, swelling, rash, fever, numbness, weakness)     No  Protocols used: Shoulder Pain-A-AH  "

## 2024-04-19 ENCOUNTER — Ambulatory Visit: Admitting: Primary Care

## 2024-04-19 ENCOUNTER — Ambulatory Visit
Admission: RE | Admit: 2024-04-19 | Discharge: 2024-04-19 | Disposition: A | Source: Ambulatory Visit | Attending: Primary Care

## 2024-04-19 VITALS — BP 140/82 | HR 68 | Temp 98.7°F | Ht 65.5 in | Wt 195.0 lb

## 2024-04-19 DIAGNOSIS — M7502 Adhesive capsulitis of left shoulder: Secondary | ICD-10-CM | POA: Diagnosis not present

## 2024-04-19 DIAGNOSIS — M25551 Pain in right hip: Secondary | ICD-10-CM

## 2024-04-19 DIAGNOSIS — G8929 Other chronic pain: Secondary | ICD-10-CM

## 2024-04-19 DIAGNOSIS — M159 Polyosteoarthritis, unspecified: Secondary | ICD-10-CM | POA: Diagnosis not present

## 2024-04-19 DIAGNOSIS — Z23 Encounter for immunization: Secondary | ICD-10-CM

## 2024-04-19 DIAGNOSIS — M25552 Pain in left hip: Secondary | ICD-10-CM

## 2024-04-19 MED ORDER — GABAPENTIN 300 MG PO CAPS: 300.0000 mg | ORAL_CAPSULE | Freq: Every day | ORAL | 1 refills | Status: AC

## 2024-04-19 MED ORDER — NAPROXEN 500 MG PO TABS: 500.0000 mg | ORAL_TABLET | Freq: Two times a day (BID) | ORAL | 0 refills | Status: AC

## 2024-04-19 MED ORDER — BACLOFEN 10 MG PO TABS: 10.0000 mg | ORAL_TABLET | Freq: Every evening | ORAL | 0 refills | Status: AC | PRN

## 2024-04-19 NOTE — Patient Instructions (Addendum)
 Complete xray(s) prior to leaving today. I will notify you of your results once received.  You may take the baclofen  muscle relaxer at bedtime for muscle spasms  You may take the gabapentin  at bedtime for joint pain.  You may take Naproxen  500 mg twice daily as needed for pain and inflammation.  See you in April!

## 2024-04-19 NOTE — Addendum Note (Signed)
 Addended by: SEBASTIAN SHU on: 04/19/2024 02:44 PM   Modules accepted: Orders

## 2024-04-19 NOTE — Progress Notes (Signed)
 "  Subjective:    Patient ID: Brenda Flores, female    DOB: [birth year], 79 y.o.   MRN: [id number]  Brenda Flores is a very pleasant 79 y.o. female with a history of hypertension, CAD, CHF, type 2 diabetes, osteoarthritis, adhesive capsulitis of left shoulder, lumbar disc disease, chronic hip pain, chronic back pain who presents today to discuss shoulder pain.  Symptom onset 5 days ago with pain to the left shoulder that woke him from sleep.  She was unable to lift her left upper extremity.  She was provided with a naproxen  tablet which helped to relieve her pain.  She discusses that her left shoulder pain has continued since her visit in September 2025. She describes as a dull and deep constant pain.  She has significant decrease in range of motion due to her pain.  She can hardly get dressed in the morning.  She was referred to physical therapy in September 2025 for adhesive capsulitis of the left shoulder.  She canceled the referral as her nephew is in PT school and gave her some exercises to work through at home. She's been doing exercises at home, applied heat and cold, taking Flexeril  and gabapentin  which helps but causes drowsiness.  She would like refills  Overall her pain persists.   Review of Systems       Past Medical History:  Diagnosis Date   Achilles rupture, right    Acute foot pain, left 09/26/2018   Bronchitis    CAD (coronary artery disease) Dr Wonda   non-obs CAD by cath in 7/07 (mLAD 30-40%, EF 65%)   COVID-19 03/23/2022   DDD (degenerative disc disease), lumbar    DJD (degenerative joint disease)    KNEES   GERD (gastroesophageal reflux disease)    History of mitral valve prolapse    PER PT REPORTED MVP  PER DR HARSHAW FROM CARDIAC CATH IN 1987 (APPROX)   History of rectal polyps    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   Hyperlipidemia    Hypertension    Left nephrolithiasis    NON-OBSTRUCTIVE   Left temporal  headache 12/29/2020   Liver, polycystic    Lumbar radiculopathy    Osteoporosis    Pain of right tibia 07/14/2023   PONV (postoperative nausea and vomiting)    Right leg numbness 07/14/2023   Right ureteral stone    Shoulder pain 05/30/2018   Type 2 diabetes mellitus (HCC)     Social History   Socioeconomic History   Marital status: Widowed    Spouse name: Not on file   Number of children: 2   Years of education: Not on file   Highest education level: Not on file  Occupational History   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  Tobacco Use   Smoking status: Never   Smokeless tobacco: Never  Vaping Use   Vaping status: Never Used  Substance and Sexual Activity   Alcohol use: No    Alcohol/week: 0.0 standard drinks of alcohol   Drug use: No   Sexual activity: Not on file  Other Topics Concern   Not on file  Social History Narrative   Not on file   Social Drivers of Health   Tobacco Use: Low Risk (10/16/2023)   Patient History    Smoking Tobacco Use: Never    Smokeless Tobacco Use: Never    Passive Exposure: Not on file  Financial Resource Strain: Low Risk (10/03/2023)   Overall Financial Resource Strain (CARDIA)  Difficulty of Paying Living Expenses: Not hard at all  Food Insecurity: No Food Insecurity (10/03/2023)   Epic    Worried About Programme Researcher, Broadcasting/film/video in the Last Year: Never true    Ran Out of Food in the Last Year: Never true  Transportation Needs: No Transportation Needs (10/03/2023)   Epic    Lack of Transportation (Medical): No    Lack of Transportation (Non-Medical): No  Physical Activity: Sufficiently Active (10/03/2023)   Exercise Vital Sign    Days of Exercise per Week: 5 days    Minutes of Exercise per Session: 150+ min  Stress: No Stress Concern Present (10/03/2023)   [employer] of Occupational Health - Occupational Stress Questionnaire    Feeling of Stress: Not at all  Social Connections: Moderately Isolated (10/03/2023)   Social Connection and Isolation  Panel    Frequency of Communication with Friends and Family: More than three times a week    Frequency of Social Gatherings with Friends and Family: More than three times a week    Attends Religious Services: More than 4 times per year    Active Member of [employer] or Organizations: No    Attends Banker Meetings: Never    Marital Status: Widowed  Intimate Partner Violence: Not At Risk (10/03/2023)   Epic    Fear of Current or Ex-Partner: No    Emotionally Abused: No    Physically Abused: No    Sexually Abused: No  Depression (PHQ2-9): Low Risk (04/19/2024)   Depression (PHQ2-9)    PHQ-2 Score: 0  Alcohol Screen: Low Risk (10/03/2023)   Alcohol Screen    Last Alcohol Screening Score (AUDIT): 0  Housing: Low Risk (10/03/2023)   Epic    Unable to Pay for Housing in the Last Year: No    Number of Times Moved in the Last Year: 0    Homeless in the Last Year: No  Utilities: Not At Risk (10/03/2023)   Epic    Threatened with loss of utilities: No  Health Literacy: Adequate Health Literacy (10/03/2023)   B1300 Health Literacy    Frequency of need for help with medical instructions: Never    Past Surgical History:  Procedure Laterality Date   ACHILLES TENDON SURGERY Right 05/26/2016   Procedure: RIGHT ACHILLES TENDON REPAIR,CALCANEUS SPUR EXCISION;  Surgeon: Toribio JULIANNA Chancy, MD;  Location: [facility];  Service: Orthopedics;  Laterality: Right;   CARDIAC CATHETERIZATION  10-17-2005   DR COOPER   NON-OBSTRUCTIVE CAD/   mLAD 30-40%/   NORMAL LVF/  EF 65%   CARDIOVASCULAR STRESS TEST  04-12-2011  DR DANELLE TAYLOR   LOW RISK STUDY/  NO ISCHEMIA/  EF 68%   CATARACT EXTRACTION W/ INTRAOCULAR LENS IMPLANT Right 2003   COLONOSCOPY W/ POLYPECTOMY  03-17-2011   CYSTOSCOPY WITH URETEROSCOPY Right 06/14/2013   Procedure: RIGHT  URETEROSCOPY,ureteral and urethral dilitation;  Surgeon: Mark C Ottelin, MD;  Location: [facility];  Service: Urology;  Laterality: Right;    HEEL SPUR RESECTION Right 05/26/2016   Procedure: HEEL SPUR EXCISION;  Surgeon: Toribio JULIANNA Chancy, MD;  Location: [facility];  Service: Orthopedics;  Laterality: Right;   HEEL SPUR SURGERY Left 2003   TOTAL ABDOMINAL HYSTERECTOMY W/ BILATERAL SALPINGOOPHORECTOMY  1978   TRANSTHORACIC ECHOCARDIOGRAM  04-07-2011   MILD LVH/  MILD FOCAL BASAL SEPTAL HYPERTROPHY/  EF  55-60% /  GRADE I DIASTOLIC DYSFUNCTION    Family History  Problem Relation Age of Onset  Cancer Mother        breast   Heart disease Mother    Cancer Father        colon   Cancer Brother        ?lung   Heart disease Brother    Heart disease Maternal Aunt    Breast cancer Neg Hx     Allergies[1]  Medications Ordered Prior to Encounter[2]  BP (!) 140/82   Pulse 68   Temp 98.7 F (37.1 C) (Oral)   Ht 5' 5.5 (1.664 m)   Wt 195 lb (88.5 kg)   SpO2 96%   BMI 31.96 kg/m  Objective:   Physical Exam Cardiovascular:     Rate and Rhythm: Normal rate.  Pulmonary:     Effort: Pulmonary effort is normal.  Musculoskeletal:     Left shoulder: Decreased range of motion.     Comments: Decrease in range of motion due to pain with most planes of motion.  Skin:    General: Skin is warm and dry.  Neurological:     Mental Status: She is alert.     Physical Exam        Assessment & Plan:  Adhesive capsulitis of left shoulder Assessment & Plan: Exam today representative.  Given little improvement in her symptoms despite home PT exercises we will obtain plain films of the left shoulder today.  Start naproxen  500 mg twice daily with food. Continue muscle relaxer and gabapentin  as needed.  We also discussed evaluation with our sports medicine physician for steroid injection.  Orders: -     Naproxen ; Take 1 tablet (500 mg total) by mouth 2 (two) times daily with a meal. As needed for pain  Dispense: 60 tablet; Refill: 0 -     Baclofen ; Take 1 tablet (10 mg total) by mouth at bedtime as needed for  muscle spasms.  Dispense: 90 each; Refill: 0 -     Gabapentin ; Take 1 capsule (300 mg total) by mouth at bedtime. For pain  Dispense: 90 capsule; Refill: 1 -     DG Shoulder Left  Osteoarthritis of multiple joints, unspecified osteoarthritis type -     Gabapentin ; Take 1 capsule (300 mg total) by mouth at bedtime. For pain  Dispense: 90 capsule; Refill: 1  Chronic pain of both hips -     Gabapentin ; Take 1 capsule (300 mg total) by mouth at bedtime. For pain  Dispense: 90 capsule; Refill: 1    Assessment and Plan Assessment & Plan         Comer MARLA Gaskins, NP       [1]  Allergies Allergen Reactions   Darvocet [Propoxyphene N-Acetaminophen ] Nausea And Vomiting   Propoxyphene Other (See Comments)   Tetanus Toxoid-Containing Vaccines     TDAP hives, pt reports she had TD in the past w/ no issues  [2]  Current Outpatient Medications on File Prior to Visit  Medication Sig Dispense Refill   albuterol  (PROVENTIL ) (2.5 MG/3ML) 0.083% nebulizer solution Take 3 mLs (2.5 mg total) by nebulization every 6 (six) hours as needed for wheezing or shortness of breath. 150 mL 1   albuterol  (VENTOLIN  HFA) 108 (90 Base) MCG/ACT inhaler Inhale 2 puffs into the lungs every 4 (four) hours as needed for shortness of breath. 1 each 0   amLODipine  (NORVASC ) 5 MG tablet TAKE 1 TABLET BY MOUTH EVERY DAY FOR BLOOD PRESSURE 90 tablet 0   Blood Glucose Monitoring Suppl DEVI 1 each by Does not apply route  in the morning, at noon, and at bedtime. May substitute to any manufacturer covered by patient's insurance. 1 each 0   carvedilol  (COREG ) 6.25 MG tablet Take 1 tablet (6.25 mg total) by mouth 2 (two) times daily with a meal. for blood pressure. 180 tablet 2   glipiZIDE  (GLUCOTROL  XL) 2.5 MG 24 hr tablet TAKE 1 TABLET BY MOUTH DAILY WITH BREAKFAST FOR DIABETES. 90 tablet 1   Glucose Blood (BLOOD GLUCOSE TEST STRIPS) STRP 1 each by In Vitro route in the morning, at noon, and at bedtime. May substitute to  any manufacturer covered by patient's insurance. 200 strip 0   Lancets Misc. MISC 1 each by Does not apply route in the morning, at noon, and at bedtime. May substitute to any manufacturer covered by patient's insurance. 200 each 0   rosuvastatin  (CRESTOR ) 20 MG tablet TAKE 1 TABLET BY MOUTH DAILY. FOR CHOLESTEROL 90 tablet 3   No current facility-administered medications on file prior to visit.   "

## 2024-04-19 NOTE — Assessment & Plan Note (Signed)
 Exam today representative.  Given little improvement in her symptoms despite home PT exercises we will obtain plain films of the left shoulder today.  Start naproxen  500 mg twice daily with food. Continue muscle relaxer and gabapentin  as needed.  We also discussed evaluation with our sports medicine physician for steroid injection.

## 2024-04-22 ENCOUNTER — Ambulatory Visit: Payer: Self-pay | Admitting: Primary Care

## 2024-07-05 ENCOUNTER — Ambulatory Visit: Admitting: Primary Care

## 2024-09-30 ENCOUNTER — Ambulatory Visit

## 2024-10-01 ENCOUNTER — Ambulatory Visit

## 2024-10-03 ENCOUNTER — Ambulatory Visit
